# Patient Record
Sex: Male | Born: 1945 | Race: Black or African American | Hispanic: No | Marital: Single | Smoking: Former smoker
Health system: Southern US, Community
[De-identification: ages and names within clinical notes are randomized; demographics above are authoritative.]

## PROBLEM LIST (undated history)

## (undated) DIAGNOSIS — E119 Type 2 diabetes mellitus without complications: Secondary | ICD-10-CM

## (undated) DIAGNOSIS — E785 Hyperlipidemia, unspecified: Secondary | ICD-10-CM

## (undated) DIAGNOSIS — N189 Chronic kidney disease, unspecified: Secondary | ICD-10-CM

## (undated) DIAGNOSIS — G9340 Encephalopathy, unspecified: Secondary | ICD-10-CM

## (undated) DIAGNOSIS — F32A Depression, unspecified: Secondary | ICD-10-CM

## (undated) DIAGNOSIS — I10 Essential (primary) hypertension: Secondary | ICD-10-CM

## (undated) DIAGNOSIS — F329 Major depressive disorder, single episode, unspecified: Secondary | ICD-10-CM

---

## 2004-08-03 ENCOUNTER — Emergency Department (HOSPITAL_COMMUNITY): Admission: EM | Admit: 2004-08-03 | Discharge: 2004-08-03 | Payer: Self-pay | Admitting: Emergency Medicine

## 2013-04-09 ENCOUNTER — Emergency Department (INDEPENDENT_AMBULATORY_CARE_PROVIDER_SITE_OTHER): Payer: Medicare Other

## 2013-04-09 ENCOUNTER — Encounter (HOSPITAL_COMMUNITY): Payer: Self-pay | Admitting: Emergency Medicine

## 2013-04-09 ENCOUNTER — Emergency Department (HOSPITAL_COMMUNITY)
Admission: EM | Admit: 2013-04-09 | Discharge: 2013-04-09 | Disposition: A | Discharge status: Home / Self Care | Payer: Medicare Other | Source: Home / Self Care | Attending: Family Medicine | Admitting: Family Medicine

## 2013-04-09 DIAGNOSIS — S8000XA Contusion of unspecified knee, initial encounter: Secondary | ICD-10-CM

## 2013-04-09 DIAGNOSIS — S8002XA Contusion of left knee, initial encounter: Secondary | ICD-10-CM
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
## 2015-02-22 ENCOUNTER — Emergency Department (HOSPITAL_COMMUNITY): Payer: Medicare Other

## 2015-02-22 ENCOUNTER — Emergency Department (HOSPITAL_COMMUNITY)
Admission: EM | Admit: 2015-02-22 | Discharge: 2015-02-23 | Disposition: A | Discharge status: Home / Self Care | Payer: Medicare Other | Attending: Emergency Medicine | Admitting: Emergency Medicine

## 2015-02-22 ENCOUNTER — Encounter (HOSPITAL_COMMUNITY): Payer: Self-pay | Admitting: Emergency Medicine

## 2015-02-22 DIAGNOSIS — S2242XA Multiple fractures of ribs, left side, initial encounter for closed fracture: Secondary | ICD-10-CM | POA: Diagnosis not present

## 2015-02-22 DIAGNOSIS — Z791 Long term (current) use of non-steroidal anti-inflammatories (NSAID): Secondary | ICD-10-CM | POA: Diagnosis not present

## 2015-02-22 DIAGNOSIS — Y9389 Activity, other specified: Secondary | ICD-10-CM | POA: Insufficient documentation

## 2015-02-22 DIAGNOSIS — Y998 Other external cause status: Secondary | ICD-10-CM | POA: Diagnosis not present

## 2015-02-22 DIAGNOSIS — S29092A Other injury of muscle and tendon of back wall of thorax, initial encounter: Secondary | ICD-10-CM | POA: Diagnosis present

## 2015-02-22 DIAGNOSIS — Z79899 Other long term (current) drug therapy: Secondary | ICD-10-CM | POA: Diagnosis not present

## 2015-02-22 DIAGNOSIS — S2232XA Fracture of one rib, left side, initial encounter for closed fracture: Secondary | ICD-10-CM

## 2015-02-22 DIAGNOSIS — Y9289 Other specified places as the place of occurrence of the external cause: Secondary | ICD-10-CM | POA: Insufficient documentation

## 2015-02-22 LAB — CBC WITH DIFFERENTIAL/PLATELET
BASOS ABS: 0 10*3/uL (ref 0.0–0.1)
BASOS PCT: 0 % (ref 0–1)
Eosinophils Absolute: 0.1 10*3/uL (ref 0.0–0.7)
Eosinophils Relative: 1 % (ref 0–5)
HEMATOCRIT: 38.8 % — AB (ref 39.0–52.0)
HEMOGLOBIN: 12.5 g/dL — AB (ref 13.0–17.0)
LYMPHS PCT: 10 % — AB (ref 12–46)
Lymphs Abs: 1 10*3/uL (ref 0.7–4.0)
MCH: 29.5 pg (ref 26.0–34.0)
MCHC: 32.2 g/dL (ref 30.0–36.0)
MCV: 91.5 fL (ref 78.0–100.0)
MONO ABS: 0.7 10*3/uL (ref 0.1–1.0)
MONOS PCT: 7 % (ref 3–12)
NEUTROS ABS: 8.5 10*3/uL — AB (ref 1.7–7.7)
NEUTROS PCT: 82 % — AB (ref 43–77)
Platelets: 270 10*3/uL (ref 150–400)
RBC: 4.24 MIL/uL (ref 4.22–5.81)
RDW: 12.9 % (ref 11.5–15.5)
WBC: 10.3 10*3/uL (ref 4.0–10.5)

## 2015-02-22 MED ORDER — FENTANYL CITRATE (PF) 100 MCG/2ML IJ SOLN
50.0000 ug | Freq: Once | INTRAMUSCULAR | Status: AC
  Administered 2015-02-22: 50 ug via INTRAVENOUS
  Filled 2015-02-22: qty 2

## 2015-02-22 MED ORDER — ONDANSETRON HCL 4 MG/2ML IJ SOLN
4.0000 mg | Freq: Once | INTRAMUSCULAR | Status: AC
  Administered 2015-02-22: 4 mg via INTRAVENOUS
  Filled 2015-02-22: qty 2

## 2015-02-22 MED ORDER — IOHEXOL 300 MG/ML  SOLN: 50.0000 mL | Freq: Once | INTRAMUSCULAR | Status: DC | PRN

## 2015-02-22 MED ORDER — SODIUM CHLORIDE 0.9 % IV BOLUS (SEPSIS)
500.0000 mL | Freq: Once | INTRAVENOUS | Status: AC
  Administered 2015-02-22: 500 mL via INTRAVENOUS
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
## 2015-02-23 DIAGNOSIS — S2242XA Multiple fractures of ribs, left side, initial encounter for closed fracture: Secondary | ICD-10-CM | POA: Diagnosis not present

## 2015-02-23 LAB — URINALYSIS, ROUTINE W REFLEX MICROSCOPIC
BILIRUBIN URINE: NEGATIVE
Glucose, UA: 250 mg/dL — AB
HGB URINE DIPSTICK: NEGATIVE
KETONES UR: NEGATIVE mg/dL
Leukocytes, UA: NEGATIVE
Nitrite: NEGATIVE
PH: 5 (ref 5.0–8.0)
PROTEIN: NEGATIVE mg/dL
SPECIFIC GRAVITY, URINE: 1.028 (ref 1.005–1.030)
Urobilinogen, UA: 0.2 mg/dL (ref 0.0–1.0)

## 2015-02-23 LAB — COMPREHENSIVE METABOLIC PANEL
ALBUMIN: 4.3 g/dL (ref 3.5–5.0)
ALK PHOS: 37 U/L — AB (ref 38–126)
ALT: 12 U/L — ABNORMAL LOW (ref 17–63)
ANION GAP: 10 (ref 5–15)
AST: 24 U/L (ref 15–41)
BILIRUBIN TOTAL: 0.6 mg/dL (ref 0.3–1.2)
BUN: 14 mg/dL (ref 6–20)
CALCIUM: 9.3 mg/dL (ref 8.9–10.3)
CO2: 25 mmol/L (ref 22–32)
Chloride: 103 mmol/L (ref 101–111)
Creatinine, Ser: 1.23 mg/dL (ref 0.61–1.24)
GFR, EST NON AFRICAN AMERICAN: 59 mL/min — AB (ref >60)
GLUCOSE: 187 mg/dL — AB (ref 65–99)
Potassium: 4 mmol/L (ref 3.5–5.1)
Sodium: 138 mmol/L (ref 135–145)
Total Protein: 8.1 g/dL (ref 6.5–8.1)

## 2015-02-23 MED ORDER — IOHEXOL 300 MG/ML  SOLN
100.0000 mL | Freq: Once | INTRAMUSCULAR | Status: AC | PRN
  Administered 2015-02-23: 100 mL via INTRAVENOUS

## 2015-02-23 MED ORDER — HYDROCODONE-ACETAMINOPHEN 5-325 MG PO TABS
2.0000 | ORAL_TABLET | Freq: Once | ORAL | Status: AC
  Administered 2015-02-23: 2 via ORAL
  Filled 2015-02-23: qty 2

## 2015-02-23 MED ORDER — HYDROCODONE-ACETAMINOPHEN 5-325 MG PO TABS: 1.0000 | ORAL_TABLET | Freq: Four times a day (QID) | ORAL | Status: DC | PRN

## 2015-02-23 MED ORDER — FENTANYL CITRATE (PF) 100 MCG/2ML IJ SOLN
50.0000 ug | INTRAMUSCULAR | Status: DC | PRN
  Administered 2015-02-23: 50 ug via INTRAVENOUS
  Filled 2015-02-23: qty 2
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
## 2015-02-23 NOTE — Discharge Instructions (Signed)

## 2017-08-15 ENCOUNTER — Emergency Department (HOSPITAL_COMMUNITY)
Admission: EM | Admit: 2017-08-15 | Discharge: 2017-08-15 | Disposition: A | Discharge status: Home / Self Care | Payer: Medicare Other | Attending: Emergency Medicine | Admitting: Emergency Medicine

## 2017-08-15 ENCOUNTER — Emergency Department (HOSPITAL_COMMUNITY): Payer: Medicare Other

## 2017-08-15 DIAGNOSIS — Y999 Unspecified external cause status: Secondary | ICD-10-CM | POA: Insufficient documentation

## 2017-08-15 DIAGNOSIS — S8992XA Unspecified injury of left lower leg, initial encounter: Secondary | ICD-10-CM | POA: Diagnosis not present

## 2017-08-15 DIAGNOSIS — Y939 Activity, unspecified: Secondary | ICD-10-CM | POA: Diagnosis not present

## 2017-08-15 DIAGNOSIS — S8991XA Unspecified injury of right lower leg, initial encounter: Secondary | ICD-10-CM | POA: Diagnosis present

## 2017-08-15 DIAGNOSIS — S79912A Unspecified injury of left hip, initial encounter: Secondary | ICD-10-CM | POA: Diagnosis not present

## 2017-08-15 DIAGNOSIS — Y92410 Unspecified street and highway as the place of occurrence of the external cause: Secondary | ICD-10-CM | POA: Diagnosis not present

## 2017-08-15 DIAGNOSIS — M79606 Pain in leg, unspecified: Secondary | ICD-10-CM

## 2017-08-15 DIAGNOSIS — S79911A Unspecified injury of right hip, initial encounter: Secondary | ICD-10-CM | POA: Diagnosis not present

## 2017-08-15 DIAGNOSIS — W19XXXA Unspecified fall, initial encounter: Secondary | ICD-10-CM

## 2017-08-15 DIAGNOSIS — W010XXA Fall on same level from slipping, tripping and stumbling without subsequent striking against object, initial encounter: Secondary | ICD-10-CM | POA: Diagnosis not present

## 2017-08-15 LAB — CBG MONITORING, ED: GLUCOSE-CAPILLARY: 277 mg/dL — AB (ref 65–99)

## 2017-08-15 MED ORDER — ACETAMINOPHEN 500 MG PO TABS: 500.0000 mg | ORAL_TABLET | Freq: Four times a day (QID) | ORAL | 0 refills | Status: DC | PRN
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
## 2017-08-15 NOTE — ED Notes (Signed)
Patient transported to X-ray 
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
## 2017-08-15 NOTE — Discharge Instructions (Signed)
Your imaging was reassuring today and did not show any evidence of broken bone/fracture or dislocation.  We recommend icing to areas of pain to limit swelling.  Take Tylenol as needed for pain control.  You may follow-up with a primary care doctor to ensure resolution of symptoms.  Return to the emergency department, as needed, for new or concerning symptoms.

## 2017-09-02 ENCOUNTER — Encounter (HOSPITAL_COMMUNITY): Payer: Self-pay | Admitting: Emergency Medicine

## 2017-09-02 ENCOUNTER — Other Ambulatory Visit: Payer: Self-pay

## 2017-09-02 ENCOUNTER — Emergency Department (HOSPITAL_COMMUNITY)
Admission: EM | Admit: 2017-09-02 | Discharge: 2017-09-02 | Disposition: A | Discharge status: Home / Self Care | Payer: Medicare Other | Attending: Emergency Medicine | Admitting: Emergency Medicine

## 2017-09-02 DIAGNOSIS — S01511A Laceration without foreign body of lip, initial encounter: Secondary | ICD-10-CM | POA: Insufficient documentation

## 2017-09-02 DIAGNOSIS — Y939 Activity, unspecified: Secondary | ICD-10-CM | POA: Insufficient documentation

## 2017-09-02 DIAGNOSIS — Z7984 Long term (current) use of oral hypoglycemic drugs: Secondary | ICD-10-CM | POA: Insufficient documentation

## 2017-09-02 DIAGNOSIS — Y929 Unspecified place or not applicable: Secondary | ICD-10-CM | POA: Diagnosis not present

## 2017-09-02 DIAGNOSIS — W19XXXA Unspecified fall, initial encounter: Secondary | ICD-10-CM | POA: Diagnosis not present

## 2017-09-02 DIAGNOSIS — Z87891 Personal history of nicotine dependence: Secondary | ICD-10-CM | POA: Diagnosis not present

## 2017-09-02 DIAGNOSIS — Z79899 Other long term (current) drug therapy: Secondary | ICD-10-CM | POA: Insufficient documentation

## 2017-09-02 DIAGNOSIS — Y998 Other external cause status: Secondary | ICD-10-CM | POA: Diagnosis not present

## 2017-09-02 DIAGNOSIS — R739 Hyperglycemia, unspecified: Secondary | ICD-10-CM | POA: Diagnosis not present

## 2017-09-02 LAB — I-STAT CHEM 8, ED
BUN: 11 mg/dL (ref 6–20)
CREATININE: 1 mg/dL (ref 0.61–1.24)
Calcium, Ion: 1.11 mmol/L — ABNORMAL LOW (ref 1.15–1.40)
Chloride: 101 mmol/L (ref 101–111)
Glucose, Bld: 349 mg/dL — ABNORMAL HIGH (ref 65–99)
HEMATOCRIT: 40 % (ref 39.0–52.0)
Hemoglobin: 13.6 g/dL (ref 13.0–17.0)
Potassium: 3.7 mmol/L (ref 3.5–5.1)
SODIUM: 140 mmol/L (ref 135–145)
TCO2: 27 mmol/L (ref 22–32)

## 2017-09-02 LAB — CBG MONITORING, ED
Glucose-Capillary: 453 mg/dL — ABNORMAL HIGH (ref 65–99)
Glucose-Capillary: 332 mg/dL — ABNORMAL HIGH (ref 65–99)

## 2017-09-02 LAB — ETHANOL: Alcohol, Ethyl (B): <10 mg/dL (ref <10)

## 2017-09-02 MED ORDER — SODIUM CHLORIDE 0.9 % IV BOLUS (SEPSIS)
1000.0000 mL | Freq: Once | INTRAVENOUS | Status: AC
  Administered 2017-09-02: 1000 mL via INTRAVENOUS

## 2017-09-02 MED ORDER — ENALAPRILAT 1.25 MG/ML IV SOLN
1.2500 mg | Freq: Once | INTRAVENOUS | Status: AC
  Administered 2017-09-02: 1.25 mg via INTRAVENOUS
  Filled 2017-09-02: qty 1

## 2017-09-02 NOTE — Progress Notes (Signed)
CSW met with pt. Pt stated he has been staying at the [facility]. CSW called the [facility], they have no record of him staying there and have no available beds.   Wendelyn Breslow, [facility]  [phone]

## 2017-09-02 NOTE — ED Provider Notes (Signed)
[facility] EMERGENCY DEPARTMENT Provider Note   CSN: [id number] Arrival date & time: 09/02/17  4098     History   Chief Complaint Chief Complaint  Patient presents with  . Fall  . Lip Laceration  . Hyperglycemia    HPI Richard Powers is a 72 y.o. male.C hief complaint is fall, lip laceration.  HPI:  Pt fell outside of [facility] tonight. Recently evicted form apartment. Lip laceration. No additional injury. Pt likely not compliant with medications, as he is unable to outline dosages and meds for me.  History reviewed. No pertinent past medical history.  There are no active problems to display for this patient.   History reviewed. No pertinent surgical history.     Home Medications    Prior to Admission medications   Medication Sig Start Date End Date Taking? Authorizing Provider  acetaminophen (TYLENOL) 500 MG tablet Take 1 tablet (500 mg total) by mouth every 6 (six) hours as needed. 08/15/17  Yes Antony Madura, PA-C  hydrochlorothiazide (HYDRODIURIL) 25 MG tablet Take 25 mg by mouth daily.   Yes [provider]  ibuprofen (ADVIL,MOTRIN) 200 MG tablet Take 400 mg by mouth every 6 (six) hours as needed for headache, mild pain or moderate pain.   Yes [provider]  lisinopril (PRINIVIL,ZESTRIL) 40 MG tablet Take 40 mg by mouth daily.   Yes [provider]  metFORMIN (GLUCOPHAGE) 500 MG tablet Take 1,000 mg by mouth 2 (two) times daily with a meal.    Yes [provider]  nabumetone (RELAFEN) 750 MG tablet Take 750 mg by mouth 2 (two) times daily. 01/20/15  Yes [provider]  simvastatin (ZOCOR) 40 MG tablet Take 40 mg by mouth daily.   Yes [provider]  sitaGLIPtin (JANUVIA) 25 MG tablet Take 25 mg by mouth daily.   Yes [provider]    Family History History reviewed. No pertinent family history.  Social History Social History   Tobacco Use  . Smoking status: Former Games developer  .  Smokeless tobacco: Never Used  Substance Use Topics  . Alcohol use: Yes  . Drug use: No     Allergies   Patient has no known allergies.   Review of Systems Review of Systems  Constitutional: Negative for appetite change, chills, diaphoresis, fatigue and fever.  HENT: Negative for mouth sores, sore throat and trouble swallowing.   Eyes: Negative for visual disturbance.  Respiratory: Negative for cough, chest tightness, shortness of breath and wheezing.   Cardiovascular: Negative for chest pain.  Gastrointestinal: Negative for abdominal distention, abdominal pain, diarrhea, nausea and vomiting.  Endocrine: Negative for polydipsia, polyphagia and polyuria.  Genitourinary: Negative for dysuria, frequency and hematuria.  Musculoskeletal: Negative for gait problem.  Skin: Positive for wound. Negative for color change, pallor and rash.  Neurological: Negative for dizziness, syncope, light-headedness and headaches.  Hematological: Does not bruise/bleed easily.  Psychiatric/Behavioral: Negative for behavioral problems and confusion.     Physical Exam Updated Vital Signs BP (!) 194/88   Pulse (!) 53   Temp 99.4 F (37.4 C) (Oral)   Resp 18   Ht 5\' 7"  (1.702 m)   Wt 77.1 kg (170 lb)   SpO2 94%   BMI 26.63 kg/m   Physical Exam  Constitutional: He is oriented to person, place, and time. He appears well-developed and well-nourished. No distress.  HENT:  Head: Normocephalic.   Superficial upper and lower lip laceratios  Eyes: Conjunctivae are normal. Pupils are equal, round,  and reactive to light. No scleral icterus.  Neck: Normal range of motion. Neck supple. No thyromegaly present.  Cardiovascular: Normal rate and regular rhythm. Exam reveals no gallop and no friction rub.  No murmur heard. Pulmonary/Chest: Effort normal and breath sounds normal. No respiratory distress. He has no wheezes. He has no rales.  Abdominal: Soft. Bowel sounds are normal. He exhibits no distension.  There is no tenderness. There is no rebound.  Musculoskeletal: Normal range of motion.  Neurological: He is alert and oriented to person, place, and time.  Skin: Skin is warm and dry. No rash noted.  Psychiatric: He has a normal mood and affect. His behavior is normal.     ED Treatments / Results  Labs (all labs ordered are listed, but only abnormal results are displayed) Labs Reviewed  CBG MONITORING, ED - Abnormal; Notable for the following components:      Result Value   Glucose-Capillary 453 (*)    All other components within normal limits  I-STAT CHEM 8, ED - Abnormal; Notable for the following components:   Glucose, Bld 349 (*)    Calcium, Ion 1.11 (*)    All other components within normal limits  CBG MONITORING, ED - Abnormal; Notable for the following components:   Glucose-Capillary 332 (*)    All other components within normal limits  ETHANOL    EKG  EKG Interpretation None       Radiology No results found.  Procedures Procedures (including critical care time)  Medications Ordered in ED Medications  sodium chloride 0.9 % bolus 1,000 mL (0 mLs Intravenous Stopped 09/02/17 2100)  enalaprilat (VASOTEC) injection 1.25 mg (1.25 mg Intravenous Given 09/02/17 1948)     Initial Impression / Assessment and Plan / ED Course  I have reviewed the triage vital signs and the nursing notes.  Pertinent labs & imaging results that were available during my care of the patient were reviewed by me and considered in my medical decision making (see chart for details).    Hyperglycemic and hypertensive. Better after fluids and meds. Appropriate for Dc.  Final Clinical Impressions(s) / ED Diagnoses   Final diagnoses:  Lip laceration, initial encounter    ED Discharge Orders    None       Rolland PorterJames, Kiwanna Spraker, MD 09/02/17 2319

## 2017-09-02 NOTE — Progress Notes (Signed)
CSW met with pt. Pt called [facility]. [facility] accepted pt over the phone. CSW provided pt with a taxi voucher to return to [place].   Wendelyn Breslow, [facility]  [phone]

## 2017-09-02 NOTE — ED Notes (Signed)
Voicemail for emergency contact left for patient pick up in waiting room.

## 2017-09-02 NOTE — Discharge Instructions (Signed)
Continue your medications. Follow up with your physician
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
## 2017-09-03 ENCOUNTER — Emergency Department (HOSPITAL_COMMUNITY): Payer: Medicare Other

## 2017-09-03 ENCOUNTER — Encounter (HOSPITAL_COMMUNITY): Payer: Self-pay | Admitting: Emergency Medicine

## 2017-09-03 ENCOUNTER — Emergency Department (HOSPITAL_COMMUNITY)
Admission: EM | Admit: 2017-09-03 | Discharge: 2017-09-03 | Disposition: A | Discharge status: Home / Self Care | Payer: Medicare Other | Attending: Physician Assistant | Admitting: Physician Assistant

## 2017-09-03 DIAGNOSIS — Z79899 Other long term (current) drug therapy: Secondary | ICD-10-CM | POA: Diagnosis not present

## 2017-09-03 DIAGNOSIS — Y9389 Activity, other specified: Secondary | ICD-10-CM | POA: Insufficient documentation

## 2017-09-03 DIAGNOSIS — W19XXXA Unspecified fall, initial encounter: Secondary | ICD-10-CM

## 2017-09-03 DIAGNOSIS — R531 Weakness: Secondary | ICD-10-CM | POA: Insufficient documentation

## 2017-09-03 DIAGNOSIS — Y998 Other external cause status: Secondary | ICD-10-CM | POA: Insufficient documentation

## 2017-09-03 DIAGNOSIS — Z87891 Personal history of nicotine dependence: Secondary | ICD-10-CM | POA: Diagnosis not present

## 2017-09-03 DIAGNOSIS — W01198A Fall on same level from slipping, tripping and stumbling with subsequent striking against other object, initial encounter: Secondary | ICD-10-CM | POA: Diagnosis not present

## 2017-09-03 DIAGNOSIS — Y9289 Other specified places as the place of occurrence of the external cause: Secondary | ICD-10-CM | POA: Insufficient documentation

## 2017-09-03 LAB — CBC WITH DIFFERENTIAL/PLATELET
BASOS ABS: 0 10*3/uL (ref 0.0–0.1)
BASOS PCT: 0 %
Eosinophils Absolute: 0 10*3/uL (ref 0.0–0.7)
Eosinophils Relative: 0 %
HEMATOCRIT: 39.5 % (ref 39.0–52.0)
HEMOGLOBIN: 12.9 g/dL — AB (ref 13.0–17.0)
Lymphocytes Relative: 8 %
Lymphs Abs: 0.6 10*3/uL — ABNORMAL LOW (ref 0.7–4.0)
MCH: 28.9 pg (ref 26.0–34.0)
MCHC: 32.7 g/dL (ref 30.0–36.0)
MCV: 88.4 fL (ref 78.0–100.0)
Monocytes Absolute: 1 10*3/uL (ref 0.1–1.0)
Monocytes Relative: 13 %
NEUTROS ABS: 6.2 10*3/uL (ref 1.7–7.7)
NEUTROS PCT: 79 %
Platelets: 221 10*3/uL (ref 150–400)
RBC: 4.47 MIL/uL (ref 4.22–5.81)
RDW: 12.8 % (ref 11.5–15.5)
WBC: 7.9 10*3/uL (ref 4.0–10.5)

## 2017-09-03 LAB — COMPREHENSIVE METABOLIC PANEL
ALK PHOS: 50 U/L (ref 38–126)
ALT: 16 U/L — ABNORMAL LOW (ref 17–63)
ANION GAP: 8 (ref 5–15)
AST: 26 U/L (ref 15–41)
Albumin: 3.4 g/dL — ABNORMAL LOW (ref 3.5–5.0)
BILIRUBIN TOTAL: 0.9 mg/dL (ref 0.3–1.2)
BUN: 7 mg/dL (ref 6–20)
CALCIUM: 9.6 mg/dL (ref 8.9–10.3)
CO2: 25 mmol/L (ref 22–32)
Chloride: 104 mmol/L (ref 101–111)
Creatinine, Ser: 1.19 mg/dL (ref 0.61–1.24)
GFR, EST NON AFRICAN AMERICAN: 60 mL/min — AB (ref >60)
Glucose, Bld: 312 mg/dL — ABNORMAL HIGH (ref 65–99)
Potassium: 3.5 mmol/L (ref 3.5–5.1)
Sodium: 137 mmol/L (ref 135–145)
TOTAL PROTEIN: 6.6 g/dL (ref 6.5–8.1)

## 2017-09-03 LAB — CBG MONITORING, ED: Glucose-Capillary: 308 mg/dL — ABNORMAL HIGH (ref 65–99)

## 2017-09-03 NOTE — Discharge Instructions (Signed)
Please be careful walking around.  We are glad that your labs and CT are normal.
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
## 2017-09-03 NOTE — ED Triage Notes (Signed)
[facility] after being d/c from this facility by cab. Per EMS bystanders state pt fell twice attempting to walk from cab to doors and is unable to stand without falling back. Pt is A& O to norm.  EMS unable to est IV

## 2017-09-03 NOTE — ED Notes (Signed)
Pt ambulated with a steady gait and did not show any signs of unsteady gait. Ambulated without assistance to the bathroom without difficulty. Given a bus pass.

## 2017-09-05 ENCOUNTER — Encounter: Payer: Self-pay | Admitting: Pediatric Intensive Care

## 2017-09-09 ENCOUNTER — Encounter: Payer: Self-pay | Admitting: Pediatric Intensive Care

## 2017-09-09 ENCOUNTER — Emergency Department (HOSPITAL_COMMUNITY): Payer: Medicare Other

## 2017-09-09 ENCOUNTER — Encounter (HOSPITAL_COMMUNITY): Payer: Self-pay

## 2017-09-09 ENCOUNTER — Inpatient Hospital Stay (HOSPITAL_COMMUNITY): Payer: Medicare Other

## 2017-09-09 ENCOUNTER — Inpatient Hospital Stay (HOSPITAL_COMMUNITY)
Admission: EM | Admit: 2017-09-09 | Discharge: 2017-09-13 | DRG: 872 | Disposition: A | Discharge status: Home / Self Care | Payer: Medicare Other | Attending: Internal Medicine | Admitting: Internal Medicine

## 2017-09-09 DIAGNOSIS — Z7984 Long term (current) use of oral hypoglycemic drugs: Secondary | ICD-10-CM

## 2017-09-09 DIAGNOSIS — Z791 Long term (current) use of non-steroidal anti-inflammatories (NSAID): Secondary | ICD-10-CM | POA: Diagnosis not present

## 2017-09-09 DIAGNOSIS — E876 Hypokalemia: Secondary | ICD-10-CM | POA: Diagnosis not present

## 2017-09-09 DIAGNOSIS — M1990 Unspecified osteoarthritis, unspecified site: Secondary | ICD-10-CM | POA: Diagnosis not present

## 2017-09-09 DIAGNOSIS — E872 Acidosis: Secondary | ICD-10-CM | POA: Diagnosis present

## 2017-09-09 DIAGNOSIS — Z87891 Personal history of nicotine dependence: Secondary | ICD-10-CM | POA: Diagnosis not present

## 2017-09-09 DIAGNOSIS — Z590 Homelessness: Secondary | ICD-10-CM

## 2017-09-09 DIAGNOSIS — Z79899 Other long term (current) drug therapy: Secondary | ICD-10-CM

## 2017-09-09 DIAGNOSIS — A4189 Other specified sepsis: Principal | ICD-10-CM | POA: Diagnosis present

## 2017-09-09 DIAGNOSIS — E785 Hyperlipidemia, unspecified: Secondary | ICD-10-CM | POA: Diagnosis present

## 2017-09-09 DIAGNOSIS — E869 Volume depletion, unspecified: Secondary | ICD-10-CM | POA: Diagnosis present

## 2017-09-09 DIAGNOSIS — J101 Influenza due to other identified influenza virus with other respiratory manifestations: Secondary | ICD-10-CM | POA: Diagnosis not present

## 2017-09-09 DIAGNOSIS — E119 Type 2 diabetes mellitus without complications: Secondary | ICD-10-CM | POA: Diagnosis not present

## 2017-09-09 DIAGNOSIS — R6510 Systemic inflammatory response syndrome (SIRS) of non-infectious origin without acute organ dysfunction: Secondary | ICD-10-CM

## 2017-09-09 DIAGNOSIS — N179 Acute kidney failure, unspecified: Secondary | ICD-10-CM | POA: Diagnosis not present

## 2017-09-09 DIAGNOSIS — J208 Acute bronchitis due to other specified organisms: Secondary | ICD-10-CM | POA: Diagnosis present

## 2017-09-09 DIAGNOSIS — A419 Sepsis, unspecified organism: Secondary | ICD-10-CM | POA: Diagnosis present

## 2017-09-09 DIAGNOSIS — R69 Illness, unspecified: Secondary | ICD-10-CM

## 2017-09-09 DIAGNOSIS — I10 Essential (primary) hypertension: Secondary | ICD-10-CM

## 2017-09-09 DIAGNOSIS — E118 Type 2 diabetes mellitus with unspecified complications: Secondary | ICD-10-CM

## 2017-09-09 DIAGNOSIS — J111 Influenza due to unidentified influenza virus with other respiratory manifestations: Secondary | ICD-10-CM

## 2017-09-09 DIAGNOSIS — E1129 Type 2 diabetes mellitus with other diabetic kidney complication: Secondary | ICD-10-CM

## 2017-09-09 HISTORY — DX: Essential (primary) hypertension: I10

## 2017-09-09 HISTORY — DX: Type 2 diabetes mellitus without complications: E11.9

## 2017-09-09 LAB — I-STAT CG4 LACTIC ACID, ED
Lactic Acid, Venous: 3.09 mmol/L (ref 0.5–1.9)
Lactic Acid, Venous: 1.97 mmol/L — ABNORMAL HIGH (ref 0.5–1.9)

## 2017-09-09 LAB — URINALYSIS, ROUTINE W REFLEX MICROSCOPIC
Bilirubin Urine: NEGATIVE
KETONES UR: 5 mg/dL — AB
Leukocytes, UA: NEGATIVE
Nitrite: NEGATIVE
PROTEIN: 30 mg/dL — AB
Specific Gravity, Urine: 1.029 (ref 1.005–1.030)
pH: 5 (ref 5.0–8.0)

## 2017-09-09 LAB — CBC WITH DIFFERENTIAL/PLATELET
Basophils Absolute: 0 10*3/uL (ref 0.0–0.1)
Basophils Relative: 0 %
Eosinophils Absolute: 0 10*3/uL (ref 0.0–0.7)
Eosinophils Relative: 0 %
HCT: 37.5 % — ABNORMAL LOW (ref 39.0–52.0)
Hemoglobin: 12.3 g/dL — ABNORMAL LOW (ref 13.0–17.0)
LYMPHS ABS: 0.5 10*3/uL — AB (ref 0.7–4.0)
Lymphocytes Relative: 6 %
MCH: 29.1 pg (ref 26.0–34.0)
MCHC: 32.8 g/dL (ref 30.0–36.0)
MCV: 88.9 fL (ref 78.0–100.0)
MONO ABS: 0.5 10*3/uL (ref 0.1–1.0)
Monocytes Relative: 6 %
NEUTROS ABS: 7.7 10*3/uL (ref 1.7–7.7)
Neutrophils Relative %: 88 %
PLATELETS: ADEQUATE 10*3/uL (ref 150–400)
RBC: 4.22 MIL/uL (ref 4.22–5.81)
RDW: 12.6 % (ref 11.5–15.5)
WBC: 8.7 10*3/uL (ref 4.0–10.5)

## 2017-09-09 LAB — COMPREHENSIVE METABOLIC PANEL
ALK PHOS: 50 U/L (ref 38–126)
ALT: 15 U/L — ABNORMAL LOW (ref 17–63)
ANION GAP: 11 (ref 5–15)
AST: 26 U/L (ref 15–41)
Albumin: 3.1 g/dL — ABNORMAL LOW (ref 3.5–5.0)
BILIRUBIN TOTAL: 0.9 mg/dL (ref 0.3–1.2)
BUN: 14 mg/dL (ref 6–20)
CALCIUM: 8.7 mg/dL — AB (ref 8.9–10.3)
CO2: 24 mmol/L (ref 22–32)
CREATININE: 1.33 mg/dL — AB (ref 0.61–1.24)
Chloride: 100 mmol/L — ABNORMAL LOW (ref 101–111)
GFR calc non Af Amer: 52 mL/min — ABNORMAL LOW (ref >60)
Glucose, Bld: 357 mg/dL — ABNORMAL HIGH (ref 65–99)
Potassium: 3.9 mmol/L (ref 3.5–5.1)
Sodium: 135 mmol/L (ref 135–145)
TOTAL PROTEIN: 6.8 g/dL (ref 6.5–8.1)

## 2017-09-09 LAB — PROTIME-INR
INR: 1.33
Prothrombin Time: 16.3 seconds — ABNORMAL HIGH (ref 11.4–15.2)

## 2017-09-09 LAB — CBG MONITORING, ED: Glucose-Capillary: 364 mg/dL — ABNORMAL HIGH (ref 65–99)

## 2017-09-09 LAB — GLUCOSE, POCT (MANUAL RESULT ENTRY): POC GLUCOSE: 308 mg/dL — AB (ref 70–99)

## 2017-09-09 MED ORDER — ONDANSETRON HCL 4 MG PO TABS: 4.0000 mg | ORAL_TABLET | Freq: Four times a day (QID) | ORAL | Status: DC | PRN

## 2017-09-09 MED ORDER — SODIUM CHLORIDE 0.9 % IV BOLUS (SEPSIS)
1000.0000 mL | Freq: Once | INTRAVENOUS | Status: AC
  Administered 2017-09-09: 1000 mL via INTRAVENOUS

## 2017-09-09 MED ORDER — SODIUM CHLORIDE 0.9% FLUSH
3.0000 mL | Freq: Two times a day (BID) | INTRAVENOUS | Status: DC
  Administered 2017-09-10 – 2017-09-13 (×5): 3 mL via INTRAVENOUS

## 2017-09-09 MED ORDER — DEXTROSE 5 % IV SOLN
1.0000 g | INTRAVENOUS | Status: DC
  Administered 2017-09-10 – 2017-09-12 (×3): 1 g via INTRAVENOUS
  Filled 2017-09-09 (×4): qty 10

## 2017-09-09 MED ORDER — GUAIFENESIN ER 600 MG PO TB12
600.0000 mg | ORAL_TABLET | Freq: Two times a day (BID) | ORAL | Status: DC
  Administered 2017-09-09 – 2017-09-13 (×8): 600 mg via ORAL
  Filled 2017-09-09 (×8): qty 1

## 2017-09-09 MED ORDER — SIMVASTATIN 40 MG PO TABS
40.0000 mg | ORAL_TABLET | Freq: Every day | ORAL | Status: DC
  Administered 2017-09-10 (×2): 40 mg via ORAL
  Filled 2017-09-09 (×2): qty 1

## 2017-09-09 MED ORDER — DEXTROSE 5 % IV SOLN
500.0000 mg | INTRAVENOUS | Status: DC
  Administered 2017-09-09: 500 mg via INTRAVENOUS
  Filled 2017-09-09: qty 500

## 2017-09-09 MED ORDER — OSELTAMIVIR PHOSPHATE 75 MG PO CAPS
75.0000 mg | ORAL_CAPSULE | Freq: Once | ORAL | Status: AC
  Administered 2017-09-09: 75 mg via ORAL
  Filled 2017-09-09: qty 1

## 2017-09-09 MED ORDER — INSULIN ASPART 100 UNIT/ML IV SOLN: 10.0000 [IU] | Freq: Once | INTRAVENOUS | Status: DC

## 2017-09-09 MED ORDER — HYDROCHLOROTHIAZIDE 25 MG PO TABS
25.0000 mg | ORAL_TABLET | Freq: Every day | ORAL | Status: DC
  Administered 2017-09-10 – 2017-09-12 (×3): 25 mg via ORAL
  Filled 2017-09-09 (×3): qty 1

## 2017-09-09 MED ORDER — DEXTROSE 5 % IV SOLN: 500.0000 mg | Freq: Once | INTRAVENOUS | Status: DC

## 2017-09-09 MED ORDER — DEXTROSE 5 % IV SOLN: 1.0000 g | Freq: Once | INTRAVENOUS | Status: DC

## 2017-09-09 MED ORDER — SODIUM CHLORIDE 0.9 % IV SOLN
1000.0000 mL | INTRAVENOUS | Status: DC
  Administered 2017-09-09 – 2017-09-11 (×6): 1000 mL via INTRAVENOUS

## 2017-09-09 MED ORDER — DEXTROSE 5 % IV SOLN
1.0000 g | INTRAVENOUS | Status: DC
  Administered 2017-09-09: 1 g via INTRAVENOUS
  Filled 2017-09-09: qty 10

## 2017-09-09 MED ORDER — HYDRALAZINE HCL 20 MG/ML IJ SOLN
10.0000 mg | Freq: Three times a day (TID) | INTRAMUSCULAR | Status: DC | PRN
  Administered 2017-09-10: 10 mg via INTRAVENOUS

## 2017-09-09 MED ORDER — ACETAMINOPHEN 325 MG PO TABS
650.0000 mg | ORAL_TABLET | Freq: Once | ORAL | Status: AC
  Administered 2017-09-09: 650 mg via ORAL
  Filled 2017-09-09: qty 2

## 2017-09-09 MED ORDER — ONDANSETRON HCL 4 MG/2ML IJ SOLN: 4.0000 mg | Freq: Four times a day (QID) | INTRAMUSCULAR | Status: DC | PRN

## 2017-09-09 MED ORDER — AZITHROMYCIN 500 MG PO TABS
500.0000 mg | ORAL_TABLET | ORAL | Status: DC
  Administered 2017-09-10 – 2017-09-12 (×3): 500 mg via ORAL
  Filled 2017-09-09 (×3): qty 1

## 2017-09-09 MED ORDER — ALBUTEROL SULFATE (2.5 MG/3ML) 0.083% IN NEBU: 2.5000 mg | INHALATION_SOLUTION | RESPIRATORY_TRACT | Status: DC | PRN

## 2017-09-09 MED ORDER — DEXTROSE 5 % IV SOLN: 1.0000 g | INTRAVENOUS | Status: DC

## 2017-09-09 MED ORDER — INSULIN ASPART 100 UNIT/ML ~~LOC~~ SOLN
0.0000 [IU] | Freq: Three times a day (TID) | SUBCUTANEOUS | Status: DC
Start: 2017-09-10 — End: 2017-09-13
  Administered 2017-09-10 (×2): 3 [IU] via SUBCUTANEOUS
  Administered 2017-09-11: 2 [IU] via SUBCUTANEOUS
  Administered 2017-09-11: 3 [IU] via SUBCUTANEOUS
  Administered 2017-09-11: 5 [IU] via SUBCUTANEOUS
  Administered 2017-09-12: 8 [IU] via SUBCUTANEOUS
  Administered 2017-09-12 – 2017-09-13 (×2): 3 [IU] via SUBCUTANEOUS
  Administered 2017-09-13: 5 [IU] via SUBCUTANEOUS

## 2017-09-09 MED ORDER — ENOXAPARIN SODIUM 40 MG/0.4ML ~~LOC~~ SOLN
40.0000 mg | Freq: Every day | SUBCUTANEOUS | Status: DC
  Administered 2017-09-10 – 2017-09-13 (×4): 40 mg via SUBCUTANEOUS
  Filled 2017-09-09 (×4): qty 0.4

## 2017-09-09 MED ORDER — IPRATROPIUM-ALBUTEROL 0.5-2.5 (3) MG/3ML IN SOLN
3.0000 mL | Freq: Two times a day (BID) | RESPIRATORY_TRACT | Status: DC
  Administered 2017-09-09: 3 mL via RESPIRATORY_TRACT
  Filled 2017-09-09: qty 3

## 2017-09-09 MED ORDER — LISINOPRIL 40 MG PO TABS
40.0000 mg | ORAL_TABLET | Freq: Every day | ORAL | Status: DC
  Administered 2017-09-10 – 2017-09-13 (×4): 40 mg via ORAL
  Filled 2017-09-09 (×4): qty 1

## 2017-09-09 MED ORDER — ACETAMINOPHEN 650 MG RE SUPP: 650.0000 mg | Freq: Four times a day (QID) | RECTAL | Status: DC | PRN

## 2017-09-09 MED ORDER — ACETAMINOPHEN 325 MG PO TABS: 650.0000 mg | ORAL_TABLET | Freq: Four times a day (QID) | ORAL | Status: DC | PRN

## 2017-09-09 NOTE — ED Notes (Signed)
Delay in lab draw,  Pt not in room at this time. 

## 2017-09-09 NOTE — ED Triage Notes (Signed)
[facility]. Unaware of LNW. Pt was found by bystanders attempting to clean himself in the bathroom. Pt was  unable to stand up on his own.

## 2017-09-09 NOTE — H&P (Signed)
[facility] History and Physical  Gwendlyn DeutscherWilliam Yale ZOX:[id number] DOB: May 26, 1946 DOA: 09/09/2017  Referring physician:  PCP: System, Pcp Not In   Chief Complaint: "I just want to get well."    HPI: Gwendlyn DeutscherWilliam Manas is a 72 y.o. male with past medical history significant for hypertension diabetes presents to emergency room with chief complaint of feeling ill.  Patient was brought in by EMS after bystanders reported patient having upper behavior.  Very weak. Unable to stand.  ED Course: Sepsis protocol. Start on abx. Cultures drawn. Hospitalist consulted for admission.   Review of Systems:  As per HPI otherwise 10 point review of systems negative.    Past Medical History:  Diagnosis Date  . Diabetes mellitus without complication (HCC)   . Hypertension    History reviewed. No pertinent surgical history. Social History:  reports that he has quit smoking. he has never used smokeless tobacco. He reports that he drinks alcohol. He reports that he does not use drugs.  No Known Allergies  No family history on file.   Prior to Admission medications   Medication Sig Start Date End Date Taking? Authorizing Provider  acetaminophen (TYLENOL) 500 MG tablet Take 1 tablet (500 mg total) by mouth every 6 (six) hours as needed. 08/15/17   Antony MaduraHumes, Kelly, PA-C  hydrochlorothiazide (HYDRODIURIL) 25 MG tablet Take 25 mg by mouth daily.    [provider]  ibuprofen (ADVIL,MOTRIN) 200 MG tablet Take 400 mg by mouth every 6 (six) hours as needed for headache, mild pain or moderate pain.    [provider]  lisinopril (PRINIVIL,ZESTRIL) 40 MG tablet Take 40 mg by mouth daily.    [provider]  metFORMIN (GLUCOPHAGE) 500 MG tablet Take 1,000 mg by mouth 2 (two) times daily with a meal.     [provider]  nabumetone (RELAFEN) 750 MG tablet Take 750 mg by mouth 2 (two) times daily. 01/20/15   [provider]  simvastatin (ZOCOR) 40 MG tablet Take 40 mg by  mouth daily.    [provider]  sitaGLIPtin (JANUVIA) 25 MG tablet Take 25 mg by mouth daily.    [provider]   Physical Exam: Vitals:   09/09/17 2100 09/09/17 2115 09/09/17 2130 09/09/17 2132  BP:    (!) 152/114  Pulse: 75 (!) 124 79 (!) 127  Resp: 17 (!) 8 (!) 23 (!) 31  Temp:      TempSrc:      SpO2: 94% 94% 94% 95%  Weight:      Height:        Wt Readings from Last 3 Encounters:  09/09/17 79.4 kg (175 lb)  09/02/17 77.1 kg (170 lb)    General:  Appears calm and comfortable; A&Ox2 Eyes:  PERRL, EOMI, normal lids, iris ENT:  grossly normal hearing, lips & tongue Neck:  no LAD, masses or thyromegaly Cardiovascular:  RRR, no m/r/g. No LE edema.  Respiratory:  Crackles, sparse and diffuse, nl wob Abdomen:  soft, ntnd Skin:  no rash or induration seen on limited exam Musculoskeletal:  grossly normal tone BUE/BLE Psychiatric:  grossly normal mood and affect, speech fluent and appropriate Neurologic:  CN 2-12 grossly intact, moves all extremities in coordinated fashion.          Labs on Admission:  Basic Metabolic Panel: Recent Labs  Lab 09/03/17 0456 09/09/17 2000  NA 137 135  K 3.5 3.9  CL 104 100*  CO2 25 24  GLUCOSE 312* 357*  BUN 7  14  CREATININE 1.19 1.33*  CALCIUM 9.6 8.7*   Liver Function Tests: Recent Labs  Lab 09/03/17 0456 09/09/17 2000  AST 26 26  ALT 16* 15*  ALKPHOS 50 50  BILITOT 0.9 0.9  PROT 6.6 6.8  ALBUMIN 3.4* 3.1*   No results for input(s): LIPASE, AMYLASE in the last 168 hours. No results for input(s): AMMONIA in the last 168 hours. CBC: Recent Labs  Lab 09/03/17 0456 09/09/17 2000  WBC 7.9 8.7  NEUTROABS 6.2 7.7  HGB 12.9* 12.3*  HCT 39.5 37.5*  MCV 88.4 88.9  PLT 221 PLATELET CLUMPS NOTED ON SMEAR, COUNT APPEARS ADEQUATE   Cardiac Enzymes: No results for input(s): CKTOTAL, CKMB, CKMBINDEX, TROPONINI in the last 168 hours.  BNP (last 3 results) No results for input(s): BNP in the last 8760  hours.  ProBNP (last 3 results) No results for input(s): PROBNP in the last 8760 hours.   Serum creatinine: 1.33 mg/dL (H) 29/52/84 1324 Estimated creatinine clearance: 51.4 mL/min (A)  CBG: Recent Labs  Lab 09/03/17 0115  GLUCAP 308*    Radiological Exams on Admission: Dg Chest 2 View  Result Date: 09/09/2017 CLINICAL DATA:  Altered mental status and hypertension EXAM: CHEST  2 VIEW COMPARISON:  Rib x-ray February 22, 2015 FINDINGS: The aorta is tortuous and probably dilated. The heart size is mildly enlarged. Both lungs are clear. Old posttraumatic changes of multiple left ribs are identified. IMPRESSION: No active cardiopulmonary disease. Electronically Signed   By: Sherian Rein M.D.   On: 09/09/2017 20:38    EKG: Independently reviewed. Tachycardia. No STEMI.  Assessment/Plan Principal Problem:   Sepsis (HCC) Active Problems:   Essential hypertension   DM II (diabetes mellitus, type II), controlled (HCC)   Sepsis 2/2 unk resp Patient hemodynamically stable Given azithro emergency room, will continue Given rocephin in the emergency room, will continue Urine culture pending Blood cultures 2 pending Patient given 1000 mL of fluid in the emergency room Lactic acid up Flu pending  Hypertension When necessary hydralazine 10 mg IV as needed for severe blood pressure Cont HCTZ, lisinopril  DM SSI AC Hold metformin & januvia  OA Hold Nambumetone  Hyperlipidemia Continue statin   Code Status: FC  DVT Prophylaxis: lovenox Family Communication: none available Disposition Plan: Pending Improvement  Status: tele inpt  Haydee Salter, MD Family Medicine [facility] [email or web address] Password [email or web address]

## 2017-09-09 NOTE — ED Notes (Signed)
No IV access at this time.

## 2017-09-09 NOTE — ED Notes (Signed)
Two IV attempts made by this nurse

## 2017-09-09 NOTE — Progress Notes (Signed)
Pharmacy Antibiotic Note  Richard DeutscherWilliam Powers is a 72 y.o. male admitted on 09/09/2017 with pneumonia.  Pharmacy has been consulted for ceftriaxone/azithromycin dosing. Tmax  103.4.  Plan: Ceftriaxone 1g IV q24h Azithromycin 500mg  IV q24h F/u clinical progress, LOT Not renally adjusted - Rx will sign off consults     Temp (24hrs), Avg:103.4 F (39.7 C), Min:103.4 F (39.7 C), Max:103.4 F (39.7 C)  Recent Labs  Lab 09/02/17 2108 09/03/17 0456 09/09/17 2016  WBC  --  7.9  --   CREATININE 1.00 1.19  --   LATICACIDVEN  --   --  3.09*    Estimated Creatinine Clearance: 53.2 mL/min (by C-G formula based on SCr of 1.19 mg/dL).    No Known Allergies   Richard Powers, PharmD, BCPS Clinical Pharmacist 09/09/2017 8:26 PM

## 2017-09-09 NOTE — ED Notes (Signed)
Code Sepsis activated 2028  (RN aware)

## 2017-09-09 NOTE — ED Notes (Signed)
Patient transported to X-ray 

## 2017-09-09 NOTE — ED Notes (Signed)
Spoke to MD about pt CBG

## 2017-09-09 NOTE — ED Provider Notes (Signed)
Medical screening examination/treatment/procedure(s) were conducted as a shared visit with non-physician practitioner(s) and myself.  I personally evaluated the patient during the encounter.   EKG Interpretation  Date/Time:  Friday September 09 2017 19:51:30 EST Ventricular Rate:  130 PR Interval:    QRS Duration: 94 QT Interval:  327 QTC Calculation: 437 R Axis:   91 Text Interpretation:  Sinus tachycardia Ventricular bigeminy Right axis deviation Low voltage, precordial leads Borderline T abnormalities, inferior leads no previous Confirmed by Vanetta MuldersZackowski, Oseias Horsey [phone] on 09/09/2017 8:56:23 PM       Patient seen by me along with physician assistant.  [facility] in from homeless [facility].  For altered mental status.  Patient was found by bystanders attempting to clean himself in the bathroom.  Was able to stand on his own.  Patient febrile and tachycardic.  Patient was seen January fourth.  And also seen December 17.  Patient's vital signs consistent with concerns for sepsis.  Sepsis protocol initiated.  Patient tachycardic febrile to temp to 103.  Patient is confused but is alert.  Chest x-ray negative for pneumonia however clinically the concern is that may be the area of infection.  Patient started on sepsis protocol also got fluids and protocol antibiotics for pneumonia.  Patient's blood sugar was elevated but he will be getting IV fluids for the sepsis.  We will hold off on insulin at this time.   CRITICAL CARE Performed by: Vanetta MuldersZACKOWSKI,Joyice Magda Total critical care time: 30 minutes Critical care time was exclusive of separately billable procedures and treating other patients. Critical care was necessary to treat or prevent imminent or life-threatening deterioration. Critical care was time spent personally by me on the following activities: development of treatment plan with patient and/or surrogate as well as nursing, discussions with consultants, evaluation of patient's response to  treatment, examination of patient, obtaining history from patient or surrogate, ordering and performing treatments and interventions, ordering and review of laboratory studies, ordering and review of radiographic studies, pulse oximetry and re-evaluation of patient's condition.    Vanetta MuldersZackowski, Zamyiah Tino, MD 09/09/17 2108

## 2017-09-09 NOTE — ED Notes (Signed)
Two attempts made by this nurse

## 2017-09-09 NOTE — ED Notes (Signed)
Unsuccessful IV start x2.  

## 2017-09-09 NOTE — ED Provider Notes (Signed)
[facility]   CSN: [id number] Arrival date & time: 09/09/17  1931     History   Chief Complaint Chief Complaint  Patient presents with  . Altered Mental Status    HPI Richard Powers is a 72 y.o. male.  Level V caveat: AMS   Richard Powers is a 72 y.o. Male who presents to the ED via EMS with altered mental status.  Patient was found by bystanders in a bathroom where he was trying to clean himself.  He was unable to stand on his own.  Patient history.  He does tell me he has had a cough.  He tells me his pain where the nurse is currently putting in an IV.  He denies other complaints.  He denies any chest pain or abdominal pain.  He denies any vomiting or diarrhea.  He is unsure if he has had fevers.   The history is provided by the patient, the EMS personnel and medical records. No language interpreter was used.  Altered Mental Status      Past Medical History:  Diagnosis Date  . Diabetes mellitus without complication (HCC)   . Hypertension     Patient Active Problem List   Diagnosis Date Noted  . Sepsis (HCC) 09/09/2017  . Essential hypertension 09/09/2017  . DM II (diabetes mellitus, type II), controlled (HCC) 09/09/2017    History reviewed. No pertinent surgical history.     Home Medications    Prior to Admission medications   Medication Sig Start Date End Date Taking? Authorizing Provider  acetaminophen (TYLENOL) 500 MG tablet Take 1 tablet (500 mg total) by mouth every 6 (six) hours as needed. 08/15/17  Yes Antony MaduraHumes, Kelly, PA-C  hydrochlorothiazide (HYDRODIURIL) 25 MG tablet Take 25 mg by mouth daily.   Yes [provider]  ibuprofen (ADVIL,MOTRIN) 200 MG tablet Take 400 mg by mouth every 6 (six) hours as needed for headache, mild pain or moderate pain.   Yes [provider]  lisinopril (PRINIVIL,ZESTRIL) 40 MG tablet Take 40 mg by mouth daily.   Yes [provider]  metFORMIN  (GLUCOPHAGE) 500 MG tablet Take 1,000 mg by mouth 2 (two) times daily with a meal.    Yes [provider]  nabumetone (RELAFEN) 750 MG tablet Take 750 mg by mouth 2 (two) times daily. 01/20/15  Yes [provider]  simvastatin (ZOCOR) 40 MG tablet Take 40 mg by mouth daily.   Yes [provider]  sitaGLIPtin (JANUVIA) 25 MG tablet Take 25 mg by mouth daily.   Yes [provider]    Family History No family history on file.  Social History Social History   Tobacco Use  . Smoking status: Former Games developermoker  . Smokeless tobacco: Never Used  Substance Use Topics  . Alcohol use: Yes  . Drug use: No     Allergies   Patient has no known allergies.   Review of Systems Review of Systems  Unable to perform ROS: Mental status change     Physical Exam Updated Vital Signs BP (!) 148/88   Pulse (!) 55   Temp 99.1 F (37.3 C) (Oral)   Resp (!) 22   Ht 5\' 7"  (1.702 m)   Wt 79.4 kg (175 lb)   SpO2 94%   BMI 27.41 kg/m   Physical Exam  Constitutional: He appears well-developed and well-nourished. No distress.  HENT:  Head: Normocephalic and atraumatic.  Mouth/Throat: Oropharynx is clear and moist.  Eyes: Conjunctivae are normal. Pupils are equal, round, and reactive to light. Right eye exhibits no discharge. Left eye exhibits no discharge.  Neck: Neck supple.  Cardiovascular: Regular rhythm, normal heart sounds and intact distal pulses. Exam reveals no gallop and no friction rub.  No murmur heard. HR 124 on exam.   Pulmonary/Chest: Effort normal. No respiratory distress. He has no wheezes.  Crackles and diminished to bilateral bases. Coughing during exam. No increased work of breathing.   Abdominal: Soft. He exhibits no mass. There is no tenderness. There is no guarding.  Abdomen is soft and non-tender to palpation.   Musculoskeletal: He exhibits no edema.  No LE edema or TTP.   Lymphadenopathy:    He has no cervical adenopathy.  Neurological:  He is alert. Coordination normal.  Patient alert and oriented to person. His speech is clear and coherent. He is able to tell me the correct president, but keeps repeating his date of birth when I ask him the month and year. He is unsure where he is. Patient is spontaneously moving all extremities in a coordinated fashion exhibiting good strength.   Skin: Skin is warm and dry. Capillary refill takes less than 2 seconds. No rash noted. He is not diaphoretic. No erythema. No pallor.  Psychiatric: He has a normal mood and affect. His behavior is normal.  Nursing note and vitals reviewed.    ED Treatments / Results  Labs (all labs ordered are listed, but only abnormal results are displayed) Labs Reviewed  COMPREHENSIVE METABOLIC PANEL - Abnormal; Notable for the following components:      Result Value   Chloride 100 (*)    Glucose, Bld 357 (*)    Creatinine, Ser 1.33 (*)    Calcium 8.7 (*)    Albumin 3.1 (*)    ALT 15 (*)    GFR calc non Af Amer 52 (*)    All other components within normal limits  CBC WITH DIFFERENTIAL/PLATELET - Abnormal; Notable for the following components:   Hemoglobin 12.3 (*)    HCT 37.5 (*)    Lymphs Abs 0.5 (*)    All other components within normal limits  PROTIME-INR - Abnormal; Notable for the following components:   Prothrombin Time 16.3 (*)    All other components within normal limits  URINALYSIS, ROUTINE W REFLEX MICROSCOPIC - Abnormal; Notable for the following components:   Glucose, UA >=500 (*)    Hgb urine dipstick MODERATE (*)    Ketones, ur 5 (*)    Protein, ur 30 (*)    Bacteria, UA RARE (*)    Squamous Epithelial / LPF 0-5 (*)    All other components within normal limits  I-STAT CG4 LACTIC ACID, ED - Abnormal; Notable for the following components:   Lactic Acid, Venous 3.09 (*)    All other components within normal limits  I-STAT CG4 LACTIC ACID, ED - Abnormal; Notable for the following components:   Lactic Acid, Venous 1.97 (*)    All  other components within normal limits  CULTURE, BLOOD (ROUTINE X 2)  CULTURE, BLOOD (ROUTINE X 2)  INFLUENZA PANEL BY PCR (TYPE A & B)  LACTIC ACID, PLASMA  LACTIC ACID, PLASMA    EKG  EKG Interpretation  Date/Time:  Friday September 09 2017 19:51:30 EST Ventricular Rate:  130 PR Interval:    QRS Duration: 94 QT Interval:  327 QTC Calculation: 437 R Axis:   91 Text Interpretation:  Sinus tachycardia Ventricular bigeminy Right axis deviation Low  voltage, precordial leads Borderline T abnormalities, inferior leads no previous Confirmed by Vanetta Mulders [phone]) on 09/09/2017 8:56:23 PM       Radiology Dg Chest 2 View  Result Date: 09/09/2017 CLINICAL DATA:  Altered mental status and hypertension EXAM: CHEST  2 VIEW COMPARISON:  Rib x-ray February 22, 2015 FINDINGS: The aorta is tortuous and probably dilated. The heart size is mildly enlarged. Both lungs are clear. Old posttraumatic changes of multiple left ribs are identified. IMPRESSION: No active cardiopulmonary disease. Electronically Signed   By: Sherian Rein M.D.   On: 09/09/2017 20:38    Procedures Procedures (including critical care time)  CRITICAL CARE Performed by: Lawana Chambers   Total critical care time: 35  minutes  Critical care time was exclusive of separately billable procedures and treating other patients.  Critical care was necessary to treat or prevent imminent or life-threatening deterioration.  Critical care was time spent personally by me on the following activities: development of treatment plan with patient and/or surrogate as well as nursing, discussions with consultants, evaluation of patient's response to treatment, examination of patient, obtaining history from patient or surrogate, ordering and performing treatments and interventions, ordering and review of laboratory studies, ordering and review of radiographic studies, pulse oximetry and re-evaluation of patient's condition.   Medications  Ordered in ED Medications  0.9 %  sodium chloride infusion (1,000 mLs Intravenous New Bag/Given 09/09/17 2137)  azithromycin (ZITHROMAX) 500 mg in dextrose 5 % 250 mL IVPB (500 mg Intravenous New Bag/Given 09/09/17 2127)  cefTRIAXone (ROCEPHIN) 1 g in dextrose 5 % 50 mL IVPB (0 g Intravenous Stopped 09/09/17 2230)  oseltamivir (TAMIFLU) capsule 75 mg (not administered)  ipratropium-albuterol (DUONEB) 0.5-2.5 (3) MG/3ML nebulizer solution 3 mL (not administered)  albuterol (PROVENTIL) (2.5 MG/3ML) 0.083% nebulizer solution 2.5 mg (not administered)  guaiFENesin (MUCINEX) 12 hr tablet 600 mg (not administered)  hydrALAZINE (APRESOLINE) injection 10 mg (not administered)  insulin aspart (novoLOG) injection 0-15 Units (not administered)  sodium chloride 0.9 % bolus 1,000 mL (0 mLs Intravenous Stopped 09/09/17 2230)  acetaminophen (TYLENOL) tablet 650 mg (650 mg Oral Given 09/09/17 2044)     Initial Impression / Assessment and Plan / ED Course  I have reviewed the triage vital signs and the nursing notes.  Pertinent labs & imaging results that were available during my care of the patient were reviewed by me and considered in my medical decision making (see chart for details).    This is a 72 y.o. Male who presents to the ED via EMS with altered mental status.  Patient was found by bystanders in a bathroom where he was trying to clean himself.  He was unable to stand on his own.  Patient history.  He does tell me he has had a cough.  He tells me his pain where the nurse is currently putting in an IV.  He denies other complaints.  He denies any chest pain or abdominal pain.  He denies any vomiting or diarrhea.  He is unsure if he has had fevers.  On exam the patient is nontoxic-appearing.  Temperature is 103.  He is tachycardic with a heart rate of 124 on my exam.  Diminished lung sounds bilaterally with crackles bilaterally.  No increased work of breathing.  No hypoxia.  His abdomen is soft and  nontender.  His speech is clear and coherent.  He is confused about the date and events leading up to his visit today. Code sepsis activated on my initial evaluation.  Lactic acid is 3.09 initially. Suspect pneumonia with his coughing versus influenza illness.  Flu by PCR ordered.  Will initiate community-acquired pneumonia treatment with Rocephin and azithromycin at this time.  Awaiting additional blood work and chest x-ray. CMP is remarkable for hyperglycemia with a glucose of 357.  Normal anion gap.  Creatinine of 1.33. Urinalysis is nitrite and leukocyte negative.  Low suspicion for UTI. CBC with a white count of 8700.  Chest x-ray is unremarkable.  Question influenza.  Will start Tamiflu.  Plan for admission. At reevaluation patient's heart rate is improving its down to 118 on reevaluation.  I explained to patient that he would be coming into the hospital. Will consult for admission  I consulted with Dr. Melynda Ripple from [facility] who accepted the patient for admission.   This patient was discussed with and evaluated by Dr. Deretha Emory who agrees with assessment and plan.   Final Clinical Impressions(s) / ED Diagnoses   Final diagnoses:  Sepsis, due to unspecified organism [facility])  Influenza-like illness    ED Discharge Orders    None       Amiel, Mccaffrey, PA-C 09/09/17 2236    Vanetta Mulders, MD 09/09/17 2350

## 2017-09-10 DIAGNOSIS — E869 Volume depletion, unspecified: Secondary | ICD-10-CM | POA: Diagnosis not present

## 2017-09-10 DIAGNOSIS — R6510 Systemic inflammatory response syndrome (SIRS) of non-infectious origin without acute organ dysfunction: Secondary | ICD-10-CM | POA: Diagnosis not present

## 2017-09-10 DIAGNOSIS — A4189 Other specified sepsis: Secondary | ICD-10-CM | POA: Diagnosis not present

## 2017-09-10 DIAGNOSIS — N179 Acute kidney failure, unspecified: Secondary | ICD-10-CM | POA: Diagnosis not present

## 2017-09-10 DIAGNOSIS — A419 Sepsis, unspecified organism: Secondary | ICD-10-CM | POA: Diagnosis not present

## 2017-09-10 DIAGNOSIS — R69 Illness, unspecified: Secondary | ICD-10-CM | POA: Diagnosis not present

## 2017-09-10 LAB — CBC
HCT: 36.9 % — ABNORMAL LOW (ref 39.0–52.0)
Hemoglobin: 12.3 g/dL — ABNORMAL LOW (ref 13.0–17.0)
MCH: 29.8 pg (ref 26.0–34.0)
MCHC: 33.3 g/dL (ref 30.0–36.0)
MCV: 89.3 fL (ref 78.0–100.0)
PLATELETS: 226 10*3/uL (ref 150–400)
RBC: 4.13 MIL/uL — ABNORMAL LOW (ref 4.22–5.81)
RDW: 12.5 % (ref 11.5–15.5)
WBC: 9.1 10*3/uL (ref 4.0–10.5)

## 2017-09-10 LAB — BASIC METABOLIC PANEL
Anion gap: 10 (ref 5–15)
BUN: 11 mg/dL (ref 6–20)
CO2: 25 mmol/L (ref 22–32)
CREATININE: 1.18 mg/dL (ref 0.61–1.24)
Calcium: 8.1 mg/dL — ABNORMAL LOW (ref 8.9–10.3)
Chloride: 102 mmol/L (ref 101–111)
GLUCOSE: 302 mg/dL — AB (ref 65–99)
Potassium: 3.4 mmol/L — ABNORMAL LOW (ref 3.5–5.1)
Sodium: 137 mmol/L (ref 135–145)

## 2017-09-10 LAB — INFLUENZA PANEL BY PCR (TYPE A & B)
INFLAPCR: POSITIVE — AB
INFLBPCR: NEGATIVE

## 2017-09-10 LAB — GLUCOSE, CAPILLARY
GLUCOSE-CAPILLARY: 154 mg/dL — AB (ref 65–99)
GLUCOSE-CAPILLARY: 385 mg/dL — AB (ref 65–99)
Glucose-Capillary: 116 mg/dL — ABNORMAL HIGH (ref 65–99)
Glucose-Capillary: 151 mg/dL — ABNORMAL HIGH (ref 65–99)

## 2017-09-10 LAB — STREP PNEUMONIAE URINARY ANTIGEN: Strep Pneumo Urinary Antigen: NEGATIVE

## 2017-09-10 LAB — PROCALCITONIN: Procalcitonin: 8.74 ng/mL

## 2017-09-10 LAB — LACTIC ACID, PLASMA
LACTIC ACID, VENOUS: 2.6 mmol/L — AB (ref 0.5–1.9)
Lactic Acid, Venous: 2.1 mmol/L (ref 0.5–1.9)

## 2017-09-10 MED ORDER — OSELTAMIVIR PHOSPHATE 75 MG PO CAPS
75.0000 mg | ORAL_CAPSULE | Freq: Two times a day (BID) | ORAL | Status: DC
  Administered 2017-09-10 – 2017-09-13 (×7): 75 mg via ORAL
  Filled 2017-09-10 (×4): qty 1
  Filled 2017-09-10: qty 2
  Filled 2017-09-10 (×2): qty 1

## 2017-09-10 MED ORDER — SODIUM CHLORIDE 0.9 % IV BOLUS (SEPSIS)
500.0000 mL | Freq: Once | INTRAVENOUS | Status: AC
  Administered 2017-09-10: 500 mL via INTRAVENOUS

## 2017-09-10 MED ORDER — IPRATROPIUM-ALBUTEROL 0.5-2.5 (3) MG/3ML IN SOLN
3.0000 mL | Freq: Two times a day (BID) | RESPIRATORY_TRACT | Status: DC
  Administered 2017-09-10 – 2017-09-11 (×3): 3 mL via RESPIRATORY_TRACT
  Filled 2017-09-10 (×3): qty 3

## 2017-09-10 MED ORDER — POTASSIUM CHLORIDE CRYS ER 20 MEQ PO TBCR
40.0000 meq | EXTENDED_RELEASE_TABLET | Freq: Once | ORAL | Status: AC
  Administered 2017-09-10: 40 meq via ORAL
  Filled 2017-09-10: qty 2

## 2017-09-10 MED ORDER — INSULIN ASPART 100 UNIT/ML ~~LOC~~ SOLN
10.0000 [IU] | Freq: Once | SUBCUTANEOUS | Status: AC
  Administered 2017-09-10: 10 [IU] via SUBCUTANEOUS

## 2017-09-10 MED ORDER — OSELTAMIVIR PHOSPHATE 30 MG PO CAPS
30.0000 mg | ORAL_CAPSULE | Freq: Two times a day (BID) | ORAL | Status: DC
  Filled 2017-09-10: qty 1

## 2017-09-10 NOTE — Progress Notes (Signed)
CRITICAL VALUE ALERT  Critical Value:  Lactic Acid 2.1   Date & Time Notied:  09/10/17   Provider Notified: Bodenhiemer  Orders Received/Actions taken: 500 ml bolus

## 2017-09-10 NOTE — Progress Notes (Signed)
PROGRESS NOTE    Richard Powers  YNW:[id number] DOB: 1946-08-19 DOA: 09/09/2017 PCP: System, Pcp Not In  Outpatient Specialists:    Brief Narrative: Patient is a 72 y.o. male with past medical history significant for hypertension and diabetes mellitus. Patient is a very poor historian. Patient was admitted with SIRS/Sepsis (as per documentation), and influenza is noted to be positive. Patient was volume deplete on admission, with associated AKI, likely pre renal that has resolved with hydration. Patient has been started on Tamiflu.   Assessment & Plan:   Principal Problem:   Sepsis/SIRS   Influenza A Active Problems:   Volume depletion   AKI, likely pre renal   Essential hypertension   DM II (diabetes mellitus, type II), controlled (HCC)   Hypokalemia   Poor historian   Tamiflu Complete work up Continue antibiotics IV Hydration Monitor electrolyte and renal function Replete potassium   DVT prophylaxis: [place] Lovenox Code Status: Full Family Communication:  Disposition Plan: Home eventually  Consultants:   None  Procedures:   None  Antimicrobials:   Tamiflu  Rocephin  Azithromycin    Subjective: Poor historian  Objective: Vitals:   09/10/17 0946 09/10/17 1605 09/10/17 1816 09/10/17 2026  BP:  (!) 161/77 (!) 182/91   Pulse:  70    Resp:  18    Temp:  98.9 F (37.2 C)    TempSrc:  Oral    SpO2: 94% 100%  99%  Weight:      Height:        Intake/Output Summary (Last 24 hours) at 09/10/2017 2117 Last data filed at 09/10/2017 2053 Gross per 24 hour  Intake 2726.25 ml  Output 1800 ml  Net 926.25 ml   Filed Weights   09/09/17 2039 09/10/17 0603  Weight: 79.4 kg (175 lb) 73.4 kg (161 lb 13.1 oz)    Examination:  General exam: Appears calm and comfortable  Respiratory system: Clear to auscultation.   Cardiovascular system: S1 & S2. Gastrointestinal system: Abdomen is nondistended, soft and nontender. No organomegaly or masses felt. Normal bowel  sounds heard. Central nervous system: Alert and oriented. No focal neurological deficits. Extremities: Symmetric 5 x 5 power.  Data Reviewed: I have personally reviewed following labs and imaging studies  CBC: Recent Labs  Lab 09/09/17 2000 09/10/17 0216  WBC 8.7 9.1  NEUTROABS 7.7  --   HGB 12.3* 12.3*  HCT 37.5* 36.9*  MCV 88.9 89.3  PLT PLATELET CLUMPS NOTED ON SMEAR, COUNT APPEARS ADEQUATE 226   Basic Metabolic Panel: Recent Labs  Lab 09/09/17 2000 09/10/17 0216  NA 135 137  K 3.9 3.4*  CL 100* 102  CO2 24 25  GLUCOSE 357* 302*  BUN 14 11  CREATININE 1.33* 1.18  CALCIUM 8.7* 8.1*   GFR: Estimated Creatinine Clearance: 53.7 mL/min (by C-G formula based on SCr of 1.18 mg/dL). Liver Function Tests: Recent Labs  Lab 09/09/17 2000  AST 26  ALT 15*  ALKPHOS 50  BILITOT 0.9  PROT 6.8  ALBUMIN 3.1*   No results for input(s): LIPASE, AMYLASE in the last 168 hours. No results for input(s): AMMONIA in the last 168 hours. Coagulation Profile: Recent Labs  Lab 09/09/17 2000  INR 1.33   Cardiac Enzymes: No results for input(s): CKTOTAL, CKMB, CKMBINDEX, TROPONINI in the last 168 hours. BNP (last 3 results) No results for input(s): PROBNP in the last 8760 hours. HbA1C: No results for input(s): HGBA1C in the last 72 hours. CBG: Recent Labs  Lab 09/09/17 2233 09/10/17  0041 09/10/17 0742 09/10/17 1226 09/10/17 1639  GLUCAP 364* 385* 116* 154* 151*   Lipid Profile: No results for input(s): CHOL, HDL, LDLCALC, TRIG, CHOLHDL, LDLDIRECT in the last 72 hours. Thyroid Function Tests: No results for input(s): TSH, T4TOTAL, FREET4, T3FREE, THYROIDAB in the last 72 hours. Anemia Panel: No results for input(s): VITAMINB12, FOLATE, FERRITIN, TIBC, IRON, RETICCTPCT in the last 72 hours. Urine analysis:    Component Value Date/Time   COLORURINE YELLOW 09/09/2017 2008   APPEARANCEUR CLEAR 09/09/2017 2008   LABSPEC 1.029 09/09/2017 2008   PHURINE 5.0 09/09/2017  2008   GLUCOSEU >=500 (A) 09/09/2017 2008   HGBUR MODERATE (A) 09/09/2017 2008   BILIRUBINUR NEGATIVE 09/09/2017 2008   KETONESUR 5 (A) 09/09/2017 2008   PROTEINUR 30 (A) 09/09/2017 2008   UROBILINOGEN 0.2 02/22/2015 2331   NITRITE NEGATIVE 09/09/2017 2008   LEUKOCYTESUR NEGATIVE 09/09/2017 2008   Sepsis Labs: @LABRCNTIP (procalcitonin:4,lacticidven:4)  ) Recent Results (from the past 240 hour(s))  Culture, blood (Routine x 2)     Status: None (Preliminary result)   Collection Time: 09/09/17  8:21 PM  Result Value Ref Range Status   Specimen Description BLOOD BLOOD RIGHT HAND  Final   Special Requests AEROBIC BOTTLE ONLY Blood Culture adequate volume  Final   Culture NO GROWTH < 24 HOURS  Final   Report Status PENDING  Incomplete  Culture, blood (Routine x 2)     Status: None (Preliminary result)   Collection Time: 09/09/17  9:15 PM  Result Value Ref Range Status   Specimen Description BLOOD BLOOD RIGHT HAND  Final   Special Requests AEROBIC BOTTLE ONLY Blood Culture adequate volume  Final   Culture NO GROWTH < 24 HOURS  Final   Report Status PENDING  Incomplete         Radiology Studies: Dg Chest 2 View  Result Date: 09/09/2017 CLINICAL DATA:  Altered mental status and hypertension EXAM: CHEST  2 VIEW COMPARISON:  Rib x-ray February 22, 2015 FINDINGS: The aorta is tortuous and probably dilated. The heart size is mildly enlarged. Both lungs are clear. Old posttraumatic changes of multiple left ribs are identified. IMPRESSION: No active cardiopulmonary disease. Electronically Signed   By: Sherian Rein M.D.   On: 09/09/2017 20:38   Ct Head Wo Contrast  Result Date: 09/10/2017 CLINICAL DATA:  Acute confusion EXAM: CT HEAD WITHOUT CONTRAST TECHNIQUE: Contiguous axial images were obtained from the base of the skull through the vertex without intravenous contrast. COMPARISON:  09/03/2017 FINDINGS: Brain: No acute territorial infarction, hemorrhage or intracranial mass is visualized.  Mild to moderate atrophy. Fairly extensive small vessel ischemic changes of the white matter. 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 Vascular: No hyperdense vessels.  Carotid artery calcification Skull: Normal. Negative for fracture or focal lesion. Sinuses/Orbits: Mucosal thickening in the ethmoid and maxillary sinuses. No acute orbital abnormality. Other: None IMPRESSION: No CT evidence for acute intracranial abnormality. Atrophy and small vessel ischemic changes of the white matter Electronically Signed   By: Jasmine Pang M.D.   On: 09/10/2017 00:06        Scheduled Meds: . azithromycin  500 mg Oral Q24H  . enoxaparin (LOVENOX) injection  40 mg Subcutaneous Daily  . guaiFENesin  600 mg Oral BID  . hydrochlorothiazide  25 mg Oral Daily  . insulin aspart  0-15 Units Subcutaneous TID WC  . ipratropium-albuterol  3 mL Nebulization BID  . lisinopril  40 mg Oral Daily  . oseltamivir  75 mg Oral BID  .  simvastatin  40 mg Oral QHS  . sodium chloride flush  3 mL Intravenous Q12H   Continuous Infusions: . sodium chloride 1,000 mL (09/10/17 1835)  . cefTRIAXone (ROCEPHIN)  IV 1 g (09/10/17 2055)     LOS: 1 day    Time spent: 1840 Minutes    Berton MountSylvester Daleysa Kristiansen, MD  [facility] Pager #: [phone] 7PM-7AM contact night coverage as above

## 2017-09-10 NOTE — Progress Notes (Addendum)
Met with patient to discuss if patient would be interested in participating in a clinical research study. Patient eligibilty for study currently under review. Discussed study with patient and consent left at bedside.   Rock, Wells Guiles A  Title: A Randomized, Double-Blind, Placebo-Controlled Dose Ranging Study Evaluating the Safety Pharmacokinetics and Clinical Benefit of FLU-IGIV in Hospitalized Patients with Serious Influenza A infection. IA-001 (ClinicalTrials.gov Identifier: [id number], Protocol No: IA-001, [facility] Protocol #[id number])  RESEARCH SUBJECT. This research study is sponsored by [employer]   Protocol: amendment 4 -> 20 Jan 2017  The investigational product is called NP-025 aka FLU-IGIV or anti-influenza immune globulin intravenous. It is produced from source plasma collected from [place] Transport planner (FDA) licensed plasma collection establishments from healthy donors who have recovered from influenza (convalescent) and/or were vaccinated against seasonal influenza strains. The plasma contains a relatively high concentration of polyclonal antibodies directed against seasonal influenza strains, specifically influenza A strains H1N1 ([place]) and H3N2 ([place]). It is a glycoprotein of 150-160 kilodaltons against Hemagluttinin (HA) and Neuraminidase (NA) surface proteins.    ................ PI OVERSIGHT ATTESTATION  I the Principal Investigator (PI) for the above mentioned study attest that I reviewed the above mentioned clinical research nurse) notes on research subject  Jacaden Forbush  born [birth year] . I  agree with the findings mentioned above: In addition, I d/w [facility] Dr Duanne Limerick: the PCT is high suggestive of assosciated bacterial infeciton though with clear cxr and normal urine strep test and wbc odds of bacterial pna is low. Still due to suspicion of bacterial infection and with pna being most common in flu patients and  patient being homeless thus a barrier for outpatient followup: will not consider him for study  Recent Labs  Lab 09/10/17 1121  PROCALCITON 8.74      Dr. Brand Males, M.D., F.C.C.P., ACRP-CPI Pulmonary and Critical Care Medicine Principal Investigator & Staff Physician [facility] Pager: [phone], If no answer or between  15:00h - 7:00h: call [phone]  09/11/2017 9:48 AM

## 2017-09-11 DIAGNOSIS — A419 Sepsis, unspecified organism: Secondary | ICD-10-CM | POA: Diagnosis not present

## 2017-09-11 DIAGNOSIS — A4189 Other specified sepsis: Secondary | ICD-10-CM | POA: Diagnosis not present

## 2017-09-11 DIAGNOSIS — R6510 Systemic inflammatory response syndrome (SIRS) of non-infectious origin without acute organ dysfunction: Secondary | ICD-10-CM | POA: Diagnosis not present

## 2017-09-11 DIAGNOSIS — R69 Illness, unspecified: Secondary | ICD-10-CM | POA: Diagnosis not present

## 2017-09-11 LAB — RENAL FUNCTION PANEL
Albumin: 2.4 g/dL — ABNORMAL LOW (ref 3.5–5.0)
Anion gap: 9 (ref 5–15)
BUN: 7 mg/dL (ref 6–20)
CO2: 23 mmol/L (ref 22–32)
Calcium: 8.3 mg/dL — ABNORMAL LOW (ref 8.9–10.3)
Chloride: 106 mmol/L (ref 101–111)
Creatinine, Ser: 0.99 mg/dL (ref 0.61–1.24)
GFR calc Af Amer: >60 mL/min (ref >60)
GFR calc non Af Amer: >60 mL/min (ref >60)
Glucose, Bld: 223 mg/dL — ABNORMAL HIGH (ref 65–99)
Phosphorus: 2.5 mg/dL (ref 2.5–4.6)
Potassium: 3.5 mmol/L (ref 3.5–5.1)
Sodium: 138 mmol/L (ref 135–145)

## 2017-09-11 LAB — GLUCOSE, CAPILLARY
GLUCOSE-CAPILLARY: 213 mg/dL — AB (ref 65–99)
Glucose-Capillary: 168 mg/dL — ABNORMAL HIGH (ref 65–99)
Glucose-Capillary: 145 mg/dL — ABNORMAL HIGH (ref 65–99)
Glucose-Capillary: 213 mg/dL — ABNORMAL HIGH (ref 65–99)

## 2017-09-11 MED ORDER — POTASSIUM CHLORIDE CRYS ER 20 MEQ PO TBCR
40.0000 meq | EXTENDED_RELEASE_TABLET | Freq: Once | ORAL | Status: AC
  Administered 2017-09-11: 40 meq via ORAL
  Filled 2017-09-11: qty 2

## 2017-09-11 MED ORDER — AMLODIPINE BESYLATE 10 MG PO TABS
10.0000 mg | ORAL_TABLET | Freq: Every day | ORAL | Status: DC
  Administered 2017-09-11 – 2017-09-13 (×3): 10 mg via ORAL
  Filled 2017-09-11 (×3): qty 1

## 2017-09-11 MED ORDER — IPRATROPIUM-ALBUTEROL 0.5-2.5 (3) MG/3ML IN SOLN: 3.0000 mL | Freq: Four times a day (QID) | RESPIRATORY_TRACT | Status: DC | PRN

## 2017-09-11 MED ORDER — ATORVASTATIN CALCIUM 20 MG PO TABS
20.0000 mg | ORAL_TABLET | Freq: Every day | ORAL | Status: DC
  Administered 2017-09-11 – 2017-09-12 (×2): 20 mg via ORAL
  Filled 2017-09-11 (×2): qty 1

## 2017-09-11 NOTE — Progress Notes (Signed)
PROGRESS NOTE    Richard Powers  ZOX:[id number] DOB: Aug 12, 1946 DOA: 09/09/2017 PCP: System, Pcp Not In  Outpatient Specialists:    Brief Narrative: Patient is a 72 y.o. male with past medical history significant for hypertension and diabetes mellitus. Patient is a very poor historian. Patient is homeless, and lives in a shelter. Patient was admitted with SIRS/Sepsis (as per documentation), and influenza A is noted to be positive. Patient was volume deplete on admission, with associated AKI, likely pre renal that has resolved with hydration. Patient has been started on Tamiflu. No other clear source of infection. Pro calcitonin was elevated. Will repeat Procalcitonin and CXR (as patient is now hydrated) in am. Will consult case management to assist with discharge plans and needs in am. Assessment & Plan:   Principal Problem:   Sepsis/SIRS   Influenza A Active Problems:   Volume depletion   AKI, likely pre renal   Essential hypertension   DM II (diabetes mellitus, type II), controlled (HCC)   Hypokalemia   Poor historian   Tamiflu Complete work up (Repeat CXR in am. Repeat Procalcitonin in am) Continue antibiotics IV Hydration Monitor electrolyte and renal function Replete potassium Consult Case Management to assist with discharge needs (Patient is homeless)  DVT prophylaxis: [place] Lovenox Code Status: Full Family Communication:  Disposition Plan: Home eventually  Consultants:   None  Procedures:   None  Antimicrobials:   Tamiflu  Rocephin  Azithromycin    Subjective: Poor historian  Objective: Vitals:   09/10/17 2309 09/11/17 0500 09/11/17 0557 09/11/17 0732  BP: (!) 161/75  (!) 172/78   Pulse: 70  77   Resp: 18  20   Temp: 98.6 F (37 C)  98.6 F (37 C)   TempSrc: Oral  Oral   SpO2: 100%  95% 97%  Weight:  74 kg (163 lb 2.3 oz)    Height:        Intake/Output Summary (Last 24 hours) at 09/11/2017 0933 Last data filed at 09/11/2017 0823 Gross per 24  hour  Intake 1238.75 ml  Output 1650 ml  Net -411.25 ml   Filed Weights   09/09/17 2039 09/10/17 0603 09/11/17 0500  Weight: 79.4 kg (175 lb) 73.4 kg (161 lb 13.1 oz) 74 kg (163 lb 2.3 oz)    Examination:  General exam: Appears calm and comfortable  Respiratory system: Clear to auscultation.   Cardiovascular system: S1 & S2. Gastrointestinal system: Abdomen is nondistended, soft and nontender. No organomegaly or masses felt. Normal bowel sounds heard. Central nervous system: Alert and oriented. No focal neurological deficits. Extremities: Symmetric 5 x 5 power.  Data Reviewed: I have personally reviewed following labs and imaging studies  CBC: Recent Labs  Lab 09/09/17 2000 09/10/17 0216  WBC 8.7 9.1  NEUTROABS 7.7  --   HGB 12.3* 12.3*  HCT 37.5* 36.9*  MCV 88.9 89.3  PLT PLATELET CLUMPS NOTED ON SMEAR, COUNT APPEARS ADEQUATE 226   Basic Metabolic Panel: Recent Labs  Lab 09/09/17 2000 09/10/17 0216 09/11/17 0630  NA 135 137 138  K 3.9 3.4* 3.5  CL 100* 102 106  CO2 24 25 23   GLUCOSE 357* 302* 223*  BUN 14 11 7   CREATININE 1.33* 1.18 0.99  CALCIUM 8.7* 8.1* 8.3*  PHOS  --   --  2.5   GFR: Estimated Creatinine Clearance: 64 mL/min (by C-G formula based on SCr of 0.99 mg/dL). Liver Function Tests: Recent Labs  Lab 09/09/17 2000 09/11/17 0630  AST 26  --  ALT 15*  --   ALKPHOS 50  --   BILITOT 0.9  --   PROT 6.8  --   ALBUMIN 3.1* 2.4*   No results for input(s): LIPASE, AMYLASE in the last 168 hours. No results for input(s): AMMONIA in the last 168 hours. Coagulation Profile: Recent Labs  Lab 09/09/17 2000  INR 1.33   Cardiac Enzymes: No results for input(s): CKTOTAL, CKMB, CKMBINDEX, TROPONINI in the last 168 hours. BNP (last 3 results) No results for input(s): PROBNP in the last 8760 hours. HbA1C: No results for input(s): HGBA1C in the last 72 hours. CBG: Recent Labs  Lab 09/10/17 0041 09/10/17 0742 09/10/17 1226 09/10/17 1639  09/11/17 0749  GLUCAP 385* 116* 154* 151* 213*   Lipid Profile: No results for input(s): CHOL, HDL, LDLCALC, TRIG, CHOLHDL, LDLDIRECT in the last 72 hours. Thyroid Function Tests: No results for input(s): TSH, T4TOTAL, FREET4, T3FREE, THYROIDAB in the last 72 hours. Anemia Panel: No results for input(s): VITAMINB12, FOLATE, FERRITIN, TIBC, IRON, RETICCTPCT in the last 72 hours. Urine analysis:    Component Value Date/Time   COLORURINE YELLOW 09/09/2017 2008   APPEARANCEUR CLEAR 09/09/2017 2008   LABSPEC 1.029 09/09/2017 2008   PHURINE 5.0 09/09/2017 2008   GLUCOSEU >=500 (A) 09/09/2017 2008   HGBUR MODERATE (A) 09/09/2017 2008   BILIRUBINUR NEGATIVE 09/09/2017 2008   KETONESUR 5 (A) 09/09/2017 2008   PROTEINUR 30 (A) 09/09/2017 2008   UROBILINOGEN 0.2 02/22/2015 2331   NITRITE NEGATIVE 09/09/2017 2008   LEUKOCYTESUR NEGATIVE 09/09/2017 2008   Sepsis Labs: @LABRCNTIP (procalcitonin:4,lacticidven:4)  ) Recent Results (from the past 240 hour(s))  Culture, blood (Routine x 2)     Status: None (Preliminary result)   Collection Time: 09/09/17  8:21 PM  Result Value Ref Range Status   Specimen Description BLOOD BLOOD RIGHT HAND  Final   Special Requests AEROBIC BOTTLE ONLY Blood Culture adequate volume  Final   Culture NO GROWTH < 24 HOURS  Final   Report Status PENDING  Incomplete  Culture, blood (Routine x 2)     Status: None (Preliminary result)   Collection Time: 09/09/17  9:15 PM  Result Value Ref Range Status   Specimen Description BLOOD BLOOD RIGHT HAND  Final   Special Requests AEROBIC BOTTLE ONLY Blood Culture adequate volume  Final   Culture NO GROWTH < 24 HOURS  Final   Report Status PENDING  Incomplete         Radiology Studies: Dg Chest 2 View  Result Date: 09/09/2017 CLINICAL DATA:  Altered mental status and hypertension EXAM: CHEST  2 VIEW COMPARISON:  Rib x-ray February 22, 2015 FINDINGS: The aorta is tortuous and probably dilated. The heart size is mildly  enlarged. Both lungs are clear. Old posttraumatic changes of multiple left ribs are identified. IMPRESSION: No active cardiopulmonary disease. Electronically Signed   By: Sherian ReinWei-Chen  Lin M.D.   On: 09/09/2017 20:38   Ct Head Wo Contrast  Result Date: 09/10/2017 CLINICAL DATA:  Acute confusion EXAM: CT HEAD WITHOUT CONTRAST TECHNIQUE: Contiguous axial images were obtained from the base of the skull through the vertex without intravenous contrast. COMPARISON:  09/03/2017 FINDINGS: Brain: No acute territorial infarction, hemorrhage or intracranial mass is visualized. Mild to moderate atrophy. Fairly extensive small vessel ischemic changes of the white matter. 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 Vascular: No hyperdense vessels.  Carotid artery calcification Skull: Normal. Negative for fracture or focal lesion. Sinuses/Orbits: Mucosal thickening in the ethmoid and maxillary sinuses. No acute orbital abnormality.  Other: None IMPRESSION: No CT evidence for acute intracranial abnormality. Atrophy and small vessel ischemic changes of the white matter Electronically Signed   By: Jasmine Pang M.D.   On: 09/10/2017 00:06        Scheduled Meds: . amLODipine  10 mg Oral Daily  . azithromycin  500 mg Oral Q24H  . enoxaparin (LOVENOX) injection  40 mg Subcutaneous Daily  . guaiFENesin  600 mg Oral BID  . hydrochlorothiazide  25 mg Oral Daily  . insulin aspart  0-15 Units Subcutaneous TID WC  . ipratropium-albuterol  3 mL Nebulization BID  . lisinopril  40 mg Oral Daily  . oseltamivir  75 mg Oral BID  . potassium chloride  40 mEq Oral Once  . simvastatin  40 mg Oral QHS  . sodium chloride flush  3 mL Intravenous Q12H   Continuous Infusions: . sodium chloride 1,000 mL (09/11/17 0335)  . cefTRIAXone (ROCEPHIN)  IV Stopped (09/10/17 2130)     LOS: 2 days    Time spent: 23 Minutes    Berton Mount, MD  [facility] Pager #: [phone] 7PM-7AM contact night coverage as  above

## 2017-09-12 ENCOUNTER — Inpatient Hospital Stay (HOSPITAL_COMMUNITY): Payer: Medicare Other

## 2017-09-12 DIAGNOSIS — R69 Illness, unspecified: Secondary | ICD-10-CM

## 2017-09-12 DIAGNOSIS — E119 Type 2 diabetes mellitus without complications: Secondary | ICD-10-CM | POA: Diagnosis not present

## 2017-09-12 DIAGNOSIS — A419 Sepsis, unspecified organism: Secondary | ICD-10-CM | POA: Diagnosis not present

## 2017-09-12 DIAGNOSIS — R6510 Systemic inflammatory response syndrome (SIRS) of non-infectious origin without acute organ dysfunction: Secondary | ICD-10-CM | POA: Diagnosis not present

## 2017-09-12 DIAGNOSIS — A4189 Other specified sepsis: Secondary | ICD-10-CM | POA: Diagnosis not present

## 2017-09-12 LAB — LEGIONELLA PNEUMOPHILA SEROGP 1 UR AG: L. pneumophila Serogp 1 Ur Ag: NEGATIVE

## 2017-09-12 LAB — GLUCOSE, CAPILLARY
GLUCOSE-CAPILLARY: 164 mg/dL — AB (ref 65–99)
GLUCOSE-CAPILLARY: 280 mg/dL — AB (ref 65–99)
Glucose-Capillary: 104 mg/dL — ABNORMAL HIGH (ref 65–99)
Glucose-Capillary: 319 mg/dL — ABNORMAL HIGH (ref 65–99)

## 2017-09-12 LAB — PROCALCITONIN: Procalcitonin: 4.21 ng/mL

## 2017-09-12 MED ORDER — SODIUM CHLORIDE 0.9 % IV SOLN
1000.0000 mL | INTRAVENOUS | Status: AC
  Administered 2017-09-12: 1000 mL via INTRAVENOUS

## 2017-09-12 NOTE — Progress Notes (Signed)
[facility] PROGRESS NOTE  Richard Powers ZOX:[id number] DOB: May 24, 1946 DOA: 09/09/2017 PCP: System, Pcp Not In  Brief summary  71 y.o.malewith past medical history significant for hypertension and diabetes mellitus. Patient is a very poor historian. Patient is homeless, and lives in a shelter. Patient was admitted with SIRS/Sepsis (as per documentation), and influenza A is noted to be positive. Patient was volume deplete on admission, with associated AKI, likely pre renal that has resolved with hydration. Patient has been started on Tamiflu. No other clear source of infection. Pro calcitonin was elevated.   Assessment/Plan:  Influenza A. Cont tamiflu. Improving   Suspected bacterial bronchitis. improving with antibiotics, bronchodilators   Possible mild sepsis/sirs on admission. Resolved. Blood cultures: NGTD.Marland Kitchen As above   Homelessness. Consulted SW   Code Status: full Family Communication: d/w patient, RN (indicate person spoken with, relationship, and if by phone, the number) Disposition Plan: homelessness. Consulted SW   Consultants:  none  Procedures:  none  Antibiotics: Anti-infectives (From admission, onward)   Start     Dose/Rate Route Frequency Ordered Stop   09/10/17 2000  azithromycin (ZITHROMAX) tablet 500 mg     500 mg Oral Every 24 hours 09/09/17 2314 09/16/17 1959   09/10/17 2000  cefTRIAXone (ROCEPHIN) 1 g in dextrose 5 % 50 mL IVPB  Status:  Discontinued     1 g 100 mL/hr over 30 Minutes Intravenous Every 24 hours 09/09/17 2318 09/09/17 2321   09/10/17 2000  cefTRIAXone (ROCEPHIN) 1 g in dextrose 5 % 50 mL IVPB     1 g 100 mL/hr over 30 Minutes Intravenous Every 24 hours 09/09/17 2321 09/16/17 1959   09/10/17 1330  oseltamivir (TAMIFLU) capsule 75 mg     75 mg Oral 2 times daily 09/10/17 1314 09/14/17 2159   09/10/17 1200  oseltamivir (TAMIFLU) capsule 30 mg  Status:  Discontinued     30 mg Oral 2 times daily 09/10/17 1106 09/10/17 1314   09/09/17 2100  oseltamivir (TAMIFLU) capsule 75 mg     75 mg Oral  Once 09/09/17 2055 09/09/17 2259   09/09/17 2030  cefTRIAXone (ROCEPHIN) 1 g in dextrose 5 % 50 mL IVPB  Status:  Discontinued     1 g 100 mL/hr over 30 Minutes Intravenous  Once 09/09/17 2021 09/09/17 2026   09/09/17 2030  azithromycin (ZITHROMAX) 500 mg in dextrose 5 % 250 mL IVPB  Status:  Discontinued     500 mg 250 mL/hr over 60 Minutes Intravenous  Once 09/09/17 2021 09/09/17 2026   09/09/17 2030  azithromycin (ZITHROMAX) 500 mg in dextrose 5 % 250 mL IVPB  Status:  Discontinued     500 mg 250 mL/hr over 60 Minutes Intravenous Every 24 hours 09/09/17 2026 09/09/17 2341   09/09/17 2030  cefTRIAXone (ROCEPHIN) 1 g in dextrose 5 % 50 mL IVPB  Status:  Discontinued     1 g 100 mL/hr over 30 Minutes Intravenous Every 24 hours 09/09/17 2026 09/09/17 2321        (indicate start date, and stop date if known)  HPI/Subjective: Alert. Reports feeling better   Objective: Vitals:   09/11/17 2239 09/12/17 0644  BP: (!) 157/88 (!) 155/78  Pulse: 86 74  Resp: 18 18  Temp: 99.3 F (37.4 C) 97.7 F (36.5 C)  SpO2: 99% 93%    Intake/Output Summary (Last 24 hours) at 09/12/2017 0938 Last data filed at 09/12/2017 0455 Gross per 24 hour  Intake -  Output 3150 ml  Net -  3150 ml   Filed Weights   09/09/17 2039 09/10/17 0603 09/11/17 0500  Weight: 79.4 kg (175 lb) 73.4 kg (161 lb 13.1 oz) 74 kg (163 lb 2.3 oz)    Exam:   General:  No distress   Cardiovascular: s1,s2 rrr  Respiratory: few wheezing   Abdomen: soft, nt, nd   Musculoskeletal: no leg edema    Data Reviewed: Basic Metabolic Panel: Recent Labs  Lab 09/09/17 2000 09/10/17 0216 09/11/17 0630  NA 135 137 138  K 3.9 3.4* 3.5  CL 100* 102 106  CO2 24 25 23   GLUCOSE 357* 302* 223*  BUN 14 11 7   CREATININE 1.33* 1.18 0.99  CALCIUM 8.7* 8.1* 8.3*  PHOS  --   --  2.5   Liver Function Tests: Recent Labs  Lab 09/09/17 2000 09/11/17 0630  AST  26  --   ALT 15*  --   ALKPHOS 50  --   BILITOT 0.9  --   PROT 6.8  --   ALBUMIN 3.1* 2.4*   No results for input(s): LIPASE, AMYLASE in the last 168 hours. No results for input(s): AMMONIA in the last 168 hours. CBC: Recent Labs  Lab 09/09/17 2000 09/10/17 0216  WBC 8.7 9.1  NEUTROABS 7.7  --   HGB 12.3* 12.3*  HCT 37.5* 36.9*  MCV 88.9 89.3  PLT PLATELET CLUMPS NOTED ON SMEAR, COUNT APPEARS ADEQUATE 226   Cardiac Enzymes: No results for input(s): CKTOTAL, CKMB, CKMBINDEX, TROPONINI in the last 168 hours. BNP (last 3 results) No results for input(s): BNP in the last 8760 hours.  ProBNP (last 3 results) No results for input(s): PROBNP in the last 8760 hours.  CBG: Recent Labs  Lab 09/11/17 0749 09/11/17 1210 09/11/17 1732 09/11/17 2237 09/12/17 0745  GLUCAP 213* 168* 145* 213* 164*    Recent Results (from the past 240 hour(s))  Culture, blood (Routine x 2)     Status: None (Preliminary result)   Collection Time: 09/09/17  8:21 PM  Result Value Ref Range Status   Specimen Description BLOOD BLOOD RIGHT HAND  Final   Special Requests AEROBIC BOTTLE ONLY Blood Culture adequate volume  Final   Culture NO GROWTH 2 DAYS  Final   Report Status PENDING  Incomplete  Culture, blood (Routine x 2)     Status: None (Preliminary result)   Collection Time: 09/09/17  9:15 PM  Result Value Ref Range Status   Specimen Description BLOOD BLOOD RIGHT HAND  Final   Special Requests AEROBIC BOTTLE ONLY Blood Culture adequate volume  Final   Culture NO GROWTH 2 DAYS  Final   Report Status PENDING  Incomplete     Studies: Dg Chest 2 View  Result Date: 09/12/2017 CLINICAL DATA:  Cough and weakness. Systemic inflammatory response syndrome EXAM: CHEST  2 VIEW COMPARISON:  September 09, 2017 FINDINGS: There is no edema or consolidation. The heart size and pulmonary vascularity are normal. No adenopathy. There old healed rib fractures on the left. There is aortic atherosclerosis.  IMPRESSION: Aortic atherosclerosis.  No edema or consolidation. Aortic Atherosclerosis (ICD10-I70.0). Electronically Signed   By: Bretta BangWilliam  Woodruff III M.D.   On: 09/12/2017 08:37    Scheduled Meds: . amLODipine  10 mg Oral Daily  . atorvastatin  20 mg Oral QHS  . azithromycin  500 mg Oral Q24H  . enoxaparin (LOVENOX) injection  40 mg Subcutaneous Daily  . guaiFENesin  600 mg Oral BID  . hydrochlorothiazide  25 mg Oral Daily  .  insulin aspart  0-15 Units Subcutaneous TID WC  . lisinopril  40 mg Oral Daily  . oseltamivir  75 mg Oral BID  . sodium chloride flush  3 mL Intravenous Q12H   Continuous Infusions: . sodium chloride 1,000 mL (09/11/17 2109)  . cefTRIAXone (ROCEPHIN)  IV Stopped (09/11/17 2201)    Principal Problem:   Sepsis (HCC) Active Problems:   Essential hypertension   DM II (diabetes mellitus, type II), controlled (HCC)    Time spent: >35 minutes     Esperanza Sheets  [facility] Pager [phone]. If 7PM-7AM, please contact night-coverage at [email or web address], password [facility] 09/12/2017, 9:38 AM  LOS: 3 days

## 2017-09-12 NOTE — Progress Notes (Signed)
Pt is a poor historian- unable to finish admission history or ask questions about pt care.

## 2017-09-13 DIAGNOSIS — A419 Sepsis, unspecified organism: Secondary | ICD-10-CM | POA: Diagnosis not present

## 2017-09-13 DIAGNOSIS — A4189 Other specified sepsis: Secondary | ICD-10-CM | POA: Diagnosis not present

## 2017-09-13 DIAGNOSIS — E119 Type 2 diabetes mellitus without complications: Secondary | ICD-10-CM

## 2017-09-13 DIAGNOSIS — R6510 Systemic inflammatory response syndrome (SIRS) of non-infectious origin without acute organ dysfunction: Secondary | ICD-10-CM | POA: Diagnosis not present

## 2017-09-13 LAB — GLUCOSE, CAPILLARY
GLUCOSE-CAPILLARY: 194 mg/dL — AB (ref 65–99)
Glucose-Capillary: 214 mg/dL — ABNORMAL HIGH (ref 65–99)

## 2017-09-13 MED ORDER — SAXAGLIPTIN HCL 2.5 MG PO TABS: 2.5000 mg | ORAL_TABLET | Freq: Every day | ORAL | 0 refills | Status: DC

## 2017-09-13 MED ORDER — OSELTAMIVIR PHOSPHATE 75 MG PO CAPS: 75.0000 mg | ORAL_CAPSULE | Freq: Two times a day (BID) | ORAL | 0 refills | Status: DC

## 2017-09-13 MED ORDER — DOXYCYCLINE HYCLATE 100 MG PO TABS: 100.0000 mg | ORAL_TABLET | Freq: Two times a day (BID) | ORAL | 0 refills | Status: DC

## 2017-09-13 MED ORDER — DOXYCYCLINE HYCLATE 100 MG PO TABS
100.0000 mg | ORAL_TABLET | Freq: Two times a day (BID) | ORAL | Status: DC
  Filled 2017-09-13 (×2): qty 2

## 2017-09-13 MED ORDER — ACETAMINOPHEN 500 MG PO TABS: 500.0000 mg | ORAL_TABLET | Freq: Four times a day (QID) | ORAL | 0 refills | Status: DC | PRN

## 2017-09-13 MED ORDER — HYDROCHLOROTHIAZIDE 25 MG PO TABS: 25.0000 mg | ORAL_TABLET | Freq: Every day | ORAL | 0 refills | Status: DC

## 2017-09-13 MED ORDER — SIMVASTATIN 40 MG PO TABS: 40.0000 mg | ORAL_TABLET | Freq: Every day | ORAL | 0 refills | Status: DC

## 2017-09-13 MED ORDER — LISINOPRIL 40 MG PO TABS: 40.0000 mg | ORAL_TABLET | Freq: Every day | ORAL | 0 refills | Status: DC

## 2017-09-13 MED ORDER — SITAGLIPTIN PHOSPHATE 25 MG PO TABS: 25.0000 mg | ORAL_TABLET | Freq: Every day | ORAL | 0 refills | Status: DC

## 2017-09-13 NOTE — Progress Notes (Signed)
Per RN, patient's sister has not arrived yet and patient unable to get her on the phone. [facility].  Richard Powers LCSWA [phone]

## 2017-09-13 NOTE — Progress Notes (Signed)
Discharge teaching complete. Meds, diet, follow up appointments and activity reviewed and all questions answered. Copy of instructions and all prescriptions given to patient. Tamiful and Doxycycline doses given to patient to take tonight and tomorrow morning.

## 2017-09-13 NOTE — Progress Notes (Addendum)
11:27am-CSW updated by Shann MedalVictoria Hussey, [facility] RN at [facility]. She reported that [facility]. She will see patient tomorrow morning around 10am and assist him with filling his prescriptions as long as there are paper scripts. MD aware.   10:15am-CSW spoke with patient regarding homelessness. He reported that he has been staying at the shelter [address] and will return there. There is a nurse that has been helping him. His car is also there. He called his sister to ask if she can pick him up. She reported that she can't come now but could come later. CSW provided patient with emergency shelter resources and directed RN to call for a taxi voucher if patient's sister does not arrive in a timely fashion.  CSW signing off.  Osborne Cascoadia Kailey Esquilin LCSWA [phone]

## 2017-09-13 NOTE — Progress Notes (Signed)
Patient discharged via wheelchair with NT in cab to homeless shelter.

## 2017-09-13 NOTE — Progress Notes (Signed)
Cab voucher received from Child psychotherapistsocial worker. Offered to patient but he now says his sister is coming to pick him up. I informed patient that if she is not here by 5pm then we will have to send him to the shelter in a cab.

## 2017-09-13 NOTE — Discharge Summary (Addendum)
Physician Discharge Summary  Richard Powers ZOX:[id number] DOB: [birth year] DOA: 09/09/2017  PCP: System, Pcp Not In  Admit date: 09/09/2017 Discharge date: 09/13/2017  Time spent: >35 minutes  Recommendations for Outpatient Follow-up:  F/u with PCP in 3-7 days. Repeat renal function    Discharge Diagnoses:  Principal Problem:   Sepsis (HCC) Active Problems:   Essential hypertension   DM II (diabetes mellitus, type II), controlled [facility])   Discharge Condition: stable   Diet recommendation: low sodium, carb modified   Filed Weights   09/10/17 0603 09/11/17 0500 09/13/17 0500  Weight: 73.4 kg (161 lb 13.1 oz) 74 kg (163 lb 2.3 oz) 72.2 kg (159 lb 2.8 oz)    History of present illness:   72 y.o.malewith past medical history significant for hypertension and diabetes mellitus. Patient is a very poor historian.Patient is homeless, and lives in a shelter.Patient was admitted with SIRS/Sepsis (as per documentation), and influenzaAis noted to be positive. Patient was volume deplete on admission, with associated AKI, likely pre renal that has resolved with hydration. Patient has been started on Tamiflu. No other clear source of infection. Pro calcitonin was elevated.    Hospital Course:   Influenza A. Cont tamiflu. Improved. To complete the treatment course with tamilu    Suspected bacterial bronchitis. improved with antibiotics, bronchodilators. No wheezing   Possible mild sepsis/sirs on admission. Resolved. Blood cultures: NGTD.Marland Kitchen As above   Mild AKI, prerenal due to sirs. Resolved with IVF. Recommend to f/u with repeat labs in 3-7 days while on lisinopril. D/c ibuprofen   DM. Cont sitagliptin. Hold metformin due to mild lactic acidosis on admission with AKI. Recommended to cont outpatient follow up at discharge to repeat labs in 3-7 days, possible resume if renal labs remains stable   Homelessness. Consulted SW    Procedures:  none (i.e. Studies not automatically  included, echos, thoracentesis, etc; not x-rays)  Consultations:  none  Discharge Exam: Vitals:   09/12/17 2130 09/13/17 0515  BP: (!) 156/91 (!) 162/88  Pulse: (!) 54 79  Resp: 17 17  Temp: 99.8 F (37.7 C) 99.9 F (37.7 C)  SpO2: 95% 95%    General: alert. No distress  Cardiovascular: s1,s2 rrr Respiratory: CTA BL  Discharge Instructions  Discharge Instructions    Diet - low sodium heart healthy   Complete by:  As directed    Increase activity slowly   Complete by:  As directed      Allergies as of 09/13/2017   No Known Allergies     Medication List    STOP taking these medications   ibuprofen 200 MG tablet Commonly known as:  ADVIL,MOTRIN   metFORMIN 500 MG tablet Commonly known as:  GLUCOPHAGE   nabumetone 750 MG tablet Commonly known as:  RELAFEN     TAKE these medications   acetaminophen 500 MG tablet Commonly known as:  TYLENOL Take 1 tablet (500 mg total) by mouth every 6 (six) hours as needed.   doxycycline 100 MG tablet Commonly known as:  VIBRA-TABS Take 1 tablet (100 mg total) by mouth 2 (two) times daily.   hydrochlorothiazide 25 MG tablet Commonly known as:  HYDRODIURIL Take 1 tablet (25 mg total) by mouth daily.   lisinopril 40 MG tablet Commonly known as:  PRINIVIL,ZESTRIL Take 1 tablet (40 mg total) by mouth daily.   oseltamivir 75 MG capsule Commonly known as:  TAMIFLU Take 1 capsule (75 mg total) by mouth 2 (two) times daily.   simvastatin 40 MG tablet  Commonly known as:  ZOCOR Take 1 tablet (40 mg total) by mouth daily.   sitaGLIPtin 25 MG tablet Commonly known as:  JANUVIA Take 1 tablet (25 mg total) by mouth daily.      No Known Allergies    The results of significant diagnostics from this hospitalization (including imaging, microbiology, ancillary and laboratory) are listed below for reference.    Significant Diagnostic Studies: Dg Chest 2 View  Result Date: 09/12/2017 CLINICAL DATA:  Cough and weakness.  Systemic inflammatory response syndrome EXAM: CHEST  2 VIEW COMPARISON:  September 09, 2017 FINDINGS: There is no edema or consolidation. The heart size and pulmonary vascularity are normal. No adenopathy. There old healed rib fractures on the left. There is aortic atherosclerosis. IMPRESSION: Aortic atherosclerosis.  No edema or consolidation. Aortic Atherosclerosis (ICD10-I70.0). Electronically Signed   By: Bretta Bang III M.D.   On: 09/12/2017 08:37   Dg Chest 2 View  Result Date: 09/09/2017 CLINICAL DATA:  Altered mental status and hypertension EXAM: CHEST  2 VIEW COMPARISON:  Rib x-ray February 22, 2015 FINDINGS: The aorta is tortuous and probably dilated. The heart size is mildly enlarged. Both lungs are clear. Old posttraumatic changes of multiple left ribs are identified. IMPRESSION: No active cardiopulmonary disease. Electronically Signed   By: Sherian Rein M.D.   On: 09/09/2017 20:38   Dg Pelvis 1-2 Views  Result Date: 08/15/2017 CLINICAL DATA:  Fall this morning with bilateral hip pain and right knee pain. EXAM: PELVIS - 1-2 VIEW COMPARISON:  None. FINDINGS: The cortical margins of the bony pelvis are intact. No fracture. Pubic symphysis and sacroiliac joints are congruent. Both femoral heads are well-seated in the respective acetabula. Mild degenerative change of both hips with acetabular osteophytes. IMPRESSION: No pelvic fracture. Electronically Signed   By: Rubye Oaks M.D.   On: 08/15/2017 05:52   Ct Head Wo Contrast  Result Date: 09/10/2017 CLINICAL DATA:  Acute confusion EXAM: CT HEAD WITHOUT CONTRAST TECHNIQUE: Contiguous axial images were obtained from the base of the skull through the vertex without intravenous contrast. COMPARISON:  09/03/2017 FINDINGS: Brain: No acute territorial infarction, hemorrhage or intracranial mass is visualized. Mild to moderate atrophy. Fairly extensive small vessel ischemic changes of the white matter. 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  Vascular: No hyperdense vessels.  Carotid artery calcification Skull: Normal. Negative for fracture or focal lesion. Sinuses/Orbits: Mucosal thickening in the ethmoid and maxillary sinuses. No acute orbital abnormality. Other: None IMPRESSION: No CT evidence for acute intracranial abnormality. Atrophy and small vessel ischemic changes of the white matter Electronically Signed   By: Jasmine Pang M.D.   On: 09/10/2017 00:06   Ct Head Wo Contrast  Result Date: 09/03/2017 CLINICAL DATA:  Head trauma, ataxia. Review of electronic records states: [facility] being d/c from this facility by cab. Per EMS bystanders state patient fell twice attempting to walk from cab to doors and is unable to stand without falling back." EXAM: CT HEAD WITHOUT CONTRAST TECHNIQUE: Contiguous axial images were obtained from the base of the skull through the vertex without intravenous contrast. COMPARISON:  None. FINDINGS: Brain: Moderate atrophy. Moderate to advanced chronic small vessel ischemia. No intracranial hemorrhage, mass effect, or midline shift. Ventricular prominence likely secondary to atrophy. Cavum septum pellucidum, normal variant. The basilar cisterns are patent. No evidence of territorial infarct or acute ischemia. No extra-axial or intracranial fluid collection. Vascular: Atherosclerosis of skullbase vasculature without hyperdense vessel or abnormal calcification. Skull: No  fracture or focal lesion. Sinuses/Orbits: No acute finding. Other: None. IMPRESSION: 1.  No acute intracranial abnormality.  No skull fracture. 2. Atrophy and moderate to advanced chronic small vessel ischemia. Electronically Signed   By: Rubye OaksMelanie  Ehinger M.D.   On: 09/03/2017 05:49   Dg Knee Complete 4 Views Right  Result Date: 08/15/2017 CLINICAL DATA:  Fall this morning with right knee pain. EXAM: RIGHT KNEE - COMPLETE 4+ VIEW COMPARISON:  None. FINDINGS: No evidence of fracture, dislocation, or joint  effusion. Mild osteoarthritis with tricompartmental peripheral spurring, most prominent in the patellofemoral compartment. Enthesophytes of the quadriceps and patellar tendons. Soft tissues are unremarkable. IMPRESSION: 1. No fracture or subluxation of the right knee. 2. Mild tricompartmental osteoarthritis. Electronically Signed   By: Rubye OaksMelanie  Ehinger M.D.   On: 08/15/2017 05:53    Microbiology: Recent Results (from the past 240 hour(s))  Culture, blood (Routine x 2)     Status: None (Preliminary result)   Collection Time: 09/09/17  8:21 PM  Result Value Ref Range Status   Specimen Description BLOOD BLOOD RIGHT HAND  Final   Special Requests AEROBIC BOTTLE ONLY Blood Culture adequate volume  Final   Culture NO GROWTH 3 DAYS  Final   Report Status PENDING  Incomplete  Culture, blood (Routine x 2)     Status: None (Preliminary result)   Collection Time: 09/09/17  9:15 PM  Result Value Ref Range Status   Specimen Description BLOOD BLOOD RIGHT HAND  Final   Special Requests AEROBIC BOTTLE ONLY Blood Culture adequate volume  Final   Culture NO GROWTH 3 DAYS  Final   Report Status PENDING  Incomplete     Labs: Basic Metabolic Panel: Recent Labs  Lab 09/09/17 2000 09/10/17 0216 09/11/17 0630  NA 135 137 138  K 3.9 3.4* 3.5  CL 100* 102 106  CO2 24 25 23   GLUCOSE 357* 302* 223*  BUN 14 11 7   CREATININE 1.33* 1.18 0.99  CALCIUM 8.7* 8.1* 8.3*  PHOS  --   --  2.5   Liver Function Tests: Recent Labs  Lab 09/09/17 2000 09/11/17 0630  AST 26  --   ALT 15*  --   ALKPHOS 50  --   BILITOT 0.9  --   PROT 6.8  --   ALBUMIN 3.1* 2.4*   No results for input(s): LIPASE, AMYLASE in the last 168 hours. No results for input(s): AMMONIA in the last 168 hours. CBC: Recent Labs  Lab 09/09/17 2000 09/10/17 0216  WBC 8.7 9.1  NEUTROABS 7.7  --   HGB 12.3* 12.3*  HCT 37.5* 36.9*  MCV 88.9 89.3  PLT PLATELET CLUMPS NOTED ON SMEAR, COUNT APPEARS ADEQUATE 226   Cardiac Enzymes: No  results for input(s): CKTOTAL, CKMB, CKMBINDEX, TROPONINI in the last 168 hours. BNP: BNP (last 3 results) No results for input(s): BNP in the last 8760 hours.  ProBNP (last 3 results) No results for input(s): PROBNP in the last 8760 hours.  CBG: Recent Labs  Lab 09/12/17 0745 09/12/17 1157 09/12/17 1623 09/12/17 2129 09/13/17 0756  GLUCAP 164* 280* 104* 319* 194*       Signed:  Kailynn Satterly N  [facility] 09/13/2017, 10:01 AM

## 2017-09-13 NOTE — Care Management Note (Signed)
Case Management Note  Patient Details  Name: Richard Powers MRN: [id number] Date of Birth: Jan 03, 1946  Subjective/Objective:    Admitted with Sepsis. Homeless.             Doreene NestSusan Hampton (Sister)     [phone]       Action/Plan: [facility], [phone]. Per [facility]ist ,[phone] pt should received medication on tomorrow. Medication will [place] shipped to  [address], [place] [phone].  Expected Discharge Date:  09/13/17               Expected Discharge Plan:  Home/Self Care  In-House Referral:  Clinical Social Work  Discharge planning Services  CM Consult  Post Acute Care Choice:    Choice offered to:     DME Arranged:    DME Agency:     HH Arranged:    HH Agency:     Status of Service:  Completed, signed off  If discussed at [employer] Meetings, dates discussed:    Additional Comments:  Epifanio LeschesCole, Fabrizzio Marcella Hudson, RN 09/13/2017, 12:20 PM

## 2017-09-14 ENCOUNTER — Telehealth: Payer: Self-pay

## 2017-09-14 ENCOUNTER — Encounter: Payer: Self-pay | Admitting: Pediatric Intensive Care

## 2017-09-14 LAB — CULTURE, BLOOD (ROUTINE X 2)
CULTURE: NO GROWTH
CULTURE: NO GROWTH
SPECIAL REQUESTS: ADEQUATE
Special Requests: ADEQUATE

## 2017-09-14 NOTE — Telephone Encounter (Signed)
Request received from [name], [place] [facility] for a hospital follow up appointment for the patient. She was informed that an appointment has been scheduled for tomorrow  - 09/15/17 @ 0945.

## 2017-09-14 NOTE — Congregational Nurse Program (Signed)
Congregational Nurse Program Note  Date of Encounter: 09/14/2017  Past Medical History: Past Medical History:  Diagnosis Date  . Diabetes mellitus without complication (HCC)   . Hypertension     Encounter Details: CNP Questionnaire - 09/14/17 1215      Questionnaire   Patient Status  Not Applicable    Race  Black or African American    Location Patient Served At  [facility]    Insurance  Medicare    Uninsured  Not Applicable    Food  Yes, have food insecurities    Housing/Utilities  No permanent housing    Transportation  Yes, need transportation assistance    Interpersonal Safety  Yes, feel physically and emotionally safe where you currently live    Medication  Yes, have medication insecurities    Medical Provider  Yes    Referrals  Primary Care Provider/Clinic    ED Visit Averted  Not Applicable    Life-Saving Intervention Made  Not Applicable      Client follow up from hospital discharge yesterday. Client states he has not eaten today. Client has been sleeping in the dorm. Client was not aware that he was given medication to take upon discharge. Client did have bag from hospital that had clothing. [facility] staff assisted client with changing into pants (client was in paper scrub pants) and CN assisted client with his dose of tamiflu and doxycycline. Client realizes that he was in the hospital and very sick but is confused about specific details.Repeated asked for assistance calling his sister. BBS- CTA with good air movement. Pulse remains irregular. Client to follow [facility] tomorrow. CN explained process and that client would have arranged transportation. Client agrees to follow up. Luther ParodySidney Alben Spittle([facility] social work Tax inspectorintern) will assist client with calling his sister. [facility] desk staff informed that [facility]. [facility] staff will inform client when medication arrives and assist client with taking hospital prescribed medications tonight.

## 2017-09-15 ENCOUNTER — Encounter: Payer: Self-pay | Admitting: Family Medicine

## 2017-09-15 ENCOUNTER — Ambulatory Visit: Payer: Medicare Other | Attending: Family Medicine | Admitting: Family Medicine

## 2017-09-15 VITALS — BP 157/69 | HR 78 | Temp 97.2°F | Wt 152.6 lb

## 2017-09-15 DIAGNOSIS — I10 Essential (primary) hypertension: Secondary | ICD-10-CM | POA: Diagnosis not present

## 2017-09-15 DIAGNOSIS — Z79899 Other long term (current) drug therapy: Secondary | ICD-10-CM | POA: Insufficient documentation

## 2017-09-15 DIAGNOSIS — A419 Sepsis, unspecified organism: Secondary | ICD-10-CM

## 2017-09-15 DIAGNOSIS — E119 Type 2 diabetes mellitus without complications: Secondary | ICD-10-CM

## 2017-09-15 DIAGNOSIS — Z590 Homelessness: Secondary | ICD-10-CM | POA: Diagnosis not present

## 2017-09-15 DIAGNOSIS — F0390 Unspecified dementia without behavioral disturbance: Secondary | ICD-10-CM | POA: Diagnosis not present

## 2017-09-15 DIAGNOSIS — J101 Influenza due to other identified influenza virus with other respiratory manifestations: Secondary | ICD-10-CM

## 2017-09-15 DIAGNOSIS — Z7984 Long term (current) use of oral hypoglycemic drugs: Secondary | ICD-10-CM | POA: Insufficient documentation

## 2017-09-15 DIAGNOSIS — E1169 Type 2 diabetes mellitus with other specified complication: Secondary | ICD-10-CM | POA: Diagnosis not present

## 2017-09-15 DIAGNOSIS — Z9119 Patient's noncompliance with other medical treatment and regimen: Secondary | ICD-10-CM | POA: Diagnosis not present

## 2017-09-15 LAB — POCT GLYCOSYLATED HEMOGLOBIN (HGB A1C): Hemoglobin A1C: 10.9

## 2017-09-15 LAB — GLUCOSE, POCT (MANUAL RESULT ENTRY): POC Glucose: 274 mg/dl — AB (ref 70–99)

## 2017-09-15 MED ORDER — DONEPEZIL HCL 10 MG PO TABS: 10.0000 mg | ORAL_TABLET | Freq: Every day | ORAL | 3 refills | Status: DC

## 2017-09-15 NOTE — Telephone Encounter (Addendum)
Call received from [facility] requesting that the patient's medications be reviewed. She is also questioning his cognitive function. He has been staying at the [facility] since the end of December. He has been getting his medications from the [place].   Met with the patient when he was in the clinic today. He was very confused how he will be getting back to [facility].  [place] arranged for cab transport back.  Call placed to Lisette Abu, RN after meeting with the patient and speaking with Dr Jarold Song.  Dr Jarold Song reviewed all medications and updated the medication list. He was confused about his medications. [place] stated that she will try to arrange for another congregational nurse to check on the patient tomorrow as there are concerns about medication compliance and understanding his medication instructions. He had a current AVS when he left the clinic. Dr Jarold Song was sending a prescription for aricpet to the [facility], [place] confirmed with the [facility] that the medication will be delivered to the patient at [place], [place]. [place] noted that the director of [employer] may be able to assist with facility placement for the patient. [facility] can complete an FL2 if needed but the patient does not have medicaid. [place] to check with the patient's PCP at the [facility] to inquire if there is any assistance available for facility placement.

## 2017-09-15 NOTE — Progress Notes (Signed)
Subjective:  Patient ID: Richard Powers, male    DOB: February 17, 1946  Age: 72 y.o. MRN: [id number]  CC: Diabetes and Hospitalization Follow-up   HPI Richard Powers is a 72 year old male with a history of type 2 diabetes mellitus (A1c 10.9), hypertension, homelessness who presents today for follow-up from hospitalization [facility] from 09/[facility]/17 through 09/13/17 where he was managed for sepsis and influenza.  On presentation he had complained of feeling ill and was noted to have acute kidney injury with lactic acidosis which was treated with IV fluids and metformin was held. Chest x-ray revealed no active cardiopulmonary disease. He received IV Rocephin in the ED and was placed on azithromycin which was subsequently changed to doxycycline. He tested positive for influenza A and was commenced on Tamiflu. Blood cultures, urine cultures were negative. His condition improved with trending of creatinine down to baseline and he was subsequently discharged with plans for repeat blood work on resuming metformin once his lactic acidosis resolved.  Today he informs me he is feeling fine [facility]; states his [facility]. He has all his medications with him but is yet to take any this morning which explains his elevated blood pressure. He informs me he was discharged 3 weeks ago despite my explaining to him over and over again he was just discharged 2 days ago. During this encounter he has asked repeatedly for my name despite my repeating it; also found myself having to make several repetitions with regards to the discussion we have had this morning.  Past Medical History:  Diagnosis Date  . Diabetes mellitus without complication (HCC)   . Hypertension     History reviewed. No pertinent surgical history.  No Known Allergies   Outpatient Medications Prior to Visit  Medication Sig Dispense Refill  . acetaminophen (TYLENOL) 500 MG tablet Take 1 tablet  (500 mg total) by mouth every 6 (six) hours as needed. 30 tablet 0  . doxycycline (VIBRA-TABS) 100 MG tablet Take 1 tablet (100 mg total) by mouth 2 (two) times daily. 2 tablet 0  . hydrochlorothiazide (HYDRODIURIL) 25 MG tablet Take 1 tablet (25 mg total) by mouth daily. 30 tablet 0  . lisinopril (PRINIVIL,ZESTRIL) 40 MG tablet Take 1 tablet (40 mg total) by mouth daily. 30 tablet 0  . oseltamivir (TAMIFLU) 75 MG capsule Take 1 capsule (75 mg total) by mouth 2 (two) times daily. 2 capsule 0  . saxagliptin HCl (ONGLYZA) 2.5 MG TABS tablet Take 1 tablet (2.5 mg total) by mouth daily. 30 tablet 0  . simvastatin (ZOCOR) 40 MG tablet Take 1 tablet (40 mg total) by mouth daily. 30 tablet 0   No facility-administered medications prior to visit.     ROS Review of Systems  Constitutional: Negative for activity change and appetite change.  HENT: Negative for sinus pressure and sore throat.   Eyes: Negative for visual disturbance.  Respiratory: Negative for cough, chest tightness and shortness of breath.   Cardiovascular: Negative for chest pain and leg swelling.  Gastrointestinal: Negative for abdominal distention, abdominal pain, constipation and diarrhea.  Endocrine: Negative.   Genitourinary: Negative for dysuria.  Musculoskeletal: Negative for joint swelling and myalgias.  Skin: Negative for rash.  Allergic/Immunologic: Negative.   Neurological: Negative for weakness, light-headedness and numbness.  Psychiatric/Behavioral: Negative for dysphoric mood and suicidal ideas.    Objective:  BP (!) 157/69   Pulse 78   Temp (!) 97.2 F (36.2 C) (Oral)  Wt 152 lb 9.6 oz (69.2 kg)   SpO2 98%   BMI 23.90 kg/m   BP/Weight 09/15/2017 09/14/2017 09/13/2017  Systolic BP 157 118 162  Diastolic BP 69 68 88  Wt. (Lbs) 152.6 - 159.17  BMI 23.9 - 24.93      Physical Exam  Constitutional: He is oriented to person, place, and time. He appears well-developed and well-nourished.  Cardiovascular:  Normal rate, normal heart sounds and intact distal pulses.  No murmur heard. Pulmonary/Chest: Effort normal and breath sounds normal. He has no wheezes. He has no rales. He exhibits no tenderness.  Abdominal: Soft. Bowel sounds are normal. He exhibits no distension and no mass. There is no tenderness.  Musculoskeletal: Normal range of motion.  Neurological: He is alert and oriented to person, place, and time.  Significant memory impairment  Psychiatric: He has a normal mood and affect.     Lab Results  Component Value Date   HGBA1C 10.9 09/15/2017    Assessment & Plan:   1. Type 2 diabetes mellitus with other specified complication, without long-term current use of insulin (HCC) Uncontrolled with A1c of 10.9 Currently on Onglyza Metformin was discontinued during hospitalization due to lactic acidosis Attempted obtaining repeat lactate but patient declined after failed single attempt Holding off on sulfonylureas due to hypoglycemic effects which the patient is at high risk for due to his noncompliance and memory problems. - POCT glucose (manual entry) - POCT glycosylated hemoglobin (Hb A1C)  2. Essential hypertension Uncontrolled Yet to take dose of antihypertensives and I have emphasized compliance We will be working with the [employer] and case manager to ensure he has a pillbox filled for help with compliance Counseled on blood pressure goal of less than 130/80, low-sodium, DASH diet, medication compliance, 150 minutes of moderate intensity exercise per week. Discussed medication compliance, adverse effects.   3. Influenza A Improved Currently on Tamiflu  4. Sepsis, due to unspecified organism (HCC) Improved - Lactic Acid, Plasma  5. Dementia without behavioral disturbance, unspecified dementia type He does have severe memory impairments and this will be a major barrier with regards to medication compliance, self care. He will benefit from placement in a monitored  environment where location administration can be supervised. I am commencing Aricept - donepezil (ARICEPT) 10 MG tablet; Take 1 tablet (10 mg total) by mouth at bedtime.  Dispense: 30 tablet; Refill: 3   Meds ordered this encounter  Medications  . donepezil (ARICEPT) 10 MG tablet    Sig: Take 1 tablet (10 mg total) by mouth at bedtime.    Dispense:  30 tablet    Refill:  3    Follow-up: Return in about 1 month (around 10/16/2017) for Follow-up of diabetes mellitus and hypertension.   50 minutes of total face to face time spent and greater than 50% of time spent on counseling, coordination of care, with regards to explaining his medical diagnoses, need for compliance with medications, indication of medications, personally reconciling his medication list, ensuring his prescriptions were faxed over received at the [facility] and coordinating with the case manager and [facility] with regards to his care.  Jaclyn Shaggy MD

## 2017-09-15 NOTE — Patient Instructions (Signed)
Diabetes Mellitus and Nutrition When you have diabetes (diabetes mellitus), it is very important to have healthy eating habits because your blood sugar (glucose) levels are greatly affected by what you eat and drink. Eating healthy foods in the appropriate amounts, at about the same times every day, can help you:  Control your blood glucose.  Lower your risk of heart disease.  Improve your blood pressure.  Reach or maintain a healthy weight.  Every person with diabetes is different, and each person has different needs for a meal plan. Your health care provider may recommend that you work with a diet and nutrition specialist (dietitian) to make a meal plan that is best for you. Your meal plan may vary depending on factors such as:  The calories you need.  The medicines you take.  Your weight.  Your blood glucose, blood pressure, and cholesterol levels.  Your activity level.  Other health conditions you have, such as heart or kidney disease.  How do carbohydrates affect me? Carbohydrates affect your blood glucose level more than any other type of food. Eating carbohydrates naturally increases the amount of glucose in your blood. Carbohydrate counting is a method for keeping track of how many carbohydrates you eat. Counting carbohydrates is important to keep your blood glucose at a healthy level, especially if you use insulin or take certain oral diabetes medicines. It is important to know how many carbohydrates you can safely have in each meal. This is different for every person. Your dietitian can help you calculate how many carbohydrates you should have at each meal and for snack. Foods that contain carbohydrates include:  Bread, cereal, rice, pasta, and crackers.  Potatoes and corn.  Peas, beans, and lentils.  Milk and yogurt.  Fruit and juice.  Desserts, such as cakes, cookies, ice cream, and candy.  How does alcohol affect me? Alcohol can cause a sudden decrease in blood  glucose (hypoglycemia), especially if you use insulin or take certain oral diabetes medicines. Hypoglycemia can be a life-threatening condition. Symptoms of hypoglycemia (sleepiness, dizziness, and confusion) are similar to symptoms of having too much alcohol. If your health care provider says that alcohol is safe for you, follow these guidelines:  Limit alcohol intake to no more than 1 drink per day for nonpregnant women and 2 drinks per day for men. One drink equals 12 oz of beer, 5 oz of wine, or 1 oz of hard liquor.  Do not drink on an empty stomach.  Keep yourself hydrated with water, diet soda, or unsweetened iced tea.  Keep in mind that regular soda, juice, and other mixers may contain a lot of sugar and must be counted as carbohydrates.  What are tips for following this plan? Reading food labels  Start by checking the serving size on the label. The amount of calories, carbohydrates, fats, and other nutrients listed on the label are based on one serving of the food. Many foods contain more than one serving per package.  Check the total grams (g) of carbohydrates in one serving. You can calculate the number of servings of carbohydrates in one serving by dividing the total carbohydrates by 15. For example, if a food has 30 g of total carbohydrates, it would be equal to 2 servings of carbohydrates.  Check the number of grams (g) of saturated and trans fats in one serving. Choose foods that have low or no amount of these fats.  Check the number of milligrams (mg) of sodium in one serving. Most people   should limit total sodium intake to less than 2,300 mg per day.  Always check the nutrition information of foods labeled as "low-fat" or "nonfat". These foods may be higher in added sugar or refined carbohydrates and should be avoided.  Talk to your dietitian to identify your daily goals for nutrients listed on the label. Shopping  Avoid buying canned, premade, or processed foods. These  foods tend to be high in fat, sodium, and added sugar.  Shop around the outside edge of the grocery store. This includes fresh fruits and vegetables, bulk grains, fresh meats, and fresh dairy. Cooking  Use low-heat cooking methods, such as baking, instead of high-heat cooking methods like deep frying.  Cook using healthy oils, such as olive, canola, or sunflower oil.  Avoid cooking with butter, cream, or high-fat meats. Meal planning  Eat meals and snacks regularly, preferably at the same times every day. Avoid going long periods of time without eating.  Eat foods high in fiber, such as fresh fruits, vegetables, beans, and whole grains. Talk to your dietitian about how many servings of carbohydrates you can eat at each meal.  Eat 4-6 ounces of lean protein each day, such as lean meat, chicken, fish, eggs, or tofu. 1 ounce is equal to 1 ounce of meat, chicken, or fish, 1 egg, or 1/4 cup of tofu.  Eat some foods each day that contain healthy fats, such as avocado, nuts, seeds, and fish. Lifestyle   Check your blood glucose regularly.  Exercise at least 30 minutes 5 or more days each week, or as told by your health care provider.  Take medicines as told by your health care provider.  Do not use any products that contain nicotine or tobacco, such as cigarettes and e-cigarettes. If you need help quitting, ask your health care provider.  Work with a counselor or diabetes educator to identify strategies to manage stress and any emotional and social challenges. What are some questions to ask my health care provider?  Do I need to meet with a diabetes educator?  Do I need to meet with a dietitian?  What number can I call if I have questions?  When are the best times to check my blood glucose? Where to find more information:  American Diabetes Association: diabetes.org/food-and-fitness/food  Academy of Nutrition and Dietetics:  www.eatright.org/resources/health/diseases-and-conditions/diabetes  National Institute of Diabetes and Digestive and Kidney Diseases (NIH): www.niddk.nih.gov/health-information/diabetes/overview/diet-eating-physical-activity Summary  A healthy meal plan will help you control your blood glucose and maintain a healthy lifestyle.  Working with a diet and nutrition specialist (dietitian) can help you make a meal plan that is best for you.  Keep in mind that carbohydrates and alcohol have immediate effects on your blood glucose levels. It is important to count carbohydrates and to use alcohol carefully. This information is not intended to replace advice given to you by your health care provider. Make sure you discuss any questions you have with your health care provider. Document Released: 05/13/2005 Document Revised: 09/20/2016 Document Reviewed: 09/20/2016 Elsevier Interactive Patient Education  2018 Elsevier Inc.  

## 2017-09-19 ENCOUNTER — Encounter: Payer: Self-pay | Admitting: Pediatric Intensive Care

## 2017-09-19 NOTE — Telephone Encounter (Signed)
[facility] from [facility] [address] [place]. She stated that she needs to check to confirm that the aricept has arrived. She explained that Richard Cavaharlotte Cook, RN saw the patient on 09/16/17 to check on his medications. Benetta SparVictoria is establishing a plan to assist the patient with managing his medications. She has laid out his medications for him and  plans to designate [facility] to remind him to take his medications the days that she is not there to remind him.   She is still waiting for a call back from [facility] Jacqualine [facility]Ghandi[facility] discuss the patient's need for facility placement.  He does not have medicaid, only [employer] medicare.  Will hold off completing an FL2 at this time until plan for placement is determined.

## 2017-09-22 ENCOUNTER — Telehealth: Payer: Self-pay | Admitting: Family Medicine

## 2017-09-22 NOTE — Congregational Nurse Program (Signed)
Congregational Nurse Program Note  Date of Encounter: 09/05/2017  Past Medical History: Past Medical History:  Diagnosis Date  . Diabetes mellitus without complication (HCC)   . Hypertension     Encounter Details: CNP Questionnaire - 09/14/17 1215      Questionnaire   Patient Status  Not Applicable    Race  Black or African American    Location [facility] Served At  [facility]    Insurance  Medicare    Uninsured  Not Applicable    Food  Yes, have food insecurities    Housing/Utilities  No permanent housing    Transportation  Yes, need transportation assistance    Interpersonal Safety  Yes, feel physically and emotionally safe where you currently live    Medication  Yes, have medication insecurities    Medical Provider  Yes    Referrals  Primary Care Provider/Clinic    ED Visit Averted  Not Applicable    Life-Saving Intervention Made  Not Applicable      Clinical Intake - 09/15/17 1011      Pre-visit preparation   Pre-visit preparation completed  Yes      Pain   Pain   No/denies pain      Nutrition Screen   Diabetes  Yes    CBG done?  Yes    CBG resulted in Enter/ Edit results?  Yes    Did pt. bring in CBG monitor from home?  No      Functional Status   Activities of Daily Living  Independent    Ambulation  Independent    Medication Administration  Independent    Home Management  Independent      Abuse/Neglect   Do you feel unsafe in your current relationship?  No    Do you feel physically threatened by others?  No    Anyone hurting you at home, work, or school?  No    Unable to ask?  No      Web designerLanguage Assistant   Interpreter Needed?  No     New client- he is service connected and has a PCP ([facility] Marny LowensteinGhandi[facility]. He has his medication with him- CN reviewed and client takes metformin 1000mg  bid, lisinopril 20mg  qday, HCTZ 25 mg qday. Client seems confused at times and asks CN if she knows where his car is parked. CN will contact [facility] to determine  when client has follow up.

## 2017-09-22 NOTE — Telephone Encounter (Signed)
Spoke with TurkeyVictoria H (congregational nurse) regarding patient. TurkeyVictoria mentioned that at the moment she is waiting on a call back from the [place] to better assist [place]. She also mentioned that at the moment she hasn't been able to have a conversation with patient regarding his sister and his past without patient getting agitated. TurkeyVictoria expressed that patient's condition is a risk to him and shelter. Benetta SparVictoria was not in the office today but will be there tomorrow. If she receives any updates, she will let us know.

## 2017-09-23 ENCOUNTER — Encounter: Payer: Self-pay | Admitting: Pediatric Intensive Care

## 2017-09-23 DIAGNOSIS — E118 Type 2 diabetes mellitus with unspecified complications: Secondary | ICD-10-CM

## 2017-09-23 LAB — GLUCOSE, POCT (MANUAL RESULT ENTRY): POC Glucose: 288 mg/dl — AB (ref 70–99)

## 2017-09-27 ENCOUNTER — Encounter: Payer: Self-pay | Admitting: Pediatric Intensive Care

## 2017-09-27 DIAGNOSIS — E118 Type 2 diabetes mellitus with unspecified complications: Secondary | ICD-10-CM

## 2017-09-27 LAB — GLUCOSE, POCT (MANUAL RESULT ENTRY): POC Glucose: 464 mg/dl — AB (ref 70–99)

## 2017-10-03 ENCOUNTER — Encounter: Payer: Self-pay | Admitting: Pediatric Intensive Care

## 2017-10-03 DIAGNOSIS — E118 Type 2 diabetes mellitus with unspecified complications: Secondary | ICD-10-CM

## 2017-10-03 LAB — GLUCOSE, POCT (MANUAL RESULT ENTRY): POC Glucose: 322 mg/dl — AB (ref 70–99)

## 2017-10-05 ENCOUNTER — Telehealth: Payer: Self-pay

## 2017-10-05 NOTE — Telephone Encounter (Signed)
[facility] to see if patient medication has been filled. I spoke with VA representative and he informed me that medication will be mailed out today or tomorrow.

## 2017-10-05 NOTE — Telephone Encounter (Addendum)
Message received from [facility], RN/[facility] noting that the patient has not received his aricept.  Call [facility] to [facility] [place] # [phone] to inquire about the status of the aricept. Spoke to Kaneasha who stated that they never received the prescription. She provided her fax # to resend -  # [phone].  [facility] [address] [address] is listed as the pharmacy as the patient's pharmacy for aricept. Call placed to [facility] # [phone]. Spoke to [place] who stated that they do not have aricept on the patient's profile.    Harle StanfordAlycia Farrington, CMA refaxed the [facility] #.   Update provided to [name], RN/[facility] [address] [place].

## 2017-10-09 NOTE — Congregational Nurse Program (Signed)
Congregational Nurse Program Note  Date of Encounter: 09/09/2017  Past Medical History: Past Medical History:  Diagnosis Date  . Diabetes mellitus without complication (HCC)   . Hypertension     Encounter Details: CNP Questionnaire - 09/14/17 1215      Questionnaire   Patient Status  Not Applicable    Race  Black or African American    Location [facility] Served At  [facility]    Insurance  Medicare    Uninsured  Not Applicable    Food  Yes, have food insecurities    Housing/Utilities  No permanent housing    Transportation  Yes, need transportation assistance    Interpersonal Safety  Yes, feel physically and emotionally safe where you currently live    Medication  Yes, have medication insecurities    Medical Provider  Yes    Referrals  Primary Care Provider/Clinic    ED Visit Averted  Not Applicable    Life-Saving Intervention Made  Not Applicable      Clinical Intake - 09/15/17 1011      Pre-visit preparation   Pre-visit preparation completed  Yes      Pain   Pain   No/denies pain      Nutrition Screen   Diabetes  Yes    CBG done?  Yes    CBG resulted in Enter/ Edit results?  Yes    Did pt. bring in CBG monitor from home?  No      Functional Status   Activities of Daily Living  Independent    Ambulation  Independent    Medication Administration  Independent    Home Management  Independent      Abuse/Neglect   Do you feel unsafe in your current relationship?  No    Do you feel physically threatened by others?  No    Anyone hurting you at home, work, or school?  No    Unable to ask?  No      Web designerLanguage Assistant   Interpreter Needed?  No     BP/BG check. Client continues agitated and does not know what medication he's supposed to be taking. He knows that he is supposed to be taking eye dropps but doesn't know the names. He becomes agitated with basic health questions. CN will contact [facility] pharmacy to determine what medications client is supposed to be taking.

## 2017-10-10 ENCOUNTER — Telehealth: Payer: Self-pay

## 2017-10-10 NOTE — Telephone Encounter (Signed)
Call received from [facility], RN/[facility] [address] [place]. She reported that the aricept still has not arrived and she has not heard back from the [place] social worker to discuss options for supportive housing for the patient. He needs assistance with medication management as he is not able to manage his medications independently. He did not take his medications this weekend and there was no [facility] at [facility] to assist him with this task.   Call placed to [facility] [place] # [phone] and spoke to [place] who stated that the aricept was mailed on 10/06/17 to the address - [address], [place] [facility]  Call [facility] the [facility] [place] # [phone]. HIPAA compliant voicemail message left for Delila PereyraBertina Duncan, SW requesting a call back to # [phone] to discuss need for assistance with placing the patient in a supportive living environment as he is currently staying in a shelter.   Update provided to V. Deneen HartsHussey, RN/[facility].

## 2017-10-11 ENCOUNTER — Telehealth: Payer: Self-pay | Admitting: Pediatric Intensive Care

## 2017-10-11 ENCOUNTER — Encounter: Payer: Self-pay | Admitting: Pediatric Intensive Care

## 2017-10-11 DIAGNOSIS — E118 Type 2 diabetes mellitus with unspecified complications: Secondary | ICD-10-CM

## 2017-10-11 LAB — GLUCOSE, POCT (MANUAL RESULT ENTRY): POC GLUCOSE: 320 mg/dL — AB (ref 70–99)

## 2017-10-11 NOTE — Telephone Encounter (Signed)
CN received call from Delila PereyraBertina Duncan CSW at [facility]. CN explained complexity and safety issues associated with client's continued residence at shelter. Ms Para MarchDuncan recommends APS contact as client needs to have expedited Medicaid for LTC placement. CN will contact APS to make report.

## 2017-10-11 NOTE — Telephone Encounter (Signed)
[facility] message [facility].

## 2017-10-13 ENCOUNTER — Encounter (HOSPITAL_COMMUNITY): Payer: Self-pay | Admitting: Emergency Medicine

## 2017-10-13 ENCOUNTER — Emergency Department (HOSPITAL_COMMUNITY)
Admission: EM | Admit: 2017-10-13 | Discharge: 2017-10-13 | Disposition: A | Discharge status: Home / Self Care | Payer: Medicare Other | Attending: Emergency Medicine | Admitting: Emergency Medicine

## 2017-10-13 ENCOUNTER — Telehealth: Payer: Self-pay | Admitting: Family Medicine

## 2017-10-13 DIAGNOSIS — Z87891 Personal history of nicotine dependence: Secondary | ICD-10-CM | POA: Diagnosis not present

## 2017-10-13 DIAGNOSIS — L853 Xerosis cutis: Secondary | ICD-10-CM | POA: Diagnosis present

## 2017-10-13 DIAGNOSIS — Z7984 Long term (current) use of oral hypoglycemic drugs: Secondary | ICD-10-CM | POA: Insufficient documentation

## 2017-10-13 DIAGNOSIS — Z79899 Other long term (current) drug therapy: Secondary | ICD-10-CM | POA: Diagnosis not present

## 2017-10-13 DIAGNOSIS — E119 Type 2 diabetes mellitus without complications: Secondary | ICD-10-CM | POA: Insufficient documentation

## 2017-10-13 DIAGNOSIS — I10 Essential (primary) hypertension: Secondary | ICD-10-CM | POA: Insufficient documentation

## 2017-10-13 NOTE — Clinical Social Work Note (Signed)
Clinical Social Work Assessment  Patient Details  Name: Richard Powers MRN: [id number] Date of Birth: 05-23-1946  Date of referral:  10/13/17               Reason for consult:  Facility Placement                Permission sought to share information with:  Case Manager Permission granted to share information::  Yes, Verbal Permission Granted  Name::        Agency::     Relationship::     Contact Information:     Housing/Transportation Living arrangements for the past 2 months:  Homeless Shelter([facility] since end of Dec) Source of Information:  Patient, Other (Comment Required)(Nurse from [employer]) Patient Interpreter Needed:  None Criminal Activity/Legal Involvement Pertinent to Current Situation/Hospitalization:  No - Comment as needed Significant Relationships:  Siblings, Other(Comment)(Nurse from [employer]) Lives with:  Other (Comment)(Homeless Shelter) Do you feel safe going back to the place where you live?  Yes Need for family participation in patient care:  Yes (Comment)  Care giving concerns:  Pt from [facility], homeless shelter. Concerns about care for pt there.    Social Worker assessment / plan:  CSW spoke with pt's nurse at [employer]. CSW met with pt via bedside. Pt had some concerns about going back to the shelter. CSW reiterated that shelter is expecting him and that he will be sent back via taxi.   Employment status:  Retired Nurse, adult PT Recommendations:  Not assessed at this time [place] / Referral to community resources:     Patient/Family's Response to care:  Pt's nurse at [employer] is concerned about pt's level of care at the shelter. Pt's nurse is working on obtaining Medicaid for pt so pt can look for LTC.   Patient/Family's Understanding of and Emotional Response to Diagnosis, Current Treatment, and Prognosis:  Pt understanding of diagnosis, current treatment, and prognosis.   Emotional  Assessment Appearance:  Appears stated age Attitude/Demeanor/Rapport:    Affect (typically observed):  Anxious, Appropriate, Frustrated Orientation:  Oriented to Self, Oriented to Place Alcohol / Substance use:    Psych involvement (Current and /or in the community):  No (Comment)  Discharge Needs  Concerns to be addressed:  Care Coordination, Homelessness Readmission within the last 30 days:    Current discharge risk:  Homeless(Staying at [facility]) Barriers to Discharge:  No Barriers Identified   Wendelyn Breslow, LCSW 10/13/2017, 9:26 PM

## 2017-10-13 NOTE — Discharge Instructions (Signed)
Use a moisturizing cream on your dry skin 2 or 3 times a day.  Call your doctor to find out what medications you need to be on, and take them when they prescribed him.

## 2017-10-13 NOTE — Care Management (Addendum)
ED CM spoke with Shann MedalVictoria Hussey RN at [facility] concerning [facility]. Patient has been recently declining unable to remember to take meds  Patient was evicted from his apartment in December and has been staying at the homeless shelter since then. .[facility] at [facility] have been working with him to assist with placement due to complex medical issues, coupled now with homelessness. Patient is also a diabetic and  has had difficulty managing his medications . ED CM and ED CSW  will continue to follow up with patient

## 2017-10-13 NOTE — Telephone Encounter (Signed)
[facility] [place] #[phone], and [facility]'s [facility]. Charity also confirmed that in order for patient to receive [facility], it will need to come from Dr. Marny LowensteinGhandi. I explained to [facility] that patient got his medication following his discharge and [facility] stated that they (hospital) must have contacted Dr. Marny LowensteinGhandi to get medication filled. The reason for this is because both the [place] and provider needs to keep track of medication patient is taking.   Charity stated that the best way to have medication expedited is for provider to write on the rx that "patient needs medication as soon as possible" or "overnight". Another option is for patient to take prescription to the window.  Charity is not sure if it would be best to fax medication prescription to [facility] or [place] [facility].

## 2017-10-13 NOTE — Progress Notes (Signed)
CSW and CM spoke with Shann MedalVictoria Hussey with Congregational nurse at [facility]. CSW received phone call from Erskine SquibbJane, case Production designer, theatre/television/filmmanager at [facility] to call TurkeyVictoria for an update about this pt.   Nurse from [facility] informed CSW and CM that pt's memory has been declining. Pt has been staying at the [facility] since the end of December when he was evicted. [facility]. Benetta SparVictoria has been working with the pt in seeking LTC. There are concerns that pt continuing to stay at the shelter will not be good for his health. Benetta SparVictoria is working with DSS and the pt to apply for Medicaid and get LTC. Pt needs consistent help with medication management, administrating medication, and struggles to remember things, shelter just does not have the resources for this. Benetta SparVictoria is understanding that pt cannot be placed from the ED into a LTC or SNF.   CSW and CM discussed [facility] with TurkeyVictoria and [facility]. CSW will make APS report for PT.    Plan: If pt is discharged, pt will return to [facility]. Please update TurkeyVictoria at [phone] when pt is discharged.   CSW will continue to follow.   Montine CircleKelsy Jalexa Pifer, Silverio LayLCSWA [facility]  [phone]

## 2017-10-13 NOTE — Progress Notes (Signed)
CSW explained to pt that [facility] and that they are aware that he is coming back tonight.   Provided taxi voucher for pt.   Montine CircleKelsy Merna Baldi, Silverio LayLCSWA [facility]  [phone]

## 2017-10-13 NOTE — Congregational Nurse Program (Signed)
Congregational Nurse Program Note  Date of Encounter: 09/19/2017  Past Medical History: Past Medical History:  Diagnosis Date  . Diabetes mellitus without complication (HCC)   . Hypertension     Encounter Details: CNP Questionnaire - 09/19/17 0930      Questionnaire   Patient Status  Not Applicable    Race  Black or African American    Location [facility] Served At  [facility]    Insurance  Medicare    Uninsured  Not Applicable    Food  Yes, have food insecurities    Housing/Utilities  No permanent housing    Transportation  Yes, need transportation assistance    Interpersonal Safety  Yes, feel physically and emotionally safe where you currently live    Medication  Yes, have medication insecurities    Medical Provider  Yes    Referrals  Primary Care Provider/Clinic    ED Visit Averted  Not Applicable    Life-Saving Intervention Made  Not Applicable      Clinical Intake - 09/15/17 1011      Pre-visit preparation   Pre-visit preparation completed  Yes      Pain   Pain   No/denies pain      Nutrition Screen   Diabetes  Yes    CBG done?  Yes    CBG resulted in Enter/ Edit results?  Yes    Did pt. bring in CBG monitor from home?  No      Functional Status   Activities of Daily Living  Independent    Ambulation  Independent    Medication Administration  Independent    Home Management  Independent      Abuse/Neglect   Do you feel unsafe in your current relationship?  No    Do you feel physically threatened by others?  No    Anyone hurting you at home, work, or school?  No    Unable to ask?  No      Web designerLanguage Assistant   Interpreter Needed?  No     Client states that he is feeling better. States that he doesn't remember if he took his medication this morning. CN will check with front desk staff to check client's pill organizer.

## 2017-10-13 NOTE — Care Management (Signed)
ED evaluation no significant finding, patient is being discharged. ED CM notified Shann MedalVictoria Hussey RN from [facility] of patient's discharge tonight She will continue to follow patient in the community. ED CSW will assist with transport to [facility].

## 2017-10-13 NOTE — ED Triage Notes (Signed)
Pt presents to ED for assessment of cold symptoms x 2 weeks (congestion, dry cough) and began having chills today.  Patient lives at [facility].  States the nurse there has been giving him "two new pills a day", but unsure of what they are or what they are for.  Hx of DM as well.

## 2017-10-13 NOTE — Telephone Encounter (Signed)
Drucie IpVictoria H, congregational nurse, Control and instrumentation engineeremailed Jane (case Production designer, theatre/television/filmmanager) and myself to inform us that she refilled patient's pill keeper with his medication until Wednesday 2/20 with the exception of Aracept. TurkeyVictoria requested that [facility] and be expedited. I will inform provider.  TurkeyVictoria also informed us that she spoke with DSS placement yesterday. DSS would like an FL2 and would like to go ahead and file for Medicaid. TurkeyVictoria can start the Medicaid process with him next week. The case manager said that if he doesnt consent to going to LTC he will need guardianship. Benetta SparVictoria will clear it with Fransisco Beauebbie Garwood, but she is willing to go to the magistrate as she has had the most contact with patient.

## 2017-10-13 NOTE — Telephone Encounter (Signed)
[facility] [place] # [phone] regarding medication refill. Spoke with Cordelia PenSherry and she informed that  in order for patient to use VA pharmacy receiving rx from the  provider that has been assigned to him (Dr. Marny LowensteinGhandi). Informed Cordelia PenSherry that patient is being [facility] by [facility] provider in [place]. Cordelia PenSherry stated that we would have to contact Dr.Ghandi's office and have them refill patient's medication altough he sees [facility] and have them [facility] in [place] to receive it quicker.

## 2017-10-13 NOTE — ED Provider Notes (Signed)
[facility] EMERGENCY DEPARTMENT Provider Note   CSN: [id number] Arrival date & time: 10/13/17  1506     History   Chief Complaint Chief Complaint  Patient presents with  . Chills  . Fatigue    HPI Richard Powers is a 72 y.o. male.  Patient states he is here because his skin is dry on his arms and legs.  He also states that he is having trouble getting his "eye medicines," from the pharmacy.  He also states at the shelter where he lives, the food is not nutritionist and he feels like he is losing weight.  He does not specify any other problems.  He did not complain of chest pain, headache, fever or back pain.  He was hospitalized about 1 month ago with influenza observed and discharged.  He is apparently been living at the shelter since then.  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  There are no other known modifying factors.    HPI  Past Medical History:  Diagnosis Date  . Diabetes mellitus without complication (HCC)   . Hypertension     Patient Active Problem List   Diagnosis Date Noted  . Dementia 09/15/2017  . Sepsis (HCC) 09/09/2017  . Essential hypertension 09/09/2017  . DM II (diabetes mellitus, type II), controlled (HCC) 09/09/2017    History reviewed. No pertinent surgical history.     Home Medications    Prior to Admission medications   Medication Sig Start Date End Date Taking? Authorizing Provider  acetaminophen (TYLENOL) 500 MG tablet Take 1 tablet (500 mg total) by mouth every 6 (six) hours as needed. 09/13/17   Esperanza Sheets, MD  donepezil (ARICEPT) 10 MG tablet Take 1 tablet (10 mg total) by mouth at bedtime. 09/15/17   [name], MD  doxycycline (VIBRA-TABS) 100 MG tablet Take 1 tablet (100 mg total) by mouth 2 (two) times daily. 09/13/17   Esperanza Sheets, MD  hydrochlorothiazide (HYDRODIURIL) 25 MG tablet Take 1 tablet (25 mg total) by mouth daily. 09/13/17    Esperanza Sheets, MD  lisinopril (PRINIVIL,ZESTRIL) 40 MG tablet Take 1 tablet (40 mg total) by mouth daily. 09/13/17   Esperanza Sheets, MD  oseltamivir (TAMIFLU) 75 MG capsule Take 1 capsule (75 mg total) by mouth 2 (two) times daily. 09/13/17   Esperanza Sheets, MD  saxagliptin HCl (ONGLYZA) 2.5 MG TABS tablet Take 1 tablet (2.5 mg total) by mouth daily. 09/13/17   Esperanza Sheets, MD  simvastatin (ZOCOR) 40 MG tablet Take 1 tablet (40 mg total) by mouth daily. 09/13/17   Esperanza Sheets, MD    Family History History reviewed. No pertinent family history.  Social History Social History   Tobacco Use  . Smoking status: Former Games developer  . Smokeless tobacco: Never Used  Substance Use Topics  . Alcohol use: Yes  . Drug use: No     Allergies   Patient has no known allergies.   Review of Systems Review of Systems  All other systems reviewed and are negative.    Physical Exam Updated Vital Signs BP (!) 146/93   Pulse 74   Temp 98.2 F (36.8 C) (Oral)   Resp 16   SpO2 96%   Physical Exam  Constitutional: He is oriented to person, place, and time. He appears well-developed.  He appears somewhat under nourished  HENT:  Head: Normocephalic and atraumatic.  Right Ear: External ear  normal.  Left Ear: External ear normal.  Eyes: Conjunctivae and EOM are normal. Pupils are equal, round, and reactive to light.  Neck: Normal range of motion and phonation normal. Neck supple.  Cardiovascular: Normal rate, regular rhythm and normal heart sounds.  Pulmonary/Chest: Effort normal and breath sounds normal. He exhibits no bony tenderness.  Abdominal: Soft. There is no tenderness.  Musculoskeletal: Normal range of motion.  Neurological: He is alert and oriented to person, place, and time. No cranial nerve deficit or sensory deficit. He exhibits normal muscle tone. Coordination normal.  Skin: Skin is warm, dry and intact. No rash noted.  Skin arms, legs, and feet are somewhat dry.   No areas of redness, drainage, petechiae or vesicles.  Psychiatric: He has a normal mood and affect. His behavior is normal. Judgment and thought content normal.  Nursing note and vitals reviewed.    ED Treatments / Results  Labs (all labs ordered are listed, but only abnormal results are displayed) Labs Reviewed - No data to display  EKG  EKG Interpretation None       Radiology No results found.  Procedures Procedures (including critical care time)  Medications Ordered in ED Medications - No data to display   Initial Impression / Assessment and Plan / ED Course  I have reviewed the triage vital signs and the nursing notes.  Pertinent labs & imaging results that were available during my care of the patient were reviewed by me and considered in my medical decision making (see chart for details).  Clinical Course as of Oct 13 2033  Thu Oct 13, 2017  2034 Case manager and social worker here in the ED.  They were informed of the findings of the case and that the patient does not require admission at this time.  [EW]    Clinical Course User Index [EW] Mancel BaleWentz, Zykee Avakian, MD     Patient Vitals for the past 24 hrs:  BP Temp Temp src Pulse Resp SpO2  10/13/17 1930 (!) 146/93 - - 74 16 96 %  10/13/17 1900 (!) 142/77 - - 70 - 100 %  10/13/17 1857 - - - 77 - 98 %  10/13/17 1735 (!) 143/95 - - 81 14 100 %  10/13/17 1509 (!) 141/71 98.2 F (36.8 C) Oral 74 18 100 %    8:35 PM Reevaluation with update and discussion. After initial assessment and treatment, an updated evaluation reveals no change in clinical status. Mancel BaleElliott Khalen Styer     Final Clinical Impressions(s) / ED Diagnoses   Final diagnoses:  Dry skin    Nonspecific complaints, reassuring evaluation.  Doubt serious bacterial infection or metabolic instability.  Nursing Notes Reviewed/ Care Coordinated Applicable Imaging Reviewed Interpretation of Laboratory Data incorporated into ED treatment  The patient appears  reasonably screened and/or stabilized for discharge and I doubt any other medical condition or other [facility] requiring further screening, evaluation, or treatment in the ED at this time prior to discharge.  Plan: Home Medications- continue usual; Home Treatments- rest, fluids; return here if the recommended treatment, does not improve the symptoms; Recommended follow up- PCP prn    ED Discharge Orders    None       Mancel BaleWentz, Inita Uram, MD 10/13/17 2037

## 2017-10-13 NOTE — ED Notes (Signed)
PT states understanding of care given, follow up care. PT ambulated from ED to car with a steady gait.  

## 2017-10-15 NOTE — Congregational Nurse Program (Signed)
Congregational Nurse Program Note  Date of Encounter: 09/27/2017  Past Medical History: Past Medical History:  Diagnosis Date  . Diabetes mellitus without complication (HCC)   . Hypertension     Encounter Details: CNP Questionnaire - 09/27/17 0830      Questionnaire   Patient Status  Not Applicable    Race  Black or African American    Location Patient Served At  [facility]    Insurance  Medicare    Uninsured  Not Applicable    Food  Yes, have food insecurities    Housing/Utilities  No permanent housing    Transportation  Yes, need transportation assistance    Interpersonal Safety  Yes, feel physically and emotionally safe where you currently live    Medication  Yes, have medication insecurities    Medical Provider  Yes    Referrals  Other    ED Visit Averted  Not Applicable    Life-Saving Intervention Made  Not Applicable      BP/BG check. Cleint took medication while in CN clinic.

## 2017-10-15 NOTE — Congregational Nurse Program (Signed)
Congregational Nurse Program Note  Date of Encounter: 09/23/2017  Past Medical History: Past Medical History:  Diagnosis Date  . Diabetes mellitus without complication (HCC)   . Hypertension     Encounter Details: CNP Questionnaire - 09/23/17 1045      Questionnaire   Patient Status  Not Applicable    Race  Black or African American    Location [facility] Served At  [facility]    Insurance  Medicare    Uninsured  Not Applicable    Food  Yes, have food insecurities    Housing/Utilities  No permanent housing    Transportation  Yes, need transportation assistance    Interpersonal Safety  Yes, feel physically and emotionally safe where you currently live    Medication  Yes, have medication insecurities    Medical Provider  Yes    Referrals  Primary Care Provider/Clinic    ED Visit Averted  Not Applicable    Life-Saving Intervention Made  Not Applicable     BP/BG check. CN assisted loading pill box however medications were not in correct day slot. Client states he did not load or change pill box. Client states he doesn't remember taking his medication this morning (pills for "Friday" are gone) and staff did not assist client. CN will leave pill box behind staff counter as client is not able to remember taking his medication.

## 2017-10-17 ENCOUNTER — Other Ambulatory Visit: Payer: Self-pay

## 2017-10-17 ENCOUNTER — Encounter: Payer: Self-pay | Admitting: Family Medicine

## 2017-10-17 ENCOUNTER — Ambulatory Visit: Payer: Medicare Other | Attending: Family Medicine | Admitting: Family Medicine

## 2017-10-17 ENCOUNTER — Telehealth: Payer: Self-pay

## 2017-10-17 ENCOUNTER — Encounter: Payer: Self-pay | Admitting: Pediatric Intensive Care

## 2017-10-17 VITALS — BP 127/78 | HR 79 | Temp 97.4°F | Wt 152.6 lb

## 2017-10-17 DIAGNOSIS — L603 Nail dystrophy: Secondary | ICD-10-CM | POA: Diagnosis not present

## 2017-10-17 DIAGNOSIS — I10 Essential (primary) hypertension: Secondary | ICD-10-CM | POA: Diagnosis not present

## 2017-10-17 DIAGNOSIS — F0390 Unspecified dementia without behavioral disturbance: Secondary | ICD-10-CM | POA: Insufficient documentation

## 2017-10-17 DIAGNOSIS — Z7984 Long term (current) use of oral hypoglycemic drugs: Secondary | ICD-10-CM | POA: Diagnosis not present

## 2017-10-17 DIAGNOSIS — Z79899 Other long term (current) drug therapy: Secondary | ICD-10-CM | POA: Diagnosis not present

## 2017-10-17 DIAGNOSIS — Z590 Homelessness: Secondary | ICD-10-CM | POA: Insufficient documentation

## 2017-10-17 DIAGNOSIS — E118 Type 2 diabetes mellitus with unspecified complications: Secondary | ICD-10-CM | POA: Insufficient documentation

## 2017-10-17 DIAGNOSIS — H409 Unspecified glaucoma: Secondary | ICD-10-CM | POA: Insufficient documentation

## 2017-10-17 DIAGNOSIS — E119 Type 2 diabetes mellitus without complications: Secondary | ICD-10-CM | POA: Diagnosis present

## 2017-10-17 LAB — GLUCOSE, POCT (MANUAL RESULT ENTRY)
POC Glucose: 278 mg/dl — AB (ref 70–99)
POC Glucose: 264 mg/dl — AB (ref 70–99)

## 2017-10-17 NOTE — Congregational Nurse Program (Signed)
Congregational Nurse Program Note  Date of Encounter: 10/17/2017  Past Medical History: Past Medical History:  Diagnosis Date  . Diabetes mellitus without complication (HCC)   . Hypertension     Encounter Details: CNP Questionnaire - 10/17/17 1000      Questionnaire   Patient Status  Not Applicable    Race  Black or African American    Location Patient Served At  [facility]    Insurance  Medicare    Uninsured  Not Applicable    Food  Yes, have food insecurities    Housing/Utilities  No permanent housing    Transportation  Provided transportation assistance (bus pass, taxi voucher, etc.);Yes, need transportation assistance    Interpersonal Safety  Yes, feel physically and emotionally safe where you currently live    Medication  Yes, have medication insecurities    Medical Provider  Yes    Referrals  Primary Care Provider/Clinic    ED Visit Averted  Not Applicable    Life-Saving Intervention Made  Not Applicable      BP/BG check. Reminded client of medical appointment this morning. Client was not aware of appointment. CN reviewed medication organizer. Client has not had any medication from organizer since Wednesday of last week. CN assisted clientt with taking medication for this morning. CN will assist client with taxi for appointment.

## 2017-10-17 NOTE — Progress Notes (Signed)
Subjective:  Patient ID: Richard BiblesWilliam Thomas Schoenfelder Powers, male    DOB: [birth year]  Age: 72 y.o. MRN: [id number]  CC: Diabetes and Hypertension   HPI Richard GuessWilliam Thomas Edelson Powers is a 72 year old male with a history of type 2 diabetes mellitus (A1c 10.9), hypertension, homelessness who presents today for follow-up visit. He was hospitalized at [facility] from 09/09/17 through 09/13/17 where he was managed for sepsis and influenza and completed a course of antibiotics and Tamiflu.  At his last visit he was commenced on Aricept for management of dementia as he does have trouble remembering to take his medications; he has a pillbox which is usually filled by the staff at the [facility]. States [facility]  He complains of dry skin in his feet and long toenails but denies itching of his feet. With regards to his diabetes mellitus he denies numbness in extremities and is compliant with his medications.  He does have glaucoma and is followed closely by ophthalmology. His blood pressure is elevated at his last office visit but is controlled today.  Past Medical History:  Diagnosis Date  . Diabetes mellitus without complication (HCC)   . Hypertension     No past surgical history on file.  No Known Allergies   Outpatient Medications Prior to Visit  Medication Sig Dispense Refill  . acetaminophen (TYLENOL) 500 MG tablet Take 1 tablet (500 mg total) by mouth every 6 (six) hours as needed. 30 tablet 0  . Brinzolamide-Brimonidine (SIMBRINZA) 1-0.2 % SUSP Apply 1 drop to eye 3 (three) times daily.    . Carboxymethylcellulose Sodium 0.25 % SOLN Apply 1 drop to eye 3 (three) times daily.    Marland Kitchen. donepezil (ARICEPT) 10 MG tablet Take 1 tablet (10 mg total) by mouth at bedtime. 30 tablet 3  . hydrochlorothiazide (HYDRODIURIL) 25 MG tablet Take 1 tablet (25 mg total) by mouth daily. 30 tablet 0  . ketotifen (ZADITOR) 0.025 % ophthalmic solution Place 1 drop into both  eyes 2 (two) times daily.    Marland Kitchen. lisinopril (PRINIVIL,ZESTRIL) 40 MG tablet Take 1 tablet (40 mg total) by mouth daily. 30 tablet 0  . saxagliptin HCl (ONGLYZA) 2.5 MG TABS tablet Take 1 tablet (2.5 mg total) by mouth daily. 30 tablet 0  . simvastatin (ZOCOR) 40 MG tablet Take 1 tablet (40 mg total) by mouth daily. 30 tablet 0  . doxycycline (VIBRA-TABS) 100 MG tablet Take 1 tablet (100 mg total) by mouth 2 (two) times daily. 2 tablet 0  . oseltamivir (TAMIFLU) 75 MG capsule Take 1 capsule (75 mg total) by mouth 2 (two) times daily. 2 capsule 0   No facility-administered medications prior to visit.     ROS Review of Systems  Constitutional: Negative for activity change and appetite change.  HENT: Negative for sinus pressure and sore throat.   Eyes: Negative for visual disturbance.  Respiratory: Negative for cough, chest tightness and shortness of breath.   Cardiovascular: Negative for chest pain and leg swelling.  Gastrointestinal: Negative for abdominal distention, abdominal pain, constipation and diarrhea.  Endocrine: Negative.   Genitourinary: Negative for dysuria.  Musculoskeletal: Negative for joint swelling and myalgias.  Skin:       See hpi  Allergic/Immunologic: Negative.   Neurological: Negative for weakness, light-headedness and numbness.  Psychiatric/Behavioral: Negative for dysphoric mood and suicidal ideas.    Objective:  BP 127/78   Pulse 79   Temp (!) 97.4 F (36.3 C) (Oral)  Wt 152 lb 9.6 oz (69.2 kg)   SpO2 98%   BMI 23.90 kg/m   BP/Weight 10/17/2017 10/17/2017 10/13/2017  Systolic BP 132 127 151  Diastolic BP 82 78 96  Wt. (Lbs) - 152.6 -  BMI - 23.9 -      Physical Exam  Constitutional: He is oriented to person, place, and time. He appears well-developed and well-nourished.  Cardiovascular: Normal rate, normal heart sounds and intact distal pulses.  No murmur heard. Pulmonary/Chest: Effort normal and breath sounds normal. He has no wheezes. He has no  rales. He exhibits no tenderness.  Abdominal: Soft. Bowel sounds are normal. He exhibits no distension and no mass. There is no tenderness.  Musculoskeletal: Normal range of motion.  Neurological: He is alert and oriented to person, place, and time.  Skin:  Extremely dry skin on the soles of both feet with scaling, long thick toenails  Psychiatric: He has a normal mood and affect.    CMP Latest Ref Rng & Units 09/11/2017 09/10/2017 09/09/2017  Glucose 65 - 99 mg/dL 161(W) 960(A) 540(J)  BUN 6 - 20 mg/dL 7 11 14   Creatinine 0.61 - 1.24 mg/dL 8.11 9.14 7.82(N)  Sodium 135 - 145 mmol/L 138 137 135  Potassium 3.5 - 5.1 mmol/L 3.5 3.4(L) 3.9  Chloride 101 - 111 mmol/L 106 102 100(L)  CO2 22 - 32 mmol/L 23 25 24   Calcium 8.9 - 10.3 mg/dL 8.3(L) 8.1(L) 8.7(L)  Total Protein 6.5 - 8.1 g/dL - - 6.8  Total Bilirubin 0.3 - 1.2 mg/dL - - 0.9  Alkaline Phos 38 - 126 U/L - - 50  AST 15 - 41 U/L - - 26  ALT 17 - 63 U/L - - 15(L)    Lab Results  Component Value Date   HGBA1C 10.9 09/15/2017       Assessment & Plan:   1. Controlled diabetes mellitus type 2 with complications, unspecified whether long term insulin use (HCC) Uncontrolled with A1c of 10.9 Continue current regimen, diabetic diet Next A1c is due in 12/04/17 at which time I will adjust his regimen accordingly Foot exam performed today - POCT glucose (manual entry)  2. Essential hypertension Controlled Counseled on blood pressure goal of less than 130/80, low-sodium, DASH diet, medication compliance, 150 minutes of moderate intensity exercise per week. Discussed medication compliance, adverse effects.  3. Dementia without behavioral disturbance, unspecified dementia type Currently on Aricept  4. Dystrophic nail - Ambulatory referral to Podiatry  5. Glaucoma of both eyes, unspecified glaucoma type Managed by ophthalmology - Brinzolamide-Brimonidine [facility]) 1-0.2 % SUSP; Apply 1 drop to eye 3 (three) times daily. -  Carboxymethylcellulose Sodium 0.25 % SOLN; Apply 1 drop to eye 3 (three) times daily. - ketotifen (ZADITOR) 0.025 % ophthalmic solution; Place 1 drop into both eyes 2 (two) times daily.   No orders of the defined types were placed in this encounter.   Follow-up: Return in about 3 months (around 01/14/2018) for Follow-up of diabetes mellitus.   [name] MD

## 2017-10-17 NOTE — Patient Instructions (Signed)
Diabetes Mellitus and Nutrition When you have diabetes (diabetes mellitus), it is very important to have healthy eating habits because your blood sugar (glucose) levels are greatly affected by what you eat and drink. Eating healthy foods in the appropriate amounts, at about the same times every day, can help you:  Control your blood glucose.  Lower your risk of heart disease.  Improve your blood pressure.  Reach or maintain a healthy weight.  Every person with diabetes is different, and each person has different needs for a meal plan. Your health care provider may recommend that you work with a diet and nutrition specialist (dietitian) to make a meal plan that is best for you. Your meal plan may vary depending on factors such as:  The calories you need.  The medicines you take.  Your weight.  Your blood glucose, blood pressure, and cholesterol levels.  Your activity level.  Other health conditions you have, such as heart or kidney disease.  How do carbohydrates affect me? Carbohydrates affect your blood glucose level more than any other type of food. Eating carbohydrates naturally increases the amount of glucose in your blood. Carbohydrate counting is a method for keeping track of how many carbohydrates you eat. Counting carbohydrates is important to keep your blood glucose at a healthy level, especially if you use insulin or take certain oral diabetes medicines. It is important to know how many carbohydrates you can safely have in each meal. This is different for every person. Your dietitian can help you calculate how many carbohydrates you should have at each meal and for snack. Foods that contain carbohydrates include:  Bread, cereal, rice, pasta, and crackers.  Potatoes and corn.  Peas, beans, and lentils.  Milk and yogurt.  Fruit and juice.  Desserts, such as cakes, cookies, ice cream, and candy.  How does alcohol affect me? Alcohol can cause a sudden decrease in blood  glucose (hypoglycemia), especially if you use insulin or take certain oral diabetes medicines. Hypoglycemia can be a life-threatening condition. Symptoms of hypoglycemia (sleepiness, dizziness, and confusion) are similar to symptoms of having too much alcohol. If your health care provider says that alcohol is safe for you, follow these guidelines:  Limit alcohol intake to no more than 1 drink per day for nonpregnant women and 2 drinks per day for men. One drink equals 12 oz of beer, 5 oz of wine, or 1 oz of hard liquor.  Do not drink on an empty stomach.  Keep yourself hydrated with water, diet soda, or unsweetened iced tea.  Keep in mind that regular soda, juice, and other mixers may contain a lot of sugar and must be counted as carbohydrates.  What are tips for following this plan? Reading food labels  Start by checking the serving size on the label. The amount of calories, carbohydrates, fats, and other nutrients listed on the label are based on one serving of the food. Many foods contain more than one serving per package.  Check the total grams (g) of carbohydrates in one serving. You can calculate the number of servings of carbohydrates in one serving by dividing the total carbohydrates by 15. For example, if a food has 30 g of total carbohydrates, it would be equal to 2 servings of carbohydrates.  Check the number of grams (g) of saturated and trans fats in one serving. Choose foods that have low or no amount of these fats.  Check the number of milligrams (mg) of sodium in one serving. Most people   should limit total sodium intake to less than 2,300 mg per day.  Always check the nutrition information of foods labeled as "low-fat" or "nonfat". These foods may be higher in added sugar or refined carbohydrates and should be avoided.  Talk to your dietitian to identify your daily goals for nutrients listed on the label. Shopping  Avoid buying canned, premade, or processed foods. These  foods tend to be high in fat, sodium, and added sugar.  Shop around the outside edge of the grocery store. This includes fresh fruits and vegetables, bulk grains, fresh meats, and fresh dairy. Cooking  Use low-heat cooking methods, such as baking, instead of high-heat cooking methods like deep frying.  Cook using healthy oils, such as olive, canola, or sunflower oil.  Avoid cooking with butter, cream, or high-fat meats. Meal planning  Eat meals and snacks regularly, preferably at the same times every day. Avoid going long periods of time without eating.  Eat foods high in fiber, such as fresh fruits, vegetables, beans, and whole grains. Talk to your dietitian about how many servings of carbohydrates you can eat at each meal.  Eat 4-6 ounces of lean protein each day, such as lean meat, chicken, fish, eggs, or tofu. 1 ounce is equal to 1 ounce of meat, chicken, or fish, 1 egg, or 1/4 cup of tofu.  Eat some foods each day that contain healthy fats, such as avocado, nuts, seeds, and fish. Lifestyle   Check your blood glucose regularly.  Exercise at least 30 minutes 5 or more days each week, or as told by your health care provider.  Take medicines as told by your health care provider.  Do not use any products that contain nicotine or tobacco, such as cigarettes and e-cigarettes. If you need help quitting, ask your health care provider.  Work with a counselor or diabetes educator to identify strategies to manage stress and any emotional and social challenges. What are some questions to ask my health care provider?  Do I need to meet with a diabetes educator?  Do I need to meet with a dietitian?  What number can I call if I have questions?  When are the best times to check my blood glucose? Where to find more information:  American Diabetes Association: diabetes.org/food-and-fitness/food  Academy of Nutrition and Dietetics:  www.eatright.org/resources/health/diseases-and-conditions/diabetes  National Institute of Diabetes and Digestive and Kidney Diseases (NIH): www.niddk.nih.gov/health-information/diabetes/overview/diet-eating-physical-activity Summary  A healthy meal plan will help you control your blood glucose and maintain a healthy lifestyle.  Working with a diet and nutrition specialist (dietitian) can help you make a meal plan that is best for you.  Keep in mind that carbohydrates and alcohol have immediate effects on your blood glucose levels. It is important to count carbohydrates and to use alcohol carefully. This information is not intended to replace advice given to you by your health care provider. Make sure you discuss any questions you have with your health care provider. Document Released: 05/13/2005 Document Revised: 09/20/2016 Document Reviewed: 09/20/2016 Elsevier Interactive Patient Education  2018 Elsevier Inc.  

## 2017-10-17 NOTE — Telephone Encounter (Signed)
Call placed to [facility] [place] # [phone] to inquire if the patient has ever had any prescriptions filled there and if they have any insurance information for the patient.  Spoke to [place] who stated that he has not had anything filled there since July 2018 and he had a medicare part D plan at that time and the do not have any insurance information on him at this time.  Call placed to [employer] # [phone].  Spoke to [place] who said that for a patient to have medications filled outside of the [place] system, they need to part of the CHOICE program or fee based program  that they do not have any information that the patient is participating in the CHOICE program or fee based program. He is not pre-approved to go outside of the [place] system.  He said that the patient needs to have his prescriptions ordered by his [place] provider, or he will have to pay out of pocket for the medications. PCP = Dr Daiva Huge # [phone] x [phone]. Today is a holiday and that office is closed. [place] suggested that this CM called Tasha with the fee based program.   Call placed to [facility] with the pharmacy fee based program # [phone] and a voicemail message was left requesting a call back to # [phone].   This CM met with the patient when he was in the clinic today and obtained a copy of his VA ID and temporary CHOICE ID card.  Explained to him that we are trying to determine the best plan for filling his medications. He did note that he can get transportation to the [place] in [place].  He said that he has the # in his phone for the person to call who can drive him but he was not able to find it. Informed him that he can look for it after he gets back to the [facility] and can let Lisette Abu, RN/[facility] know that number.  He also signed a DPR for [facility] to talk to his sister, Lucile Shutters.   Cab transportation for the patient back to [facility] was arranged by  [facility].   Update provided to V. Hussey, RN who noted that an APS referral was placed as there are concerns about the patient being able to care for himself and manage his medications. She stated that as of this morning he has not taken any medication since Thursday, 10/13/17. There is not staff available at [facility] to remind him every time he needs to take his medication.

## 2017-10-18 ENCOUNTER — Encounter: Payer: Self-pay | Admitting: Pediatric Intensive Care

## 2017-10-18 ENCOUNTER — Telehealth: Payer: Self-pay

## 2017-10-18 NOTE — Telephone Encounter (Signed)
[facility] based program # [phone] to inquire about having the patient's medications refilled. A voicemail message was left requesting a call back to # [phone].

## 2017-10-18 NOTE — Telephone Encounter (Signed)
Message received from [place] returning CM call.  Call returned to [facility] # [phone]. Inquired if the [place] would be able to fill the prescriptions for the patient that are ordered by a provider outside of the [place]. Marland Kitchen She stated that his prescriptions needs to be filled through a [place] provider. He is not a part of VA Choice program. She stated that she would inquire with the VA to see if there are any other options for the patient as he is not able to get to the [place] at this time and call this CM back if she has any information to share.    Call received from [facility] [place] explaining that she met with the patient today.  He was more lucid that has been and was very talkative and spoke about the past and how independent he was. She explained that he asked if "anesthesia is legal"  When she asked him to further explain, he said that he did not want to continue to live at the shelter. She stated that he was not suicidal at this time. [place] stated that she asked the shelter SW intern  and Health visitor to develop a safety plan for the patient.  She also noted that APS was interviewing the patient this afternoon. - update provided to Dr Margarita Rana.   Message received from Emerson Monte, RN later this afternoon, noting that APS has accepted the patient's case. APS caseworker - Donella Stade White # [phone].

## 2017-10-18 NOTE — Congregational Nurse Program (Signed)
Congregational Nurse Program Note  Date of Encounter: 10/03/2017  Past Medical History: Past Medical History:  Diagnosis Date  . Diabetes mellitus without complication (HCC)   . Hypertension     Encounter Details: CNP Questionnaire - 10/18/17 1010      Questionnaire   Patient Status  Not Applicable    Race  Black or African American    Location Patient Served At  [facility]    Insurance  Medicare    Uninsured  Not Applicable    Food  Yes, have food insecurities    Housing/Utilities  No permanent housing    Transportation  Yes, need transportation assistance    Interpersonal Safety  No, do not feel physically and emotionally safe where you currently live    Medication  Yes, have medication insecurities    Medical Provider  Yes    Referrals  Other    ED Visit Averted  Not Applicable    Life-Saving Intervention Made  Not Applicable      Clinical Intake - 10/17/17 1128      Pre-visit preparation   Pre-visit preparation completed  Yes      Pain   Pain   No/denies pain      Nutrition Screen   Diabetes  Yes    CBG done?  Yes    CBG resulted in Enter/ Edit results?  Yes    Did pt. bring in CBG monitor from home?  No      Functional Status   Activities of Daily Living  Independent    Ambulation  Independent    Medication Administration  Independent    Home Management  Independent      Abuse/Neglect   Do you feel unsafe in your current relationship?  No    Do you feel physically threatened by others?  No    Anyone hurting you at home, work, or school?  No    Unable to ask?  No      Web designerLanguage Assistant   Interpreter Needed?  No     BP/BG check. Client did not take his medication this morning. Client states that he was unaware that his pill organizer is kept at shelter front desk. CN reminded client to ask for pill organizer when he comes downstairs for breakfast each morning. Client did not know what day of the week this is. CN reminded him that today is Monday.

## 2017-10-18 NOTE — Congregational Nurse Program (Signed)
Congregational Nurse Program Note  Date of Encounter: 10/18/2017  Past Medical History: Past Medical History:  Diagnosis Date  . Diabetes mellitus without complication (HCC)   . Hypertension     Encounter Details: CNP Questionnaire - 10/18/17 1010      Questionnaire   Patient Status  Not Applicable    Race  Black or African American    Location Patient Served At  [facility]    Insurance  Medicare    Food  Yes, have food insecurities    Housing/Utilities  No permanent housing    Transportation  Yes, need transportation assistance    Interpersonal Safety  No, do not feel physically and emotionally safe where you currently live    Medication  Yes, have medication insecurities    Medical Provider  Yes    Referrals  Other    ED Visit Averted  Not Applicable    Life-Saving Intervention Made  Not Applicable      Clinical Intake - 10/17/17 1128      Pre-visit preparation   Pre-visit preparation completed  Yes      Pain   Pain   No/denies pain      Nutrition Screen   Diabetes  Yes    CBG done?  Yes    CBG resulted in Enter/ Edit results?  Yes    Did pt. bring in CBG monitor from home?  No      Functional Status   Activities of Daily Living  Independent    Ambulation  Independent    Medication Administration  Independent    Home Management  Independent      Abuse/Neglect   Do you feel unsafe in your current relationship?  No    Do you feel physically threatened by others?  No    Anyone hurting you at home, work, or school?  No    Unable to ask?  No      Web designerLanguage Assistant   Interpreter Needed?  No     Client was agitated during clinic encounter. Client stated "I want to know if anesthesia is legal?"  CN asked for clarification and client stated that he did not want to continue staying at the shelter. He stated that if he had to continue staying at the shelter he wanted. To have anesthesia to die. Client re-directed to talk about wishes for housing. Corky Mulliana Franco BWS intern  and Tommi EmeryMichael Pearson, shelter director did safety plan with client.

## 2017-10-19 ENCOUNTER — Emergency Department (HOSPITAL_COMMUNITY)
Admission: EM | Admit: 2017-10-19 | Discharge: 2017-10-19 | Disposition: A | Discharge status: Home / Self Care | Payer: Medicare Other | Attending: Emergency Medicine | Admitting: Emergency Medicine

## 2017-10-19 ENCOUNTER — Telehealth: Payer: Self-pay

## 2017-10-19 DIAGNOSIS — L853 Xerosis cutis: Secondary | ICD-10-CM | POA: Diagnosis not present

## 2017-10-19 DIAGNOSIS — I10 Essential (primary) hypertension: Secondary | ICD-10-CM | POA: Insufficient documentation

## 2017-10-19 DIAGNOSIS — Z87891 Personal history of nicotine dependence: Secondary | ICD-10-CM | POA: Insufficient documentation

## 2017-10-19 DIAGNOSIS — R21 Rash and other nonspecific skin eruption: Secondary | ICD-10-CM | POA: Diagnosis present

## 2017-10-19 DIAGNOSIS — F0390 Unspecified dementia without behavioral disturbance: Secondary | ICD-10-CM | POA: Insufficient documentation

## 2017-10-19 DIAGNOSIS — Z79899 Other long term (current) drug therapy: Secondary | ICD-10-CM | POA: Diagnosis not present

## 2017-10-19 DIAGNOSIS — E119 Type 2 diabetes mellitus without complications: Secondary | ICD-10-CM | POA: Diagnosis not present

## 2017-10-19 NOTE — Progress Notes (Signed)
CSW spoke with patient via bedside regarding transportation concerns. Patient requested to speak with sister to see if she could provide ride [facility]/ to her residence. This Clinical research associatewriter spoke with patients sister who stated she is unable to provide transportation to patient at this time.   CSW offered patient bus pass or for patient to privately pay for taxi- patient stated he is unable to get on bus and unable to pay for taxi. CSW questioned if patient had any money on his person, patient stated "I have a wallet but you cant look at it". Patient then stated " just call the police, I would rather live there anyways". CSW requested assistance from security to assist with discharging patient.   Taxi voucher provided to patient for discharge.  CSW witnessed patient walking from bed to - side chair- to wheelchair with no assistance. Taxi vouchers should only be provided after determining if need is appropriate.    Stacy GardnerErin Azavion Bouillon, [facility] Emergency Room Clinical Social Worker [phone]

## 2017-10-19 NOTE — Discharge Instructions (Signed)
Use a moisturizing cream on your feet as needed to help the dry skin.  See a Podiatrist for help with your toenail condition.

## 2017-10-19 NOTE — ED Notes (Signed)
Discharge instructions thoroughly explained to patient. Patient refuses to be discharged at this time despite MD assessment. Pt verbalized to social worker that he did not want to return to [facility] and to call [employer] to take him to jail so he could have a better place to stay. Security and [employer] at the bedside at this time. Pt has agreed to accept cab voucher to return to [facility].

## 2017-10-19 NOTE — ED Provider Notes (Signed)
[facility]-EMERGENCY DEPT Provider Note   CSN: [id number] Arrival date & time: 10/19/17  [id number]     History   Chief Complaint Chief Complaint  Patient presents with  . Psoriasis    HPI Richard Powers is a 72 y.o. male.  He presents for evaluation of dry feet, and discomfort from his toenails.  He states that these conditions prevent him from walking.  He came here by EMS from the shelter where he lives.  This is a recurrent problem for him I saw him for the same problem, 5 days ago.  Since then he has contacted his PCP regarding issues with medications, and been working with a Child psychotherapistsocial worker at the shelter where he currently resides.  Reportedly, they are working to help him change his domicile.  There are no other known modifying factors.   HPI  Past Medical History:  Diagnosis Date  . Diabetes mellitus without complication (HCC)   . Hypertension     Patient Active Problem List   Diagnosis Date Noted  . Glaucoma 10/17/2017  . Dementia 09/15/2017  . Sepsis (HCC) 09/09/2017  . Essential hypertension 09/09/2017  . DM II (diabetes mellitus, type II), controlled (HCC) 09/09/2017    No past surgical history on file.     Home Medications    Prior to Admission medications   Medication Sig Start Date End Date Taking? Authorizing Provider  acetaminophen (TYLENOL) 500 MG tablet Take 1 tablet (500 mg total) by mouth every 6 (six) hours as needed. 09/13/17   Esperanza SheetsBuriev, Ulugbek N, MD  Brinzolamide-Brimonidine (SIMBRINZA) 1-0.2 % SUSP Apply 1 drop to eye 3 (three) times daily.    [provider]  Carboxymethylcellulose Sodium 0.25 % SOLN Apply 1 drop to eye 3 (three) times daily.    [provider]  donepezil (ARICEPT) 10 MG tablet Take 1 tablet (10 mg total) by mouth at bedtime. 09/15/17   [name], [name], MD  hydrochlorothiazide (HYDRODIURIL) 25 MG tablet Take 1 tablet (25 mg total) by mouth daily. 09/13/17   Esperanza SheetsBuriev, Ulugbek N, MD    ketotifen (ZADITOR) 0.025 % ophthalmic solution Place 1 drop into both eyes 2 (two) times daily.    [provider]  lisinopril (PRINIVIL,ZESTRIL) 40 MG tablet Take 1 tablet (40 mg total) by mouth daily. 09/13/17   Esperanza SheetsBuriev, Ulugbek N, MD  saxagliptin HCl (ONGLYZA) 2.5 MG TABS tablet Take 1 tablet (2.5 mg total) by mouth daily. 09/13/17   Esperanza SheetsBuriev, Ulugbek N, MD  simvastatin (ZOCOR) 40 MG tablet Take 1 tablet (40 mg total) by mouth daily. 09/13/17   Esperanza SheetsBuriev, Ulugbek N, MD    Family History No family history on file.  Social History Social History   Tobacco Use  . Smoking status: Former Games developermoker  . Smokeless tobacco: Never Used  Substance Use Topics  . Alcohol use: Yes  . Drug use: No     Allergies   Patient has no known allergies.   Review of Systems Review of Systems  All other systems reviewed and are negative.    Physical Exam Updated Vital Signs BP 137/84 (BP Location: Right Arm)   Pulse 75   Temp 98.3 F (36.8 C)   Resp 18   SpO2 100%   Physical Exam  Constitutional: He appears well-developed and well-nourished.  Elderly  Eyes: EOM are normal. Right eye exhibits no discharge. Left eye exhibits no discharge.  Neck: Normal range of motion.  Cardiovascular: Normal rate.  Pulmonary/Chest: Effort normal.  Musculoskeletal: He  exhibits no deformity.  Feet bilaterally exhibit dry skin without redness, drainage, rash or deformities.  Toenails both feet are exuberant, thickened, and likely have some subungual fungal disease.  There are no areas of bleeding or drainage associated with the nails.  Neurological: He is alert.  No dysarthria or aphasia  Skin: Skin is warm.  Psychiatric: He has a normal mood and affect.     ED Treatments / Results  Labs (all labs ordered are listed, but only abnormal results are displayed) Labs Reviewed - No data to display  EKG  EKG Interpretation None       Radiology No results found.  Procedures Procedures (including  critical care time)  Medications Ordered in ED Medications - No data to display   Initial Impression / Assessment and Plan / ED Course  I have reviewed the triage vital signs and the nursing notes.  Pertinent labs & imaging results that were available during my care of the patient were reviewed by me and considered in my medical decision making (see chart for details).      Patient Vitals for the past 24 hrs:  BP Temp Pulse Resp SpO2  10/19/17 0849 137/84 98.3 F (36.8 C) 75 18 100 %  10/19/17 0840 - - - - 98 %      Final Clinical Impressions(s) / ED Diagnoses   Final diagnoses:  Penile pain  Situational anxiety    Patient with isolated foot discomfort, characterized primarily by dry skin, with nonacute changes of the toenails.  No evidence for local infection feet.  No evidence for significant systemic symptoms.  Nursing Notes Reviewed/ Care Coordinated Applicable Imaging Reviewed Interpretation of Laboratory Data incorporated into ED treatment  The patient appears reasonably screened and/or stabilized for discharge and I doubt any other medical condition or other [facility] requiring further screening, evaluation, or treatment in the ED at this time prior to discharge.  Plan: Home Medications-continue usual, recommend moisturizing cream for feet; Home Treatments-usual bodily cleansing; return here if the recommended treatment, does not improve the symptoms; Recommended follow up-PCP, as needed.  Follow-up with a podiatrist regarding toenail concerns.   ED Discharge Orders    None       Mancel Bale, MD 10/19/17 [phone]

## 2017-10-19 NOTE — ED Triage Notes (Signed)
Per EMS, pt is coming from [facility] with complaints of bilateral foot pain related to psoriasis present for a month. Pt reports having difficulty walking. EMS reports that pt ambulated with assistance with them. Pt is AO x4. Pt has a hx of diabetes.

## 2017-10-19 NOTE — ED Notes (Signed)
Bed: [id number] Expected date:  Expected time:  Means of arrival:  Comments: EMS-skin issue

## 2017-10-19 NOTE — Telephone Encounter (Addendum)
Call placed to [facility], [facility] [place] # [phone].  Informed her that the patient has [facility] and he now needs medication refills. Because the medications were not ordered by a VA provider, he is not able [facility] and would have to pay out of pocket if he had them filled at a local pharmacy. Inquired if Dr Marny LowensteinGhandi would be able to assist as these medications are needed asap. She stated that she would send a message to Dr Marny LowensteinGhandi and her nurse requesting they call this CM back # [phone].  This CM also noted that the patient needs a podiatry referral.  The provider that he saw in this clinic made a referral but the patient may be able to receive the service free if initiated by his VA PCP.  Alger SimonsBertina stated that she would also route that information to Dr Marny LowensteinGhandi and her nurse.   This CM inquired about [place] [place].  Alger SimonsBertina stated that the patient can call [facility]  # [phone] and request transportation.  She stated that she did not know what information was needed to schedule transportation but this number could be called and the message on the machine would provide instructions. She explained that they have volunteers who will drive the patients to appointments and they will go out of [place] from [place] to [place].   She also inquired about the status of an APS referral and this CM confirmed that they accepted his case.   She also provided the fax # for Dr Marny LowensteinGhandi if needed - fax # [phone].   This CM placed to call to [facility] transportation at the number noted above.  The message states that the call to request transportation must be made at least 3 business days in advance.  They need to provide the person's name, last 3 numbers of the social security#, appointment date and time , location of hospital/appointment, pick up address and current phone #.  The message also states that if you should be receiving a confirmation call by 8:00 pm the night before the scheduled pick up.  If you do not receive a call by 8:00 pm, call # [phone].

## 2017-10-20 ENCOUNTER — Other Ambulatory Visit: Payer: Self-pay

## 2017-10-20 ENCOUNTER — Emergency Department (HOSPITAL_COMMUNITY): Payer: Medicare Other

## 2017-10-20 ENCOUNTER — Emergency Department (HOSPITAL_COMMUNITY)
Admission: EM | Admit: 2017-10-20 | Discharge: 2017-10-20 | Disposition: A | Discharge status: Home / Self Care | Payer: Medicare Other | Attending: Emergency Medicine | Admitting: Emergency Medicine

## 2017-10-20 ENCOUNTER — Encounter (HOSPITAL_COMMUNITY): Payer: Self-pay | Admitting: Emergency Medicine

## 2017-10-20 DIAGNOSIS — Z79899 Other long term (current) drug therapy: Secondary | ICD-10-CM | POA: Insufficient documentation

## 2017-10-20 DIAGNOSIS — R05 Cough: Secondary | ICD-10-CM | POA: Insufficient documentation

## 2017-10-20 DIAGNOSIS — E119 Type 2 diabetes mellitus without complications: Secondary | ICD-10-CM | POA: Insufficient documentation

## 2017-10-20 DIAGNOSIS — R5383 Other fatigue: Secondary | ICD-10-CM | POA: Insufficient documentation

## 2017-10-20 DIAGNOSIS — I10 Essential (primary) hypertension: Secondary | ICD-10-CM | POA: Insufficient documentation

## 2017-10-20 DIAGNOSIS — Z87891 Personal history of nicotine dependence: Secondary | ICD-10-CM | POA: Insufficient documentation

## 2017-10-20 LAB — CBC
HCT: 39.7 % (ref 39.0–52.0)
Hemoglobin: 13.6 g/dL (ref 13.0–17.0)
MCH: 30.2 pg (ref 26.0–34.0)
MCHC: 34.3 g/dL (ref 30.0–36.0)
MCV: 88 fL (ref 78.0–100.0)
Platelets: 198 K/uL (ref 150–400)
RBC: 4.51 MIL/uL (ref 4.22–5.81)
RDW: 13 % (ref 11.5–15.5)
WBC: 6.3 K/uL (ref 4.0–10.5)

## 2017-10-20 LAB — URINALYSIS, ROUTINE W REFLEX MICROSCOPIC
BACTERIA UA: NONE SEEN
BILIRUBIN URINE: NEGATIVE
Glucose, UA: >=500 mg/dL — AB
HGB URINE DIPSTICK: NEGATIVE
KETONES UR: 20 mg/dL — AB
LEUKOCYTES UA: NEGATIVE
NITRITE: NEGATIVE
Protein, ur: NEGATIVE mg/dL
SPECIFIC GRAVITY, URINE: 1.022 (ref 1.005–1.030)
pH: 5 (ref 5.0–8.0)

## 2017-10-20 LAB — COMPREHENSIVE METABOLIC PANEL WITH GFR
ALT: 12 U/L — ABNORMAL LOW (ref 17–63)
AST: 19 U/L (ref 15–41)
Albumin: 3.9 g/dL (ref 3.5–5.0)
Alkaline Phosphatase: 44 U/L (ref 38–126)
Anion gap: 15 (ref 5–15)
BUN: 18 mg/dL (ref 6–20)
CO2: 23 mmol/L (ref 22–32)
Calcium: 9.6 mg/dL (ref 8.9–10.3)
Chloride: 100 mmol/L — ABNORMAL LOW (ref 101–111)
Creatinine, Ser: 1.27 mg/dL — ABNORMAL HIGH (ref 0.61–1.24)
GFR calc Af Amer: >60 mL/min
GFR calc non Af Amer: 55 mL/min — ABNORMAL LOW
Glucose, Bld: 296 mg/dL — ABNORMAL HIGH (ref 65–99)
Potassium: 3.4 mmol/L — ABNORMAL LOW (ref 3.5–5.1)
Sodium: 138 mmol/L (ref 135–145)
Total Bilirubin: 1 mg/dL (ref 0.3–1.2)
Total Protein: 7.5 g/dL (ref 6.5–8.1)

## 2017-10-20 LAB — LIPASE, BLOOD: LIPASE: 54 U/L — AB (ref 11–51)

## 2017-10-20 MED ORDER — SODIUM CHLORIDE 0.9 % IV BOLUS (SEPSIS): 500.0000 mL | Freq: Once | INTRAVENOUS | Status: DC

## 2017-10-20 NOTE — ED Provider Notes (Signed)
[facility]-EMERGENCY DEPT Provider Note  CSN: [id number] Arrival date & time: 10/20/17 1143  Chief Complaint(s) Fatigue  HPI Richard Powers is a 72 y.o. male   HPI  CC: fatigue  Onset/Duration: 3 weeks Timing: constant, fluctuating Location: generalized Severity: mild to moderate  Modifying Factors:  Improved by: nothing  Worsened by: nothing Associated Signs/Symptoms:  Pertinent (+): one episode of NBNB emesis this am  Pertinent (-): fevers, chills, URI sx, CP, SOB, Abd pain, diarrhea, dysuria, peripheral edema.  Denies EtOH or drug use. No change in recent meds. Compliant with meds. Poor nutrition per patient, but has at least 2 meals per day; lives at shelter.   Past Medical History Past Medical History:  Diagnosis Date  . Diabetes mellitus without complication (HCC)   . Hypertension    Patient Active Problem List   Diagnosis Date Noted  . Glaucoma 10/17/2017  . Dementia 09/15/2017  . Sepsis (HCC) 09/09/2017  . Essential hypertension 09/09/2017  . DM II (diabetes mellitus, type II), controlled (HCC) 09/09/2017   Home Medication(s) Prior to Admission medications   Medication Sig Start Date End Date Taking? Authorizing Provider  acetaminophen (TYLENOL) 500 MG tablet Take 1 tablet (500 mg total) by mouth every 6 (six) hours as needed. 09/13/17  Yes Buriev, Isaiah Serge, MD  Brinzolamide-Brimonidine (SIMBRINZA) 1-0.2 % SUSP Apply 1 drop to eye 3 (three) times daily.   Yes [provider]  Carboxymethylcellulose Sodium 0.25 % SOLN Apply 1 drop to eye 3 (three) times daily.   Yes [provider]  donepezil (ARICEPT) 10 MG tablet Take 1 tablet (10 mg total) by mouth at bedtime. 09/15/17  Yes [name], MD  hydrochlorothiazide (HYDRODIURIL) 25 MG tablet Take 1 tablet (25 mg total) by mouth daily. 09/13/17  Yes Buriev, Isaiah Serge, MD  ketotifen (ZADITOR) 0.025 % ophthalmic solution Place 1 drop into both eyes 2 (two) times  daily.   Yes [provider]  lisinopril (PRINIVIL,ZESTRIL) 40 MG tablet Take 1 tablet (40 mg total) by mouth daily. 09/13/17  Yes Buriev, Isaiah Serge, MD  saxagliptin HCl (ONGLYZA) 2.5 MG TABS tablet Take 1 tablet (2.5 mg total) by mouth daily. 09/13/17  Yes Buriev, Isaiah Serge, MD  simvastatin (ZOCOR) 40 MG tablet Take 1 tablet (40 mg total) by mouth daily. 09/13/17  Yes Esperanza Sheets, MD                                                                                                                                    Past Surgical History History reviewed. No pertinent surgical history. Family History No family history on file.  Social History Social History   Tobacco Use  . Smoking status: Former Games developer  . Smokeless tobacco: Never Used  Substance Use Topics  . Alcohol use: Yes  . Drug use: No   Allergies Patient has no known allergies.  Review of Systems Review of  Systems All other systems are reviewed and are negative for acute change except as noted in the HPI  Physical Exam Vital Signs  I have reviewed the triage vital signs BP (!) 159/95 (BP Location: Right Arm)   Pulse 78   Temp 98.1 F (36.7 C) (Oral)   Resp 18   SpO2 100%   Physical Exam  Constitutional: He is oriented to person, place, and time. He appears well-developed and well-nourished. No distress.  HENT:  Head: Normocephalic and atraumatic.  Nose: Nose normal.  Eyes: Conjunctivae and EOM are normal. Pupils are equal, round, and reactive to light. Right eye exhibits no discharge. Left eye exhibits no discharge. No scleral icterus.  Neck: Normal range of motion. Neck supple.  Cardiovascular: Normal rate and regular rhythm. Exam reveals no gallop and no friction rub.  No murmur heard. Pulmonary/Chest: Effort normal and breath sounds normal. No stridor. No respiratory distress. He has no rales.  Abdominal: Soft. He exhibits no distension. There is no tenderness.  Musculoskeletal: He exhibits no edema  or tenderness.  Neurological: He is alert and oriented to person, place, and time.  Skin: Skin is warm and dry. No rash noted. He is not diaphoretic. No erythema.  Psychiatric: He has a normal mood and affect.  Vitals reviewed.   ED Results and Treatments Labs (all labs ordered are listed, but only abnormal results are displayed) Labs Reviewed  LIPASE, BLOOD - Abnormal; Notable for the following components:      Result Value   Lipase 54 (*)    All other components within normal limits  COMPREHENSIVE METABOLIC PANEL - Abnormal; Notable for the following components:   Potassium 3.4 (*)    Chloride 100 (*)    Glucose, Bld 296 (*)    Creatinine, Ser 1.27 (*)    ALT 12 (*)    GFR calc non Af Amer 55 (*)    All other components within normal limits  URINALYSIS, ROUTINE W REFLEX MICROSCOPIC - Abnormal; Notable for the following components:   Glucose, UA >=500 (*)    Ketones, ur 20 (*)    Squamous Epithelial / LPF 0-5 (*)    All other components within normal limits  CBC                                                                                                                         EKG  EKG Interpretation  Date/Time:    Ventricular Rate:    PR Interval:    QRS Duration:   QT Interval:    QTC Calculation:   R Axis:     Text Interpretation:        Radiology Dg Chest 2 View  Result Date: 10/20/2017 CLINICAL DATA:  Cough, flu like symptoms. EXAM: CHEST  2 VIEW COMPARISON:  Chest x-ray dated September 12, 2017. FINDINGS: The heart size and mediastinal contours are within normal limits. Normal pulmonary vascularity. No focal consolidation, pleural effusion, or pneumothorax. Bilateral nipple shadows. Old  left-sided rib fractures. No acute osseous abnormality. IMPRESSION: No active cardiopulmonary disease. Electronically Signed   By: Obie DredgeWilliam T Derry M.D.   On: 10/20/2017 12:06   Pertinent labs & imaging results that were available during my care of the patient were reviewed by  me and considered in my medical decision making (see chart for details).  Medications Ordered in ED Medications  sodium chloride 0.9 % bolus 500 mL (not administered)                                                                                                                                    Procedures Procedures  (including critical care time)  Medical Decision Making / ED Course I have reviewed the nursing notes for this encounter and the patient's prior records (if available in EHR or on provided paperwork).    Patient comes in with nonspecific complaints that has been ongoing for 1 month.  Labs grossly reassuring.  Patient is able to tolerate oral hydration and nutrition.  The patient appears reasonably screened and/or stabilized for discharge and I doubt any other medical condition or other [facility] requiring further screening, evaluation, or treatment in the ED at this time prior to discharge.  The patient is safe for discharge with strict return precautions.   Final Clinical Impression(s) / ED Diagnoses Final diagnoses:  Other fatigue    Disposition: Discharge  Condition: Good  I have discussed the results, Dx and Tx plan with the patient who expressed understanding and agree(s) with the plan. Discharge instructions discussed at great length. The patient was given strict return precautions who verbalized understanding of the instructions. No further questions at time of discharge.    ED Discharge Orders    None       Follow Up: [name], [name], MD [address]  [place] [id number] [phone]  Schedule an appointment as soon as possible for a visit  in 3-5 days, If symptoms do not improve or  worsen     This chart was dictated using voice recognition software.  Despite best efforts to proofread,  errors can occur which can change the documentation meaning.   Nira Connardama, Waynetta Metheny Eduardo, MD 10/20/17 2135

## 2017-10-20 NOTE — ED Triage Notes (Signed)
Per EMS pt complaint of ongoing flu like symptoms for 1.5 months; with cough, n/v/d.

## 2017-10-20 NOTE — ED Notes (Signed)
Unsuccessful attempt at peripheral iv.  

## 2017-10-20 NOTE — ED Notes (Signed)
Patient stood up from bed, removed suction canister from wall and used it to urinate in.

## 2017-10-20 NOTE — ED Notes (Signed)
Patient refused to sign for discharge. Stated "I'm not being discharged, I don't have anywhere to go." RN explained to patient that he did not meet admission criteria. Pt stated " I need to see the Dr. Marsa Powers in my mind I do meet admission criteria! I'm and American and I have the right to hospital care." provider notified and came to bedside, explained to the patient that he could not stay in the hospital due to social situations. Patient ambulated to wheel chair, then stood again to put on his coat. Pt escorted to waiting area.

## 2017-10-21 ENCOUNTER — Telehealth: Payer: Self-pay

## 2017-10-21 DIAGNOSIS — F0390 Unspecified dementia without behavioral disturbance: Secondary | ICD-10-CM

## 2017-10-21 MED ORDER — SAXAGLIPTIN HCL 2.5 MG PO TABS: 2.5000 mg | ORAL_TABLET | Freq: Every day | ORAL | 0 refills | Status: DC

## 2017-10-21 MED ORDER — DONEPEZIL HCL 10 MG PO TABS: 10.0000 mg | ORAL_TABLET | Freq: Every day | ORAL | 3 refills | Status: DC

## 2017-10-21 MED ORDER — SIMVASTATIN 40 MG PO TABS: 40.0000 mg | ORAL_TABLET | Freq: Every day | ORAL | 0 refills | Status: DC

## 2017-10-21 MED ORDER — HYDROCHLOROTHIAZIDE 25 MG PO TABS: 25.0000 mg | ORAL_TABLET | Freq: Every day | ORAL | 0 refills | Status: DC

## 2017-10-21 MED ORDER — LISINOPRIL 40 MG PO TABS: 40.0000 mg | ORAL_TABLET | Freq: Every day | ORAL | 0 refills | Status: DC

## 2017-10-21 MED FILL — ONGLYZA 2.5 MG TABLET: 2.5 | 30 days supply | Qty: 30 | Fill #0

## 2017-10-21 MED FILL — DONEPEZIL HCL 10 MG TABLET: 10 | 30 days supply | Qty: 30 | Fill #0

## 2017-10-21 NOTE — Telephone Encounter (Signed)
Refilled

## 2017-10-21 NOTE — Telephone Encounter (Signed)
This CM spoke to [facility]gr[facility] PrideLuke Van Ausdell, RPH/[facility] regarding the need for re-fills for medications. He stated that Dr Alvis LemmingsNewlin will just need to send the [facility] and they will be able to assist for this month  as the patient is not able to get [facility].  The patient needs hydrochlorothiazide, lisinopril, saxagliptin, simvastatin.    Shann MedalVictoria Hussey, RN/[facility] stated that she can pick [facility] the [facility] for the [facility]  on Monday, 10/24/17.  Call placed to Dr Jacqualine CodeGhandi's office  [facility] # [phone] to discuss the patients' need [place] [facility] [place] [facility]. Spoke to [place] who stated that the patient's provider at the health center can fax a copy of his progress note along with the prescriptions to Dr Marny LowensteinGhandi - fax # [phone] and note attn: dr Marny LowensteinGhandi.  It may take at least 2 weeks to process the request and have the medications delivered.   Message routed to Dr Alvis LemmingsNewlin

## 2017-10-22 ENCOUNTER — Emergency Department (HOSPITAL_COMMUNITY)
Admission: EM | Admit: 2017-10-22 | Discharge: 2017-10-22 | Disposition: A | Discharge status: Home / Self Care | Payer: Medicare Other | Attending: Emergency Medicine | Admitting: Emergency Medicine

## 2017-10-22 ENCOUNTER — Encounter (HOSPITAL_COMMUNITY): Payer: Self-pay | Admitting: Emergency Medicine

## 2017-10-22 DIAGNOSIS — I10 Essential (primary) hypertension: Secondary | ICD-10-CM | POA: Diagnosis not present

## 2017-10-22 DIAGNOSIS — E119 Type 2 diabetes mellitus without complications: Secondary | ICD-10-CM | POA: Diagnosis not present

## 2017-10-22 DIAGNOSIS — Z87891 Personal history of nicotine dependence: Secondary | ICD-10-CM | POA: Diagnosis not present

## 2017-10-22 DIAGNOSIS — R5383 Other fatigue: Secondary | ICD-10-CM | POA: Diagnosis not present

## 2017-10-22 DIAGNOSIS — R6883 Chills (without fever): Secondary | ICD-10-CM | POA: Insufficient documentation

## 2017-10-22 DIAGNOSIS — F0390 Unspecified dementia without behavioral disturbance: Secondary | ICD-10-CM | POA: Diagnosis not present

## 2017-10-22 LAB — CBC WITH DIFFERENTIAL/PLATELET
BASOS PCT: 0 %
Basophils Absolute: 0 10*3/uL (ref 0.0–0.1)
EOS ABS: 0.1 10*3/uL (ref 0.0–0.7)
EOS PCT: 2 %
HCT: 39.7 % (ref 39.0–52.0)
HEMOGLOBIN: 13.5 g/dL (ref 13.0–17.0)
Lymphocytes Relative: 23 %
Lymphs Abs: 1.2 10*3/uL (ref 0.7–4.0)
MCH: 29.8 pg (ref 26.0–34.0)
MCHC: 34 g/dL (ref 30.0–36.0)
MCV: 87.6 fL (ref 78.0–100.0)
MONOS PCT: 12 %
Monocytes Absolute: 0.6 10*3/uL (ref 0.1–1.0)
NEUTROS PCT: 63 %
Neutro Abs: 3.3 10*3/uL (ref 1.7–7.7)
PLATELETS: 242 10*3/uL (ref 150–400)
RBC: 4.53 MIL/uL (ref 4.22–5.81)
RDW: 12.8 % (ref 11.5–15.5)
WBC: 5.3 10*3/uL (ref 4.0–10.5)

## 2017-10-22 LAB — URINALYSIS, ROUTINE W REFLEX MICROSCOPIC
BILIRUBIN URINE: NEGATIVE
Glucose, UA: 50 mg/dL — AB
HGB URINE DIPSTICK: NEGATIVE
KETONES UR: 20 mg/dL — AB
Leukocytes, UA: NEGATIVE
NITRITE: NEGATIVE
PH: 5 (ref 5.0–8.0)
Protein, ur: NEGATIVE mg/dL
Specific Gravity, Urine: 1.021 (ref 1.005–1.030)

## 2017-10-22 LAB — BASIC METABOLIC PANEL
Anion gap: 14 (ref 5–15)
BUN: 16 mg/dL (ref 6–20)
CALCIUM: 9.4 mg/dL (ref 8.9–10.3)
CHLORIDE: 101 mmol/L (ref 101–111)
CO2: 24 mmol/L (ref 22–32)
CREATININE: 1.34 mg/dL — AB (ref 0.61–1.24)
GFR calc non Af Amer: 52 mL/min — ABNORMAL LOW (ref >60)
GFR, EST AFRICAN AMERICAN: 60 mL/min — AB (ref >60)
Glucose, Bld: 173 mg/dL — ABNORMAL HIGH (ref 65–99)
Potassium: 3.5 mmol/L (ref 3.5–5.1)
Sodium: 139 mmol/L (ref 135–145)

## 2017-10-22 NOTE — ED Notes (Signed)
Went into d/c pt, pt says "I am not leaving, I am a citizen of [place] and I have constitutional rights." requested security to bedside. [employer] officer to the bedside, pt says that he is not leaving because he does not have a ride. I offered patient a bus pass, pt said that was not going to help him. Pt now ambulatory with steady gait leaving with [employer] at this time.

## 2017-10-22 NOTE — ED Notes (Addendum)
Informed patient a urine sample was needed. Patient acknowledged but did not provide a sample.

## 2017-10-22 NOTE — ED Provider Notes (Signed)
[facility]-EMERGENCY DEPT Provider Note   CSN: [id number] Arrival date & time: 10/22/17  1900     History   Chief Complaint Chief Complaint  Patient presents with  . Cold Exposure  . Abdominal Pain    HPI Richard Powers is a 72 y.o. male.  This is patient's third visit in the last 4 days.  He currently lives at the shelter and has been seen in the last few days for dry skin on his feet and for fatigue.  Today he is complaining of feeling chills all day.  He states he has not been outside but inside in the shelter and has not been out in the rain.  He denies any cough abdominal pain nausea vomiting diarrhea dysuria.  On review of the prior notes the patient was refusing to be discharged the last time.  In triage they mentioned abdominal pain although the patient denies to me. The history is provided by the patient.  Illness  This is a new problem. The current episode started 12 to 24 hours ago. The problem occurs constantly. The problem has not changed since onset.Pertinent negatives include no chest pain, no headaches and no shortness of breath. Nothing aggravates the symptoms. Nothing relieves the symptoms. He has tried nothing for the symptoms. The treatment provided no relief.    Past Medical History:  Diagnosis Date  . Diabetes mellitus without complication (HCC)   . Hypertension     Patient Active Problem List   Diagnosis Date Noted  . Glaucoma 10/17/2017  . Dementia 09/15/2017  . Sepsis (HCC) 09/09/2017  . Essential hypertension 09/09/2017  . DM II (diabetes mellitus, type II), controlled (HCC) 09/09/2017    History reviewed. No pertinent surgical history.     Home Medications    Prior to Admission medications   Medication Sig Start Date End Date Taking? Authorizing Provider  acetaminophen (TYLENOL) 500 MG tablet Take 1 tablet (500 mg total) by mouth every 6 (six) hours as needed. 09/13/17   Esperanza Sheets, MD    Brinzolamide-Brimonidine (SIMBRINZA) 1-0.2 % SUSP Apply 1 drop to eye 3 (three) times daily.    [provider]  Carboxymethylcellulose Sodium 0.25 % SOLN Apply 1 drop to eye 3 (three) times daily.    [provider]  donepezil (ARICEPT) 10 MG tablet Take 1 tablet (10 mg total) by mouth at bedtime. 10/21/17   [name], MD  hydrochlorothiazide (HYDRODIURIL) 25 MG tablet Take 1 tablet (25 mg total) by mouth daily. 10/21/17   [name], MD  ketotifen (ZADITOR) 0.025 % ophthalmic solution Place 1 drop into both eyes 2 (two) times daily.    [provider]  lisinopril (PRINIVIL,ZESTRIL) 40 MG tablet Take 1 tablet (40 mg total) by mouth daily. 10/21/17   [name], MD  saxagliptin HCl (ONGLYZA) 2.5 MG TABS tablet Take 1 tablet (2.5 mg total) by mouth daily. 10/21/17   [name], MD  simvastatin (ZOCOR) 40 MG tablet Take 1 tablet (40 mg total) by mouth daily. 10/21/17   [name], MD    Family History No family history on file.  Social History Social History   Tobacco Use  . Smoking status: Former Games developer  . Smokeless tobacco: Never Used  Substance Use Topics  . Alcohol use: Yes  . Drug use: No     Allergies   Patient has no known allergies.   Review of Systems Review of Systems  Constitutional: Positive for chills and fatigue. Negative for  fever.  HENT: Negative for ear pain and sore throat.   Eyes: Negative for pain and visual disturbance.  Respiratory: Negative for cough and shortness of breath.   Cardiovascular: Negative for chest pain and palpitations.  Gastrointestinal: Negative for diarrhea and vomiting.  Genitourinary: Negative for dysuria and hematuria.  Musculoskeletal: Negative for arthralgias and back pain.  Skin: Negative for color change and rash.  Neurological: Negative for seizures, syncope and headaches.  All other systems reviewed and are negative.    Physical Exam Updated Vital Signs BP (!) 153/89 (BP  Location: Right Arm)   Pulse 86   Temp 98.2 F (36.8 C) (Oral)   Resp 17   SpO2 97%   Physical Exam  Constitutional: He appears well-developed and well-nourished.  HENT:  Head: Normocephalic and atraumatic.  Eyes: Conjunctivae are normal.  Neck: Neck supple.  Cardiovascular: Normal rate and regular rhythm.  No murmur heard. Pulmonary/Chest: Effort normal and breath sounds normal. No respiratory distress.  Abdominal: Soft. There is no tenderness. There is no rigidity and no guarding.  Musculoskeletal: He exhibits no edema or tenderness.  Neurological: He is alert.  Skin: Skin is warm and dry.  Psychiatric: He has a normal mood and affect.  Nursing note and vitals reviewed.    ED Treatments / Results  Labs (all labs ordered are listed, but only abnormal results are displayed) Labs Reviewed  URINALYSIS, ROUTINE W REFLEX MICROSCOPIC - Abnormal; Notable for the following components:      Result Value   Glucose, UA 50 (*)    Ketones, ur 20 (*)    All other components within normal limits  BASIC METABOLIC PANEL - Abnormal; Notable for the following components:   Glucose, Bld 173 (*)    Creatinine, Ser 1.34 (*)    GFR calc non Af Amer 52 (*)    GFR calc Af Amer 60 (*)    All other components within normal limits  CBC WITH DIFFERENTIAL/PLATELET    EKG  EKG Interpretation None       Radiology No results found.  Procedures Procedures (including critical care time)  Medications Ordered in ED Medications - No data to display   Initial Impression / Assessment and Plan / ED Course  I have reviewed the triage vital signs and the nursing notes.  Pertinent labs & imaging results that were available during my care of the patient were reviewed by me and considered in my medical decision making (see chart for details).  Clinical Course as of Oct 24 1628  Sat Oct 22, 2017  2221 I reviewed the patient's labs with him.  He is eating a sandwich and is taking p.o.  When I  informed him that there was no indication for admission he became very angry with me.  He asked me to call the police to arrest him because he can not go back to the shelter now.   [MB]    Clinical Course User Index [MB] Terrilee FilesButler, Constanza Mincy C, MD     Final Clinical Impressions(s) / ED Diagnoses   Final diagnoses:  Fatigue, unspecified type  Chills (without fever)    ED Discharge Orders    None       Terrilee FilesButler, Minh Roanhorse C, MD 10/24/17 1630

## 2017-10-22 NOTE — ED Notes (Signed)
Pt tolerating sandwich and drinks without difficulty. VSS. Sitting up watching TV at this time.

## 2017-10-22 NOTE — ED Notes (Signed)
Pt given fluids, aware that we need a urine sample, states unable at this time. Urinal at the bedside.

## 2017-10-22 NOTE — ED Triage Notes (Signed)
[facility] vitals: 138/96, 78HR, 18R, 99% on room air, cbg 137.

## 2017-10-22 NOTE — Discharge Instructions (Signed)
Your is seen in the emergency department for fatigue and chills.  Your exam and lab work were unremarkable.  You were eating and drinking here.  You were evaluated

## 2017-10-24 ENCOUNTER — Telehealth: Payer: Self-pay

## 2017-10-24 NOTE — Telephone Encounter (Signed)
Call received from [facility], RN/[facility] noting that the patient has not been at the shelter since going to the ED on 10/22/17. She later explained that [facility] as he has warrants for failure to appear.  She stated that she spoke to the nurse at the jail and they have all of the medications except for the onglyza.  Informed [place] that as per Venora MaplesLuke Van Ausdell, RPH/[facility], the pharmacy will order the medication and it will be ready for pick up tomorrow. This information was shared with [place].   [place] also noted that she will contact the patient's APS caseworker to update her on the situation with the patient.

## 2017-10-26 ENCOUNTER — Telehealth: Payer: Self-pay

## 2017-10-26 NOTE — Telephone Encounter (Signed)
[facility]. She was inquiring the role this clinic had with the patient. Infomred her that this is his PCP office and this CM has been working with Shann MedalVictoria Hussey, RN/[facility] to insure that the patient receives his medications and the care/supervision he needs to remain safe.  This CM also noted that we have been trying [facility] and secure [facility]/placement for him.  He currently has medicaid. An FL2 has not been done yet and she is aware that the [facility] is not [facility] not [facility].

## 2017-11-04 ENCOUNTER — Telehealth: Payer: Self-pay

## 2017-11-04 NOTE — Telephone Encounter (Signed)
As per Shann MedalVictoria Hussey, RN Richard Powers/[facility], the patient has a court date on 11/10/17. She was inquiring about plans for housing Richard Powers/assistance when/if he is released from prison.  Call [facility], DSS/APS #  [phone] to discuss options for care.  A HIPAA compliant voicemail message was left requesting a call back to # [phone].

## 2017-11-05 NOTE — Congregational Nurse Program (Signed)
Congregational Nurse Program Note  Date of Encounter: 10/11/2017  Past Medical History: Past Medical History:  Diagnosis Date  . Diabetes mellitus without complication (HCC)   . Hypertension     Encounter Details: CNP Questionnaire - 10/18/17 1010      Questionnaire   Patient Status  Not Applicable    Race  Black or African American    Location [facility]  [facility]    Insurance  Medicare    Uninsured  Not Applicable    Food  Yes, have food insecurities    Housing/Utilities  No permanent housing    Transportation  Yes, need transportation assistance    Interpersonal Safety  No, do not feel physically and emotionally safe where you currently live    Medication  Yes, have medication insecurities    Medical Provider  Yes    Referrals  Other    ED Visit Averted  Not Applicable    Life-Saving Intervention Made  Not Applicable      Clinical Intake - 10/17/17 1128      Pre-visit preparation   Pre-visit preparation completed  Yes      Pain   Pain   No/denies pain      Nutrition Screen   Diabetes  Yes    CBG done?  Yes    CBG resulted in Enter/ Edit results?  Yes    Did pt. bring in CBG monitor from home?  No      Functional Status   Activities of Daily Living  Independent    Ambulation  Independent    Medication Administration  Independent    Home Management  Independent      Abuse/Neglect   Do you feel unsafe in your current relationship?  No    Do you feel physically threatened by others?  No    Anyone hurting you at home, work, or school?  No    Unable to ask?  No      Web designerLanguage Assistant   Interpreter Needed?  No     BP/BG check. Reviewed medications with client. Client is unaware if he took his medications ut acts surprised when CN explains that he did not take his medication today. Cn explained that he is starting a new medication to help improve his memory.Client asks if CN thinks he has has memory problems. CN responded yes. Client took medication while in  clinic. Will follow up with Cn on Friday.

## 2017-11-07 NOTE — Congregational Nurse Program (Signed)
Congregational Nurse Program Note  Date of Encounter: 10/10/2017  Past Medical History: Past Medical History:  Diagnosis Date  . Diabetes mellitus without complication (HCC)   . Hypertension     Encounter Details: CNP Questionnaire - 10/18/17 1010      Questionnaire   Patient Status  Not Applicable    Race  Black or African American    Location [facility] Served At  [facility]    Insurance  Medicare    Uninsured  Not Applicable    Food  Yes, have food insecurities    Housing/Utilities  No permanent housing    Transportation  Yes, need transportation assistance    Interpersonal Safety  No, do not feel physically and emotionally safe where you currently live    Medication  Yes, have medication insecurities    Medical Provider  Yes    Referrals  Other    ED Visit Averted  Not Applicable    Life-Saving Intervention Made  Not Applicable      Clinical Intake - 10/17/17 1128      Pre-visit preparation   Pre-visit preparation completed  Yes      Pain   Pain   No/denies pain      Nutrition Screen   Diabetes  Yes    CBG done?  Yes    CBG resulted in Enter/ Edit results?  Yes    Did pt. bring in CBG monitor from home?  No      Functional Status   Activities of Daily Living  Independent    Ambulation  Independent    Medication Administration  Independent    Home Management  Independent      Abuse/Neglect   Do you feel unsafe in your current relationship?  No    Do you feel physically threatened by others?  No    Anyone hurting you at home, work, or school?  No    Unable to ask?  No      Web designerLanguage Assistant   Interpreter Needed?  No      Needed assistance with getting medications filled.  Medications filled and delivered to client

## 2017-11-07 NOTE — Telephone Encounter (Signed)
Call [facility] from [facility], APS returning this CM call. She stated that she has closed the patient's case. If/when he is released from prison, an FL2 will need to be completed and Scharlene CornLacey Long, SW/DSS # [phone] can be contacted to assist with placement in the appropriate facility for his medical needs.   Update provided to [name], RN/ [facility]

## 2017-11-16 ENCOUNTER — Encounter (HOSPITAL_COMMUNITY): Payer: Self-pay

## 2017-11-16 ENCOUNTER — Inpatient Hospital Stay (HOSPITAL_COMMUNITY): Payer: Medicare Other

## 2017-11-16 ENCOUNTER — Inpatient Hospital Stay (HOSPITAL_COMMUNITY)
Admission: EM | Admit: 2017-11-16 | Discharge: 2017-11-25 | DRG: 683 | Disposition: A | Discharge status: Skilled Nursing Facility | Payer: Medicare Other | Attending: Family Medicine | Admitting: Family Medicine

## 2017-11-16 ENCOUNTER — Other Ambulatory Visit: Payer: Self-pay

## 2017-11-16 ENCOUNTER — Emergency Department (HOSPITAL_COMMUNITY): Payer: Medicare Other

## 2017-11-16 DIAGNOSIS — L989 Disorder of the skin and subcutaneous tissue, unspecified: Secondary | ICD-10-CM | POA: Diagnosis present

## 2017-11-16 DIAGNOSIS — A419 Sepsis, unspecified organism: Secondary | ICD-10-CM | POA: Diagnosis not present

## 2017-11-16 DIAGNOSIS — R0600 Dyspnea, unspecified: Secondary | ICD-10-CM

## 2017-11-16 DIAGNOSIS — R0603 Acute respiratory distress: Secondary | ICD-10-CM

## 2017-11-16 DIAGNOSIS — Z6823 Body mass index (BMI) 23.0-23.9, adult: Secondary | ICD-10-CM | POA: Diagnosis not present

## 2017-11-16 DIAGNOSIS — I959 Hypotension, unspecified: Secondary | ICD-10-CM | POA: Diagnosis present

## 2017-11-16 DIAGNOSIS — E440 Moderate protein-calorie malnutrition: Secondary | ICD-10-CM | POA: Diagnosis present

## 2017-11-16 DIAGNOSIS — E119 Type 2 diabetes mellitus without complications: Secondary | ICD-10-CM | POA: Diagnosis present

## 2017-11-16 DIAGNOSIS — Z87891 Personal history of nicotine dependence: Secondary | ICD-10-CM

## 2017-11-16 DIAGNOSIS — I10 Essential (primary) hypertension: Secondary | ICD-10-CM | POA: Diagnosis present

## 2017-11-16 DIAGNOSIS — E870 Hyperosmolality and hypernatremia: Secondary | ICD-10-CM | POA: Diagnosis not present

## 2017-11-16 DIAGNOSIS — E860 Dehydration: Secondary | ICD-10-CM | POA: Diagnosis present

## 2017-11-16 DIAGNOSIS — E118 Type 2 diabetes mellitus with unspecified complications: Secondary | ICD-10-CM

## 2017-11-16 DIAGNOSIS — N179 Acute kidney failure, unspecified: Secondary | ICD-10-CM | POA: Diagnosis present

## 2017-11-16 DIAGNOSIS — R7989 Other specified abnormal findings of blood chemistry: Secondary | ICD-10-CM | POA: Diagnosis present

## 2017-11-16 DIAGNOSIS — F0390 Unspecified dementia without behavioral disturbance: Secondary | ICD-10-CM | POA: Diagnosis present

## 2017-11-16 DIAGNOSIS — G9340 Encephalopathy, unspecified: Secondary | ICD-10-CM | POA: Diagnosis present

## 2017-11-16 DIAGNOSIS — H409 Unspecified glaucoma: Secondary | ICD-10-CM | POA: Diagnosis present

## 2017-11-16 DIAGNOSIS — R627 Adult failure to thrive: Secondary | ICD-10-CM | POA: Diagnosis present

## 2017-11-16 DIAGNOSIS — E1129 Type 2 diabetes mellitus with other diabetic kidney complication: Secondary | ICD-10-CM

## 2017-11-16 LAB — COMPREHENSIVE METABOLIC PANEL
ALK PHOS: 42 U/L (ref 38–126)
ALT: 14 U/L — AB (ref 17–63)
ANION GAP: 23 — AB (ref 5–15)
AST: 29 U/L (ref 15–41)
Albumin: 4.2 g/dL (ref 3.5–5.0)
BILIRUBIN TOTAL: 1.5 mg/dL — AB (ref 0.3–1.2)
BUN: 101 mg/dL — ABNORMAL HIGH (ref 6–20)
CALCIUM: 10 mg/dL (ref 8.9–10.3)
CO2: 16 mmol/L — AB (ref 22–32)
CREATININE: 3.44 mg/dL — AB (ref 0.61–1.24)
Chloride: 105 mmol/L (ref 101–111)
GFR, EST AFRICAN AMERICAN: 19 mL/min — AB (ref >60)
GFR, EST NON AFRICAN AMERICAN: 17 mL/min — AB (ref >60)
Glucose, Bld: 224 mg/dL — ABNORMAL HIGH (ref 65–99)
Potassium: 4.6 mmol/L (ref 3.5–5.1)
SODIUM: 144 mmol/L (ref 135–145)
TOTAL PROTEIN: 8 g/dL (ref 6.5–8.1)

## 2017-11-16 LAB — CBC WITH DIFFERENTIAL/PLATELET
BASOS ABS: 0 10*3/uL (ref 0.0–0.1)
BASOS PCT: 0 %
EOS ABS: 0 10*3/uL (ref 0.0–0.7)
Eosinophils Relative: 1 %
HCT: 46.1 % (ref 39.0–52.0)
Hemoglobin: 15.6 g/dL (ref 13.0–17.0)
Lymphocytes Relative: 29 %
Lymphs Abs: 1.8 10*3/uL (ref 0.7–4.0)
MCH: 30.6 pg (ref 26.0–34.0)
MCHC: 33.8 g/dL (ref 30.0–36.0)
MCV: 90.4 fL (ref 78.0–100.0)
MONOS PCT: 6 %
Monocytes Absolute: 0.4 10*3/uL (ref 0.1–1.0)
Neutro Abs: 4 10*3/uL (ref 1.7–7.7)
Neutrophils Relative %: 64 %
Platelets: 218 10*3/uL (ref 150–400)
RBC: 5.1 MIL/uL (ref 4.22–5.81)
RDW: 13.6 % (ref 11.5–15.5)
WBC: 6.3 10*3/uL (ref 4.0–10.5)

## 2017-11-16 LAB — PROCALCITONIN

## 2017-11-16 LAB — URINALYSIS, ROUTINE W REFLEX MICROSCOPIC
BILIRUBIN URINE: NEGATIVE
Glucose, UA: NEGATIVE mg/dL
Hgb urine dipstick: NEGATIVE
KETONES UR: 20 mg/dL — AB
LEUKOCYTES UA: NEGATIVE
NITRITE: NEGATIVE
Protein, ur: NEGATIVE mg/dL
Specific Gravity, Urine: 1.021 (ref 1.005–1.030)
pH: 5 (ref 5.0–8.0)

## 2017-11-16 LAB — I-STAT CG4 LACTIC ACID, ED
LACTIC ACID, VENOUS: 5 mmol/L — AB (ref 0.5–1.9)
Lactic Acid, Venous: 4.55 mmol/L (ref 0.5–1.9)

## 2017-11-16 LAB — D-DIMER, QUANTITATIVE: D-Dimer, Quant: 7.02 ug/mL-FEU — ABNORMAL HIGH (ref 0.00–0.50)

## 2017-11-16 LAB — I-STAT TROPONIN, ED: TROPONIN I, POC: 0 ng/mL (ref 0.00–0.08)

## 2017-11-16 LAB — APTT: APTT: 25 s (ref 24–36)

## 2017-11-16 LAB — PROTIME-INR
INR: 1.17
PROTHROMBIN TIME: 14.8 s (ref 11.4–15.2)

## 2017-11-16 LAB — LIPASE, BLOOD: LIPASE: 79 U/L — AB (ref 11–51)

## 2017-11-16 LAB — CK: Total CK: 59 U/L (ref 49–397)

## 2017-11-16 LAB — TROPONIN I: Troponin I: <0.03 ng/mL (ref <0.03)

## 2017-11-16 MED ORDER — SIMVASTATIN 40 MG PO TABS
40.0000 mg | ORAL_TABLET | Freq: Every day | ORAL | Status: DC
  Administered 2017-11-17 – 2017-11-25 (×8): 40 mg via ORAL
  Filled 2017-11-16 (×9): qty 1

## 2017-11-16 MED ORDER — SODIUM CHLORIDE 0.9 % IV SOLN
INTRAVENOUS | Status: AC
  Administered 2017-11-16 – 2017-11-17 (×2): via INTRAVENOUS

## 2017-11-16 MED ORDER — THIAMINE HCL 100 MG/ML IJ SOLN
100.0000 mg | Freq: Every day | INTRAMUSCULAR | Status: DC
  Administered 2017-11-17 – 2017-11-18 (×2): 100 mg via INTRAVENOUS
  Filled 2017-11-16 (×2): qty 2

## 2017-11-16 MED ORDER — ONDANSETRON HCL 4 MG PO TABS: 4.0000 mg | ORAL_TABLET | Freq: Four times a day (QID) | ORAL | Status: DC | PRN

## 2017-11-16 MED ORDER — PIPERACILLIN-TAZOBACTAM IN DEX 2-0.25 GM/50ML IV SOLN
2.2500 g | Freq: Three times a day (TID) | INTRAVENOUS | Status: DC
  Administered 2017-11-16 – 2017-11-17 (×3): 2.25 g via INTRAVENOUS
  Filled 2017-11-16 (×5): qty 50

## 2017-11-16 MED ORDER — PIPERACILLIN-TAZOBACTAM 3.375 G IVPB 30 MIN
3.3750 g | Freq: Once | INTRAVENOUS | Status: AC
  Administered 2017-11-16: 3.375 g via INTRAVENOUS
  Filled 2017-11-16: qty 50

## 2017-11-16 MED ORDER — SODIUM CHLORIDE 0.9 % IV BOLUS (SEPSIS)
1000.0000 mL | Freq: Once | INTRAVENOUS | Status: AC
  Administered 2017-11-16: 1000 mL via INTRAVENOUS

## 2017-11-16 MED ORDER — VANCOMYCIN HCL 10 G IV SOLR
1250.0000 mg | Freq: Once | INTRAVENOUS | Status: AC
  Administered 2017-11-16: 1250 mg via INTRAVENOUS
  Filled 2017-11-16: qty 1250

## 2017-11-16 MED ORDER — KETOTIFEN FUMARATE 0.025 % OP SOLN
1.0000 [drp] | Freq: Two times a day (BID) | OPHTHALMIC | Status: DC
  Administered 2017-11-17 – 2017-11-24 (×9): 1 [drp] via OPHTHALMIC
  Filled 2017-11-16: qty 5

## 2017-11-16 MED ORDER — ACETAMINOPHEN 650 MG RE SUPP: 650.0000 mg | Freq: Four times a day (QID) | RECTAL | Status: DC | PRN

## 2017-11-16 MED ORDER — ACETAMINOPHEN 325 MG PO TABS
650.0000 mg | ORAL_TABLET | Freq: Four times a day (QID) | ORAL | Status: DC | PRN
  Administered 2017-11-18: 650 mg via ORAL
  Filled 2017-11-16: qty 2

## 2017-11-16 MED ORDER — SODIUM CHLORIDE 0.9 % IV BOLUS (SEPSIS)
250.0000 mL | Freq: Once | INTRAVENOUS | Status: AC
Start: 2017-11-16 — End: 2017-11-16
  Administered 2017-11-16: 250 mL via INTRAVENOUS

## 2017-11-16 MED ORDER — VANCOMYCIN HCL IN DEXTROSE 1-5 GM/200ML-% IV SOLN: 1000.0000 mg | Freq: Once | INTRAVENOUS | Status: DC

## 2017-11-16 MED ORDER — ENOXAPARIN SODIUM 30 MG/0.3ML ~~LOC~~ SOLN
30.0000 mg | Freq: Every day | SUBCUTANEOUS | Status: DC
  Administered 2017-11-17 – 2017-11-21 (×5): 30 mg via SUBCUTANEOUS
  Filled 2017-11-16 (×6): qty 0.3

## 2017-11-16 MED ORDER — HYDROCODONE-ACETAMINOPHEN 5-325 MG PO TABS
1.0000 | ORAL_TABLET | ORAL | Status: DC | PRN
  Administered 2017-11-24 (×2): 2 via ORAL
  Filled 2017-11-16 (×2): qty 2
  Filled 2017-11-16: qty 1

## 2017-11-16 MED ORDER — ALBUTEROL SULFATE (2.5 MG/3ML) 0.083% IN NEBU: 2.5000 mg | INHALATION_SOLUTION | RESPIRATORY_TRACT | Status: DC | PRN

## 2017-11-16 MED ORDER — DONEPEZIL HCL 10 MG PO TABS
10.0000 mg | ORAL_TABLET | Freq: Every day | ORAL | Status: DC
  Administered 2017-11-17 – 2017-11-24 (×9): 10 mg via ORAL
  Filled 2017-11-16 (×9): qty 1

## 2017-11-16 MED ORDER — GUAIFENESIN ER 600 MG PO TB12
600.0000 mg | ORAL_TABLET | Freq: Two times a day (BID) | ORAL | Status: DC
  Administered 2017-11-17 – 2017-11-25 (×16): 600 mg via ORAL
  Filled 2017-11-16 (×18): qty 1

## 2017-11-16 MED ORDER — ONDANSETRON HCL 4 MG/2ML IJ SOLN: 4.0000 mg | Freq: Four times a day (QID) | INTRAMUSCULAR | Status: DC | PRN

## 2017-11-16 MED ORDER — INSULIN ASPART 100 UNIT/ML ~~LOC~~ SOLN
0.0000 [IU] | SUBCUTANEOUS | Status: DC
  Administered 2017-11-17 (×2): 1 [IU] via SUBCUTANEOUS
  Administered 2017-11-17: 2 [IU] via SUBCUTANEOUS
  Administered 2017-11-17: 1 [IU] via SUBCUTANEOUS
  Administered 2017-11-18 – 2017-11-19 (×2): 2 [IU] via SUBCUTANEOUS
  Administered 2017-11-19: 1 [IU] via SUBCUTANEOUS
  Administered 2017-11-19 (×3): 2 [IU] via SUBCUTANEOUS
  Administered 2017-11-19: 1 [IU] via SUBCUTANEOUS
  Administered 2017-11-20 (×4): 2 [IU] via SUBCUTANEOUS
  Administered 2017-11-20 – 2017-11-21 (×4): 1 [IU] via SUBCUTANEOUS
  Administered 2017-11-21 (×2): 2 [IU] via SUBCUTANEOUS
  Administered 2017-11-21: 1 [IU] via SUBCUTANEOUS
  Administered 2017-11-22 (×2): 3 [IU] via SUBCUTANEOUS
  Administered 2017-11-22: 1 [IU] via SUBCUTANEOUS
  Administered 2017-11-23 (×2): 3 [IU] via SUBCUTANEOUS
  Administered 2017-11-23: 5 [IU] via SUBCUTANEOUS
  Administered 2017-11-23: 2 [IU] via SUBCUTANEOUS
  Administered 2017-11-24: 1 [IU] via SUBCUTANEOUS
  Administered 2017-11-24: 5 [IU] via SUBCUTANEOUS
  Administered 2017-11-24 (×3): 2 [IU] via SUBCUTANEOUS
  Administered 2017-11-25: 5 [IU] via SUBCUTANEOUS
  Administered 2017-11-25 (×2): 1 [IU] via SUBCUTANEOUS
  Administered 2017-11-25: 2 [IU] via SUBCUTANEOUS

## 2017-11-16 MED ORDER — VANCOMYCIN HCL IN DEXTROSE 1-5 GM/200ML-% IV SOLN: 1000.0000 mg | INTRAVENOUS | Status: DC

## 2017-11-16 NOTE — ED Notes (Signed)
Unable to draw labs from IV or straight stick, phlebotomy attempted x1 without success, trying to find another phlebotomist

## 2017-11-16 NOTE — Progress Notes (Signed)
Pharmacy Antibiotic Note  Richard Powers is a 72 y.o. male brought to ED in policy custody on 11/16/2017 with sepsis.  Pharmacy has been consulted for Vancomycin and Zosyn dosing.  LA elevated at 4.55. WBC is within normal limits. Patient temperature is low on admission and they are tachycardic. SCr is elevated at 3.44 with est CrCl ~ 18 mL/min.   Plan: Vancomycin 1250 mg IV x1 now then 1g IV every 48 hours.  Zosyn 3.375g IV x1 over 30 minutes, then Zosyn 2.25g IV every 8 hours.  Follow-up renal function changes, culture results, and clinical status.  Weight: 152 lb (68.9 kg)  Temp (24hrs), Avg:97.5 F (36.4 C), Min:97.5 F (36.4 C), Max:97.5 F (36.4 C)  Recent Labs  Lab 11/16/17 1356 11/16/17 1437  WBC 6.3  --   LATICACIDVEN  --  4.55*    CrCl cannot be calculated (Patient's most recent lab result is older than the maximum 21 days allowed.).    No Known Allergies  Antimicrobials this admission: Vancomycin 3/20>> Zosyn 3/20 >>  Dose adjustments this admission:   Microbiology results: 3/20 Bld Cx >> 3/20 Urine >>  Thank you for allowing pharmacy to be a part of this patient's care.  Richard Powers, PharmD, BCPS, BCCCP Clinical Pharmacist Clinical phone 11/16/2017 until 11PM [phone]- #[phone] After hours, please call #[phone] 11/16/2017 3:14 PM

## 2017-11-16 NOTE — H&P (Signed)
Richard Powers ZOX:[id number] DOB: [birth year] DOA: 11/16/2017     PCP: [name], MD   Outpatient Specialists: none      Patient arrived to ER on 11/16/17 at 1343  Patient coming from:  From facility [facility]  Chief Complaint:  Chief Complaint  Patient presents with  . Dehydration    HPI: Richard Powers is a 72 y.o. male with medical history significant of Dementia, DM 2, HTN    Presented with decreased p.o. Intake no vomiting but has been spitting up. [facility] after labs on 3/18were obtained worissome for acute renal failure.  Patient is screaming out. States he is not in pain but cannot breath and cannot feel anything. On RA oxygen saturation 100% while resting breathing comfortably.  Unable to provide detailed hx becoming agitated when questioned.  Last hospitalization was January 11-15 2019 for sepsis and dehydration due to influenza.  Patietn has been at [facility] since October 21 2017   While in ER: Initially hypotensive dow to SBP 70 HR up to 120. Lactic acid 4.5 blood cultures obtained and patient was started on emperic antibiotics.   Significant initial  Findings: Abnormal Labs Reviewed  COMPREHENSIVE METABOLIC PANEL - Abnormal; Notable for the following components:      Result Value   CO2 16 (*)    Glucose, Bld 224 (*)    BUN 101 (*)    Creatinine, Ser 3.44 (*)    ALT 14 (*)    Total Bilirubin 1.5 (*)    GFR calc non Af Amer 17 (*)    GFR calc Af Amer 19 (*)    Anion gap 23 (*)    All other components within normal limits  URINALYSIS, ROUTINE W REFLEX MICROSCOPIC - Abnormal; Notable for the following components:   Ketones, ur 20 (*)    All other components within normal limits  LIPASE, BLOOD - Abnormal; Notable for the following components:   Lipase 79 (*)    All other components within normal limits  I-STAT CG4 LACTIC ACID, ED - Abnormal; Notable for the following components:   Lactic Acid,  Venous 4.55 (*)    All other components within normal limits  I-STAT CG4 LACTIC ACID, ED - Abnormal; Notable for the following components:   Lactic Acid, Venous 5.00 (*)    All other components within normal limits   Lipase 79  Na 144 K 4.6  Cr    Up from baseline see below Lab Results  Component Value Date   CREATININE 3.44 (H) 11/16/2017   CREATININE 1.34 (H) 10/22/2017   CREATININE 1.27 (H) 10/20/2017   GFR: Estimated Creatinine Clearance: 18.4 mL/min (A) (by C-G formula based on SCr of 3.44 mg/dL (H)).  WBC  6.3  HG/HCT   Up from baseline see below    Component Value Date/Time   HGB 15.6 11/16/2017 1356   HCT 46.1 11/16/2017 1356    Lactic acid: 4.55->5.00   Troponins  0.00  UA   no evidence of UTI      CXR - NON acute   ECG:  Personally reviewed by me showing: HR :101 Rhythm: sinus tachycardai Ischemic changes  no evidence of ischemic changes QTC 457   Temp (24hrs), Avg:97.5 F (36.4 C), Min:97.5 F (36.4 C), Max:97.5 F (36.4 C) LABRCNTIP(cktotal:5,CKMB:5,ckmbindex:5,troponini:5) ED Triage Vitals  Enc Vitals Group     BP 11/16/17 1351 98/75     Pulse Rate 11/16/17 1351 (!) 142  Resp 11/16/17 1351 16     Temp 11/16/17 1351 (!) 97.5 F (36.4 C)     Temp Source 11/16/17 1351 Oral     SpO2 11/16/17 1351 92 %     Weight 11/16/17 1401 152 lb (68.9 kg)     Height --      Head Circumference --      Peak Flow --      Pain Score 11/16/17 1433 0     Pain Loc --      Pain Edu? --      Excl. in GC? --   TMAX(24)@     on arrival  ED Triage Vitals  Enc Vitals Group     BP 11/16/17 1351 98/75     Pulse Rate 11/16/17 1351 (!) 142     Resp 11/16/17 1351 16     Temp 11/16/17 1351 (!) 97.5 F (36.4 C)     Temp Source 11/16/17 1351 Oral     SpO2 11/16/17 1351 92 %     Weight 11/16/17 1401 152 lb (68.9 kg)     Height --      Head Circumference --      Peak Flow --      Pain Score 11/16/17 1433 0     Pain Loc --      Pain Edu? --      Excl.  in GC? --      Latest  Blood pressure 124/77, pulse 83, temperature (!) 97.5 F (36.4 C), temperature source Oral, resp. rate 20, weight 68.9 kg (152 lb), SpO2 100 %.   Following Medications were ordered in ER: Medications  vancomycin (VANCOCIN) IVPB 1000 mg/200 mL premix (not administered)  piperacillin-tazobactam (ZOSYN) IVPB 2.25 g (not administered)  sodium chloride 0.9 % bolus 1,000 mL (1,000 mLs Intravenous New Bag/Given 11/16/17 1724)    And  sodium chloride 0.9 % bolus 1,000 mL (0 mLs Intravenous Stopped 11/16/17 1728)    And  sodium chloride 0.9 % bolus 250 mL (0 mLs Intravenous Stopped 11/16/17 1747)  piperacillin-tazobactam (ZOSYN) IVPB 3.375 g (0 g Intravenous Stopped 11/16/17 1600)  vancomycin (VANCOCIN) 1,250 mg in sodium chloride 0.9 % 250 mL IVPB (1,250 mg Intravenous New Bag/Given 11/16/17 1630)       Hospitalist was called for admission for Dehydration  Regarding pertinent Chronic problems:  DM 2 Hg A1C 10.9 Hx of Dementia on Aricept  Review of Systems:    Pertinent positives include:  Fatigue, loss of appetite,  Constitutional:  No weight loss, night sweats, Fevers, chills, weight loss  HEENT:  No headaches, Difficulty swallowing,Tooth/dental problems,Sore throat,  No sneezing, itching, ear ache, nasal congestion, post nasal drip,  Cardio-vascular:  No chest pain, Orthopnea, PND, anasarca, dizziness, palpitations.no Bilateral lower extremity swelling  GI:  No heartburn, indigestion, abdominal pain, nausea, vomiting, diarrhea, change in bowel habits,  melena, blood in stool, hematemesis Resp:  no shortness of breath at rest. No dyspnea on exertion, No excess mucus, no productive cough, No non-productive cough, No coughing up of blood.No change in color of mucus.No wheezing. Skin:  no rash or lesions. No jaundice GU:  no dysuria, change in color of urine, no urgency or frequency. No straining to urinate.  No flank pain.  Musculoskeletal:  No joint pain or no  joint swelling. No decreased range of motion. No back pain.  Psych:  No change in mood or affect. No depression or anxiety. No memory loss.  Neuro: no localizing neurological complaints, no tingling,  no weakness, no double vision, no gait abnormality, no slurred speech, no confusion  As per HPI otherwise 10 point review of systems negative.   Past Medical History: Blood pressure 124/77, pulse 83, temperature (!) 97.5 F (36.4 C), temperature source Oral, resp. rate 20, weight 68.9 kg (152 lb), SpO2 100 %.EDMEDS Past Medical History:  Diagnosis Date  . Diabetes mellitus without complication (HCC)   . Hypertension      Social History:  Ambulatory   independently     History reviewed. No pertinent surgical history.    reports that he has quit smoking. he has never used smokeless tobacco. He reports that he drinks alcohol. He reports that he does not use drugs.    Allergies:  No Known Allergies   Family History: Refuses to answer any questions.   Prior to Admission medications   Medication Sig Start Date End Date Taking? Authorizing Provider  acetaminophen (TYLENOL) 500 MG tablet Take 1 tablet (500 mg total) by mouth every 6 (six) hours as needed. 09/13/17   Esperanza Sheets, MD  Brinzolamide-Brimonidine (SIMBRINZA) 1-0.2 % SUSP Apply 1 drop to eye 3 (three) times daily.    [provider]  Carboxymethylcellulose Sodium 0.25 % SOLN Apply 1 drop to eye 3 (three) times daily.    [provider]  donepezil (ARICEPT) 10 MG tablet Take 1 tablet (10 mg total) by mouth at bedtime. 10/21/17   [name], MD  hydrochlorothiazide (HYDRODIURIL) 25 MG tablet Take 1 tablet (25 mg total) by mouth daily. 10/21/17   [name], MD  ketotifen (ZADITOR) 0.025 % ophthalmic solution Place 1 drop into both eyes 2 (two) times daily.    [provider]  lisinopril (PRINIVIL,ZESTRIL) 40 MG tablet Take 1 tablet (40 mg total) by mouth daily. 10/21/17   [name],  MD  saxagliptin HCl (ONGLYZA) 2.5 MG TABS tablet Take 1 tablet (2.5 mg total) by mouth daily. 10/21/17   [name], MD  simvastatin (ZOCOR) 40 MG tablet Take 1 tablet (40 mg total) by mouth daily. 10/21/17   [name], MD    Physical Exam: Prior to Admission medications   Medication Sig Start Date End Date Taking? Authorizing Provider  acetaminophen (TYLENOL) 500 MG tablet Take 1 tablet (500 mg total) by mouth every 6 (six) hours as needed. 09/13/17   Esperanza Sheets, MD  Brinzolamide-Brimonidine (SIMBRINZA) 1-0.2 % SUSP Apply 1 drop to eye 3 (three) times daily.    [provider]  Carboxymethylcellulose Sodium 0.25 % SOLN Apply 1 drop to eye 3 (three) times daily.    [provider]  donepezil (ARICEPT) 10 MG tablet Take 1 tablet (10 mg total) by mouth at bedtime. 10/21/17   [name], MD  hydrochlorothiazide (HYDRODIURIL) 25 MG tablet Take 1 tablet (25 mg total) by mouth daily. 10/21/17   [name], MD  ketotifen (ZADITOR) 0.025 % ophthalmic solution Place 1 drop into both eyes 2 (two) times daily.    [provider]  lisinopril (PRINIVIL,ZESTRIL) 40 MG tablet Take 1 tablet (40 mg total) by mouth daily. 10/21/17   [name], MD  saxagliptin HCl (ONGLYZA) 2.5 MG TABS tablet Take 1 tablet (2.5 mg total) by mouth daily. 10/21/17   [name], MD  simvastatin (ZOCOR) 40 MG tablet Take 1 tablet (40 mg total) by mouth daily. 10/21/17   [name], MD    1. General:  in No Acute distress  Chronically ill appearing 2. Psychological: Alert and   Oriented to self  3. Head/ENT:    Dry Mucous Membranes                          Head Non traumatic, neck supple                          Poor Dentition 4. SKIN:  decreased Skin turgor,  Skin clean Dry and intact no rash 5. Heart: Regular rate and rhythm no Murmur, no Rub or gallop 6. Lungs:  Clear to auscultation bilaterally, no wheezes or crackles   7. Abdomen: Soft,  non-tender, Non distended   8. Lower extremities: no clubbing, cyanosis, or edema 9. Neurologically Grossly intact, moving all 4 extremities equally  10. MSK: Normal range of motion   Patient Vitals for the past 24 hrs:  BP Temp Temp src Pulse Resp SpO2 Weight  11/16/17 1745 124/77 - - 83 20 100 % -  11/16/17 1730 111/79 - - 86 16 100 % -  11/16/17 1715 107/70 - - 94 20 100 % -  11/16/17 1630 123/82 - - 92 (!) 25 100 % -  11/16/17 1615 107/81 - - 96 20 100 % -  11/16/17 1600 - - - 94 16 99 % -  11/16/17 1530 - - - 95 (!) 24 99 % -  11/16/17 1433 (!) 87/72 - - (!) 106 (!) 22 98 % -  11/16/17 1401 - - - - - - 68.9 kg (152 lb)  11/16/17 1351 98/75 (!) 97.5 F (36.4 C) Oral (!) 142 16 92 % -  BMIP Recent Labs  Lab 11/16/17 1356  WBC 6.3  NEUTROABS 4.0  HGB 15.6  HCT 46.1  MCV 90.4  PLT 218   Basic Metabolic Panel: Recent Labs  Lab 11/16/17 1356  NA 144  K 4.6  CL 105  CO2 16*  GLUCOSE 224*  BUN 101*  CREATININE 3.44*  CALCIUM 10.0  LABRCNTIP(wbc:5,neutroabs:5,hgb:5,hct:5,mcv:5,plt:5) Recent Labs  Lab 11/16/17 1356  NA 144  K 4.6  CL 105  CO2 16*  GLUCOSE 224*  BUN 101*  CREATININE 3.44*  CALCIUM 10.0  LABRCNTIP(ast:5,ALT:5,alkphos:5,bilitot:5,prot:5,albumin:5) Recent Labs  Lab 11/16/17 1621  LIPASE 79*       Urine analysis:    Component Value Date/Time   COLORURINE YELLOW 11/16/2017 1630   APPEARANCEUR CLEAR 11/16/2017 1630   LABSPEC 1.021 11/16/2017 1630   PHURINE 5.0 11/16/2017 1630   GLUCOSEU NEGATIVE 11/16/2017 1630   HGBUR NEGATIVE 11/16/2017 1630   BILIRUBINUR NEGATIVE 11/16/2017 1630   KETONESUR 20 (A) 11/16/2017 1630   PROTEINUR NEGATIVE 11/16/2017 1630   UROBILINOGEN 0.2 02/22/2015 2331   NITRITE NEGATIVE 11/16/2017 1630   LEUKOCYTESUR NEGATIVE 11/16/2017 1630   Sepsis Labs:  )No results found for this or any previous visit (from the past 240 hour(s)).    Lab Results  Component Value Date   HGBA1C 10.9 09/15/2017    Estimated Creatinine Clearance: 18.4  mL/min (A) (by C-G formula based on SCr of 3.44 mg/dL (H)).    Filed Weights   11/16/17 1401  Weight: 68.9 kg (152 lb)     Cultures:    Component Value Date/Time   SDES BLOOD BLOOD RIGHT HAND 09/09/2017 2115   SPECREQUEST AEROBIC BOTTLE ONLY Blood Culture adequate volume 09/09/2017 2115   CULT NO GROWTH 5 DAYS 09/09/2017 2115   REPTSTATUS 09/14/2017 FINAL 09/09/2017 2115     Radiological Exams on Admission: Dg Chest 2 View  Result Date: 11/16/2017  CLINICAL DATA:  Altered mental status. EXAM: CHEST - 2 VIEW COMPARISON:  PA and lateral chest 10/20/2017 and 09/12/2017. CT chest 02/23/2015. FINDINGS: Lungs are clear. Heart size is normal. No pneumothorax or pleural effusion. Remote left rib fractures noted. IMPRESSION: No acute disease. Electronically Signed   By: Drusilla Kannerhomas  Dalessio M.D.   On: 11/16/2017 17:14   Dg Chest Port 1 View  Result Date: 11/16/2017 CLINICAL DATA:  Dyspnea today. EXAM: PORTABLE CHEST 1 VIEW COMPARISON:  PA and lateral chest 11/16/2017. Single-view of the chest 02/22/2015. CT chest 02/23/2015. FINDINGS: The lungs are clear. Heart size is normal. Aortic atherosclerosis is noted. No pneumothorax or pleural effusion. Remote left rib fractures are seen. IMPRESSION: No acute disease. Atherosclerosis. Electronically Signed   By: Drusilla Kannerhomas  Dalessio M.D.   On: 11/16/2017 20:27    Chart has been reviewed    Assessment/Plan  72 y.o. male with medical history significant of Dementia, DM 2, HTN    Admitted for Dehydration and AKI  Present on Admission: . Acute encephalopathy -  - most likely multifactorial secondary to combination of  dehydration secondary to decreased by mouth intake and Uremia baseline dementia   - Will rehydrate  - treat underlining infection, I will await results of blood cultures given initial hypotension and elevated lactic acid continue IV antibiotics but suspect this most likely secondary to dehydration  - Hold contributing medications    - if  no improvement may need further imaging to evaluate for CNS pathology  . Dementia unknown baseline   . Essential hypertension -hold lisinopril allow permissive hypertension hold home medications for now . Dehydration rehydrate . AKI (acute kidney injury) (HCC) - - likely secondary to dehydration, check FeNA and if not improved with IVF consider renal consult.  would obtain renal US   . Elevated lactic acid level most likely secondary to dehydration we will continue to rehydrate and follow  Hypotensive and tachycardic initially but more likely secondary to dehydration doubt to sepsis blood cultures were negative discontinue IV antibiotics  Recurrent dyspnea suspect psychological given significant fluctuation in symptoms normal vital signs and physical exam but given the patient received IV fluids will check repeat chest x-ray showed no change we will cycle cardiac enzymes.  Patient unable to provide detailed history to exclude cardiac cause.  Given initial hypotension, elevated lactic acid unstable psychological status will observe and stepdown for tonight dissipate being able to transfer to telemetry regular floor tomorrow   DM2 -  - Order Sensitive  SI     -  check TSH and HgA1C  - Hold by mouth medications   Other plan as per orders.  DVT prophylaxis:   Lovenox     Code Status:  FULL CODE presumed patient unable to answer no family at bedsiede  Family Communication:   Family not at  Bedside    Disposition Plan:        Back to current facility when stable                                                Would benefit from PT/OT eval prior to DC  Consults called: none    Admission status   Inpatient    Level of care    SDU      I have spent a total of 56 min on this admission   Jeryl Wilbourn 11/16/2017, 8:47 PM    [facility]  Pager [phone]   after 2 AM please page floor coverage PA If 7AM-7PM, please contact  the day team taking care of the patient  [email or web address]  Password [email or web address]

## 2017-11-16 NOTE — ED Provider Notes (Signed)
[facility] EMERGENCY DEPARTMENT Provider Note   CSN: [id number] Arrival date & time: 11/16/17  1343     History   Chief Complaint Chief Complaint  Patient presents with  . Dehydration    HPI Richard Powers is a 72 y.o. male.  HPI Pt [facility] to the ED from prison with a diagnosis of acute kidney injury.  Pt has not been eating or drinking well for the last week.  He states he has been nauseated and not wanting to eat.  He denies having any pain.  No chest pain or abdominal pain.  No diarrhea.  Pt has been spitting up mucus but not vomiting.   No known fevers.  No cough or sore throat.  [facility] the jail and was sent to the ED after having an increased cr of 3.44/ Past Medical History:  Diagnosis Date  . Diabetes mellitus without complication (HCC)   . Hypertension     Patient Active Problem List   Diagnosis Date Noted  . Dehydration 11/16/2017  . AKI (acute kidney injury) (HCC) 11/16/2017  . Glaucoma 10/17/2017  . Dementia 09/15/2017  . Sepsis (HCC) 09/09/2017  . Essential hypertension 09/09/2017  . DM II (diabetes mellitus, type II), controlled (HCC) 09/09/2017    History reviewed. No pertinent surgical history.     Home Medications    Prior to Admission medications   Medication Sig Start Date End Date Taking? Authorizing Provider  acetaminophen (TYLENOL) 500 MG tablet Take 1 tablet (500 mg total) by mouth every 6 (six) hours as needed. 09/13/17   Esperanza Sheets, MD  Brinzolamide-Brimonidine (SIMBRINZA) 1-0.2 % SUSP Apply 1 drop to eye 3 (three) times daily.    [provider]  Carboxymethylcellulose Sodium 0.25 % SOLN Apply 1 drop to eye 3 (three) times daily.    [provider]  donepezil (ARICEPT) 10 MG tablet Take 1 tablet (10 mg total) by mouth at bedtime. 10/21/17   [name], MD  hydrochlorothiazide (HYDRODIURIL) 25 MG tablet Take 1 tablet (25 mg total) by mouth daily. 10/21/17   [name], MD  ketotifen (ZADITOR) 0.025 % ophthalmic solution Place 1 drop into both eyes 2 (two) times daily.    [provider]  lisinopril (PRINIVIL,ZESTRIL) 40 MG tablet Take 1 tablet (40 mg total) by mouth daily. 10/21/17   [name], MD  saxagliptin HCl (ONGLYZA) 2.5 MG TABS tablet Take 1 tablet (2.5 mg total) by mouth daily. 10/21/17   [name], MD  simvastatin (ZOCOR) 40 MG tablet Take 1 tablet (40 mg total) by mouth daily. 10/21/17   [name], MD    Family History No family history on file.  Social History Social History   Tobacco Use  . Smoking status: Former Games developer  . Smokeless tobacco: Never Used  Substance Use Topics  . Alcohol use: Yes  . Drug use: No     Allergies   Patient has no known allergies.   Review of Systems Review of Systems  Constitutional: Negative for fever.  Gastrointestinal: Negative for abdominal pain.  Genitourinary: Negative for difficulty urinating.       No incontinence  Neurological: Positive for weakness.       No saddle anesthesia  All other systems reviewed and are negative.    Physical Exam Updated Vital Signs BP 124/77   Pulse 83   Temp (!) 97.5 F (36.4 C) (Oral)   Resp 20   Wt 68.9 kg (152 lb)  SpO2 100%   BMI 23.81 kg/m   Physical Exam  Constitutional: He appears listless. No distress.  HENT:  Head: Normocephalic and atraumatic.  Right Ear: External ear normal.  Left Ear: External ear normal.  Eyes: Conjunctivae are normal. Right eye exhibits no discharge. Left eye exhibits no discharge. No scleral icterus.  Neck: Neck supple. No tracheal deviation present.  Mm dry  Cardiovascular: Normal rate, regular rhythm and intact distal pulses.  Pulmonary/Chest: Effort normal and breath sounds normal. No stridor. No respiratory distress. He has no wheezes. He has no rales.  Abdominal: Soft. Bowel sounds are normal. He exhibits no distension. There is no tenderness. There is no rebound and no  guarding.  Musculoskeletal: He exhibits no edema or tenderness.  Neurological: He appears listless. No cranial nerve deficit (no facial droop, extraocular movements intact, no slurred speech) or sensory deficit. He exhibits normal muscle tone. He displays no seizure activity. Coordination normal.  Generalized weakness  Skin: Skin is warm and dry. No rash noted.  Psychiatric: He has a normal mood and affect.  Nursing note and vitals reviewed.    ED Treatments / Results  Labs (all labs ordered are listed, but only abnormal results are displayed) Labs Reviewed  COMPREHENSIVE METABOLIC PANEL - Abnormal; Notable for the following components:      Result Value   CO2 16 (*)    Glucose, Bld 224 (*)    BUN 101 (*)    Creatinine, Ser 3.44 (*)    ALT 14 (*)    Total Bilirubin 1.5 (*)    GFR calc non Af Amer 17 (*)    GFR calc Af Amer 19 (*)    Anion gap 23 (*)    All other components within normal limits  URINALYSIS, ROUTINE W REFLEX MICROSCOPIC - Abnormal; Notable for the following components:   Ketones, ur 20 (*)    All other components within normal limits  LIPASE, BLOOD - Abnormal; Notable for the following components:   Lipase 79 (*)    All other components within normal limits  I-STAT CG4 LACTIC ACID, ED - Abnormal; Notable for the following components:   Lactic Acid, Venous 4.55 (*)    All other components within normal limits  I-STAT CG4 LACTIC ACID, ED - Abnormal; Notable for the following components:   Lactic Acid, Venous 5.00 (*)    All other components within normal limits  CULTURE, BLOOD (ROUTINE X 2)  URINE CULTURE  CULTURE, BLOOD (ROUTINE X 2) W REFLEX TO ID PANEL  CBC WITH DIFFERENTIAL/PLATELET  I-STAT TROPONIN, ED    EKG  EKG Interpretation  Date/Time:  Wednesday November 16 2017 14:46:56 EDT Ventricular Rate:  101 PR Interval:    QRS Duration: 87 QT Interval:  354 QTC Calculation: 457 R Axis:   96 Text Interpretation:  Sinus rhythm Right axis deviation  Borderline low voltage, extremity leads Borderline ST elevation, anterolateral leads Poor data quality Confirmed by Linwood Dibbles [phone]) on 11/16/2017 3:15:15 PM       Radiology Dg Chest 2 View  Result Date: 11/16/2017 CLINICAL DATA:  Altered mental status. EXAM: CHEST - 2 VIEW COMPARISON:  PA and lateral chest 10/20/2017 and 09/12/2017. CT chest 02/23/2015. FINDINGS: Lungs are clear. Heart size is normal. No pneumothorax or pleural effusion. Remote left rib fractures noted. IMPRESSION: No acute disease. Electronically Signed   By: Drusilla Kanner M.D.   On: 11/16/2017 17:14    Procedures .Critical Care Performed by: Linwood Dibbles, MD Authorized by: Linwood Dibbles,  MD   Critical care provider statement:    Critical care time (minutes):  45   Critical care was time spent personally by me on the following activities:  Discussions with consultants, evaluation of patient's response to treatment, examination of patient, ordering and performing treatments and interventions, ordering and review of laboratory studies, ordering and review of radiographic studies, pulse oximetry, re-evaluation of patient's condition, obtaining history from patient or surrogate and review of old charts   (including critical care time)  Medications Ordered in ED Medications  vancomycin (VANCOCIN) IVPB 1000 mg/200 mL premix (not administered)  piperacillin-tazobactam (ZOSYN) IVPB 2.25 g (not administered)  sodium chloride 0.9 % bolus 1,000 mL (1,000 mLs Intravenous New Bag/Given 11/16/17 1724)    And  sodium chloride 0.9 % bolus 1,000 mL (0 mLs Intravenous Stopped 11/16/17 1728)    And  sodium chloride 0.9 % bolus 250 mL (0 mLs Intravenous Stopped 11/16/17 1747)  piperacillin-tazobactam (ZOSYN) IVPB 3.375 g (0 g Intravenous Stopped 11/16/17 1600)  vancomycin (VANCOCIN) 1,250 mg in sodium chloride 0.9 % 250 mL IVPB (1,250 mg Intravenous New Bag/Given 11/16/17 1630)     Initial Impression / Assessment and Plan / ED Course  I  have reviewed the triage vital signs and the nursing notes.  Pertinent labs & imaging results that were available during my care of the patient were reviewed by me and considered in my medical decision making (see chart for details).  Clinical Course as of Nov 17 1839  Wed Nov 16, 2017  1514 Labs ordered at triage.  Lactic acid level elevated and BP is low.  ? Sepsis although could be related to dehydration renal failure.  No clear infectious etiology at this time.  [JK]  1646 Lactic acid remains elevated.  Fluids ordered.  Abx ordered.  Blood pressure improving.  Sepsis reassessment completed.  [JK]  1750 UTI does not suggest a uti.  CXR without pna.  No source of infection.  Lactic acidosis may be related to his dehydration and aki  [JK]    Clinical Course User Index [JK] Linwood DibblesKnapp, Brandun Pinn, MD    Patient presented to the emergency room for evaluation of acute kidney injury.  Patient initially presented hypotensive.  He responded to IV fluids.  Most recent blood pressure was in the 120s systolic.  Patient does have an elevated lactic acid level however no signs of infection.  Empiric antibiotics were started however I think the patient is not actually septic but is severely dehydrated lactic acid level associated with his acute kidney injury.  Plan on continuing IV fluid.  Consult the medical service for admission and further treatment.  Final Clinical Impressions(s) / ED Diagnoses   Final diagnoses:  Acute kidney injury (HCC)  Dehydration      Linwood DibblesKnapp, Kairi Harshbarger, MD 11/16/17 [phone]

## 2017-11-16 NOTE — ED Notes (Signed)
IV team trying to get another IV again, first IV is only able to handle antibiotics and slow IV fluid, pt has gotten antibiotics and 300 ml normal saline

## 2017-11-16 NOTE — ED Provider Notes (Signed)
Patient placed in Quick Look pathway, seen and evaluated   Chief Complaint: acute kidney injury from prison  HPI:   Per report pt not eating or drinking normally, labs were drawn remarkable for BUN 94, creatinine 3.44, GFR 20, Na 149, K 5.3. Ho DM with insulin and HTN. No h/o kidney disease.  ROS: Unable to accurately obtain from patient or prison staff.   Physical Exam:   Gen: No distress  Neuro: Awake and Alert  Skin: Warm    Focused Exam: Hypotensive, tachycardic. SBP 70-80s, HR 120-140s. Alert and oriented to self, place, time but cannot specifically tell me why he's here states he is in pain but can't point to where. Otherwise able to hold brief conversation, speaking in full sentences. Dry lips. Cold extremities. Decreased breath sounds to lower lobes. Abdomen NTND. No CVAT.  I have asked triage RN to place pt in acute bed. Initiation of care has begun. One set of blood cultures obtained in triage. The patient has been counseled on the process, plan, and necessity for staying for the completion/evaluation, and the remainder of the medical screening examination    Liberty HandyGibbons, Claudia J, PA-C 11/16/17 1405

## 2017-11-16 NOTE — ED Triage Notes (Signed)
Pt presents to the ed in police custody, with complaints of not eating or drinking like he is supposed to, they did labs on him and he was found to be in renal failure with elevated BUN and Creatinine.  Pt is alert and oriented, pt will not specify complaints.

## 2017-11-17 ENCOUNTER — Other Ambulatory Visit: Payer: Self-pay

## 2017-11-17 ENCOUNTER — Inpatient Hospital Stay (HOSPITAL_COMMUNITY): Payer: Medicare Other

## 2017-11-17 DIAGNOSIS — A419 Sepsis, unspecified organism: Secondary | ICD-10-CM

## 2017-11-17 LAB — COMPREHENSIVE METABOLIC PANEL
ALK PHOS: 31 U/L — AB (ref 38–126)
ALT: 15 U/L — ABNORMAL LOW (ref 17–63)
AST: 22 U/L (ref 15–41)
Albumin: 3.2 g/dL — ABNORMAL LOW (ref 3.5–5.0)
Anion gap: 14 (ref 5–15)
BILIRUBIN TOTAL: 1.9 mg/dL — AB (ref 0.3–1.2)
BUN: 86 mg/dL — AB (ref 6–20)
CALCIUM: 8.2 mg/dL — AB (ref 8.9–10.3)
CHLORIDE: 116 mmol/L — AB (ref 101–111)
CO2: 19 mmol/L — ABNORMAL LOW (ref 22–32)
CREATININE: 3.21 mg/dL — AB (ref 0.61–1.24)
GFR calc non Af Amer: 18 mL/min — ABNORMAL LOW (ref >60)
GFR, EST AFRICAN AMERICAN: 21 mL/min — AB (ref >60)
Glucose, Bld: 175 mg/dL — ABNORMAL HIGH (ref 65–99)
Potassium: 3.3 mmol/L — ABNORMAL LOW (ref 3.5–5.1)
Sodium: 149 mmol/L — ABNORMAL HIGH (ref 135–145)
TOTAL PROTEIN: 6.2 g/dL — AB (ref 6.5–8.1)

## 2017-11-17 LAB — CBC
HCT: 38.8 % — ABNORMAL LOW (ref 39.0–52.0)
Hemoglobin: 12.7 g/dL — ABNORMAL LOW (ref 13.0–17.0)
MCH: 29.7 pg (ref 26.0–34.0)
MCHC: 32.7 g/dL (ref 30.0–36.0)
MCV: 90.9 fL (ref 78.0–100.0)
PLATELETS: 152 10*3/uL (ref 150–400)
RBC: 4.27 MIL/uL (ref 4.22–5.81)
RDW: 13.9 % (ref 11.5–15.5)
WBC: 8.6 10*3/uL (ref 4.0–10.5)

## 2017-11-17 LAB — URINE CULTURE: Culture: NO GROWTH

## 2017-11-17 LAB — LACTIC ACID, PLASMA
LACTIC ACID, VENOUS: 1.9 mmol/L (ref 0.5–1.9)
Lactic Acid, Venous: 1.7 mmol/L (ref 0.5–1.9)

## 2017-11-17 LAB — HEMOGLOBIN A1C
Hgb A1c MFr Bld: 9.6 % — ABNORMAL HIGH (ref 4.8–5.6)
Mean Plasma Glucose: 228.82 mg/dL

## 2017-11-17 LAB — GLUCOSE, CAPILLARY
GLUCOSE-CAPILLARY: 101 mg/dL — AB (ref 65–99)
GLUCOSE-CAPILLARY: 124 mg/dL — AB (ref 65–99)
GLUCOSE-CAPILLARY: 147 mg/dL — AB (ref 65–99)
GLUCOSE-CAPILLARY: 172 mg/dL — AB (ref 65–99)
Glucose-Capillary: 116 mg/dL — ABNORMAL HIGH (ref 65–99)
Glucose-Capillary: 127 mg/dL — ABNORMAL HIGH (ref 65–99)

## 2017-11-17 LAB — TSH: TSH: 0.969 u[IU]/mL (ref 0.350–4.500)

## 2017-11-17 LAB — TROPONIN I: Troponin I: <0.03 ng/mL (ref <0.03)

## 2017-11-17 LAB — MAGNESIUM: MAGNESIUM: 2.5 mg/dL — AB (ref 1.7–2.4)

## 2017-11-17 LAB — MRSA PCR SCREENING: MRSA by PCR: NEGATIVE

## 2017-11-17 LAB — PHOSPHORUS: Phosphorus: 4.7 mg/dL — ABNORMAL HIGH (ref 2.5–4.6)

## 2017-11-17 LAB — CREATININE, URINE, RANDOM: CREATININE, URINE: 227.04 mg/dL

## 2017-11-17 LAB — SODIUM, URINE, RANDOM: SODIUM UR: 43 mmol/L

## 2017-11-17 MED ORDER — TECHNETIUM TO 99M ALBUMIN AGGREGATED
4.2700 | Freq: Once | INTRAVENOUS | Status: AC | PRN
  Administered 2017-11-17: 4.27 via INTRAVENOUS

## 2017-11-17 MED ORDER — TECHNETIUM TC 99M DIETHYLENETRIAME-PENTAACETIC ACID
32.3000 | Freq: Once | INTRAVENOUS | Status: AC | PRN
  Administered 2017-11-17: 32.3 via RESPIRATORY_TRACT

## 2017-11-17 MED ORDER — POLYVINYL ALCOHOL 1.4 % OP SOLN
1.0000 [drp] | OPHTHALMIC | Status: DC | PRN
  Filled 2017-11-17: qty 15

## 2017-11-17 MED ORDER — SODIUM CHLORIDE 0.9 % IV SOLN
INTRAVENOUS | Status: DC
  Administered 2017-11-17 – 2017-11-18 (×3): via INTRAVENOUS

## 2017-11-17 MED ORDER — CARBOXYMETHYLCELLULOSE SODIUM 0.25 % OP SOLN: 1.0000 [drp] | Freq: Three times a day (TID) | OPHTHALMIC | Status: DC

## 2017-11-17 NOTE — Evaluation (Signed)
Physical Therapy Evaluation Patient Details Name: Richard Powers MRN: [id number] DOB: [birth year] Today's Date: 11/17/2017   History of Present Illness  Richard Powers is a 72 y.o. male admitted from jail with medical history significant of Dementia, DM 2, HTN. Found to have Acute encephalopathy, Dehydration and AKI  Clinical Impression   Patient received sleeping in bed, law enforcement officer present and reports he has just gotten back from testing, seems very worn out following tests this morning. Patient able to be woken and did participate in functional bed mobility and transfers with assistance, however became agitated in standing, did not progress mobility today due to agitation. Suspect that higher levels of assist may be due to combination of fatigue from a busy morning as well as patient possibly not understanding cues/not wanting to get out of bed. Plan to continue to work with patient during this acute stay, and recommend that he receive whatever skilled PT services he is able to in the incarcerated/prison setting; will clarify possible assistive device recommendations as mobility progresses.     Follow Up Recommendations Other (comment)(whatever skilled PT services is available to patient in incarcerated setting )    Equipment Recommendations  Other (comment)(TBD)    Recommendations for Other Services       Precautions / Restrictions Precautions Precautions: Fall;Other (comment) Precaution Comments: can become agitated especially when fatigued  Restrictions Weight Bearing Restrictions: No      Mobility  Bed Mobility Overal bed mobility: Needs Assistance Bed Mobility: Supine to Sit;Sit to Supine Rolling: Independent Sidelying to sit: Min assist Supine to sit: Min assist Sit to supine: Min assist   General bed mobility comments: MinA, possibly due to lethargy, possibly due to patient not understanding PT cues/what I wanted him to do    Transfers Overall transfer level: Needs assistance Equipment used: 1 person hand held assist Transfers: Sit to/from Stand Sit to Stand: Mod assist         General transfer comment: ModA to stand, unsure if patient did not want to or if it was difficulty following cues, patient became agitated and PT returned patient to sitting   Ambulation/Gait             General Gait Details: DNT, patient agitated   Stairs            Wheelchair Mobility    Modified Rankin (Stroke Patients Only)       Balance Overall balance assessment: Needs assistance Sitting-balance support: No upper extremity supported;Feet supported Sitting balance-Leahy Scale: Good     Standing balance support: No upper extremity supported Standing balance-Leahy Scale: Poor                               Pertinent Vitals/Pain Pain Assessment: Faces Pain Score: 0-No pain Faces Pain Scale: No hurt Pain Intervention(s): Limited activity within patient's tolerance;Monitored during session    Home Living Family/patient expects to be discharged to:: Dentention/Prison                 Additional Comments: Pt in medical unit at prison    Prior Function Level of Independence: Independent         Comments: per pt he ambulated without AD and did his own ADLs, Hydrographic surveyorlaw enforcement officer confirms this      Hand Dominance   Dominant Hand: Right    Extremity/Trunk Assessment   Upper Extremity Assessment Upper Extremity Assessment: Defer to  OT evaluation    Lower Extremity Assessment Lower Extremity Assessment: Generalized weakness    Cervical / Trunk Assessment Cervical / Trunk Assessment: Kyphotic  Communication   Communication: Other (comment)(mumbles at times, makes lots of frequent moans and groans )  Cognition Arousal/Alertness: Awake/alert Behavior During Therapy: (became agitated with PT with standing from bed, otherwise [facility] ) Overall Cognitive Status: No  family/caregiver present to determine baseline cognitive functioning(dementia at baseline ) Area of Impairment: Orientation;Problem solving;Safety/judgement                 Orientation Level: (Initially thought he was [facility] once cued that it was the other one and started with a M he got [facility])       Safety/Judgement: Decreased awareness of safety;Decreased awareness of deficits   Problem Solving: Requires verbal cues        General Comments      Exercises     Assessment/Plan    PT Assessment Patient needs continued PT services  PT Problem List Decreased strength;Decreased mobility;Decreased coordination;Decreased balance       PT Treatment Interventions DME instruction;Therapeutic activities;Gait training;Therapeutic exercise;Patient/family education;Stair training;Balance training;Functional mobility training;Neuromuscular re-education    PT Goals (Current goals can be found in the Care Plan section)  Acute Rehab PT Goals Patient Stated Goal: to be warm  PT Goal Formulation: With patient    Frequency Min 3X/week   Barriers to discharge        Co-evaluation               AM-PAC PT "6 Clicks" Daily Activity  Outcome Measure Difficulty turning over in bed (including adjusting bedclothes, sheets and blankets)?: Unable Difficulty moving from lying on back to sitting on the side of the bed? : Unable Difficulty sitting down on and standing up from a chair with arms (e.g., wheelchair, bedside commode, etc,.)?: Unable Help needed moving to and from a bed to chair (including a wheelchair)?: A Little Help needed walking in hospital room?: A Little Help needed climbing 3-5 steps with a railing? : A Lot 6 Click Score: 11    End of Session Equipment Utilized During Treatment: Gait belt Activity Tolerance: Patient limited by lethargy;Treatment limited secondary to agitation Patient left: in bed;with call bell/phone within reach;Other (comment)(law Engineer, manufacturing systems  present )   PT Visit Diagnosis: Unsteadiness on feet (R26.81);Muscle weakness (generalized) (M62.81);Difficulty in walking, not elsewhere classified (R26.2)    Time: 4782-9562 PT Time Calculation (min) (ACUTE ONLY): 13 min   Charges:   PT Evaluation $PT Eval Moderate Complexity: 1 Mod     PT G Codes:        Nedra Hai PT, DPT, CBIS  Supplemental Physical Therapist [facility]   Pager [phone]

## 2017-11-17 NOTE — Progress Notes (Signed)
PROGRESS NOTE  Richard RockersWilliam Thomas VeronaXXXBrown Powers OZH:[id number] DOB: 1946/08/23 DOA: 11/16/2017 PCP: [name], [name], MD  HPI/Recap of past 3624 hours: [age] year old male with medical history significant for dementia, DM type 2, hypertension, currently incarcerated, presents to the ED with acute renal failure after labs on 3/18 were obtained from jail showed elevated creatinine likely due to poor p.o. Intake.  In the ED patient was noted to be hypotensive, tachycardic, with elevated lactic acid.  Patient was subsequently hydrated, and started on empiric antibiotics.  Patient noted to be a poor historian.  Patient has [facility] in [place] [facility] since October 21, 2017.  Last hospitalization was September 09, 2017 for sepsis and dehydration due to influenza.  Patient admitted for further management  Today, patient denies any new complaints.  Patient with poor eye contact.  Denies any chest pain, shortness of breath, abdominal pain, headaches.   Assessment/Plan: Active Problems:   Essential hypertension   DM II (diabetes mellitus, type II), controlled (HCC)   Dementia   Dehydration   AKI (acute kidney injury) (HCC)   Acute encephalopathy   Elevated lactic acid level  ??Sepsis, unlikely Currently afebrile, no leukocytosis, improved BP s/p IVF Hypotensive, tachycardic, elevated LA on admission Unlikely sepsis, likely due to severe dehydration Lactic acid trended down to normal, s/p 2.5L of IVF Pro-calcitonin negative UA negative for infection, urine culture no growth Blood culture no growth till date Chest x-ray negative We will discontinue empiric IV Zosyn plus vancomycin Continue IVF  AKI BUN and creatinine improving status post IVF Likely due to dehydration Renal ultrasound normal Continue IVF Strict I's and O's, avoid nephrotoxins, hold home lisinopril + HCT Daily BMP  Elevated d-dimer D-dimer 7.02 VQ scan negative for PE  Type 2 diabetes mellitus A1c 9.6 Sensitive SSI,  Accu-Cheks Held home metformin, saxagliptin, insulin regular  Hypertension Stable Held lisinopril + HCT due to AKI + hypotension on admission May start norvasc if BP uncontrolled  Dementia Continue aricept    Code Status: Full  Family Communication: None at bedside  Disposition Plan: Back to jail once creatinine back to baseline   Consultants:  None  Procedures:  None  Antimicrobials:  S/P IV vancomycin plus Zosyn  DVT prophylaxis: Lovenox  Objective: Vitals:   11/17/17 0015 11/17/17 0410 11/17/17 0811 11/17/17 1211  BP:  120/78 128/72 131/75  Pulse:  94 93 65  Resp:  (!) 9 13 10   Temp: 98.4 F (36.9 C) 98.4 F (36.9 C) 97.8 F (36.6 C) 98.1 F (36.7 C)  TempSrc:  Oral Oral Oral  SpO2:  96% 99% 93%  Weight:        Intake/Output Summary (Last 24 hours) at 11/17/2017 1439 Last data filed at 11/17/2017 0300 Gross per 24 hour  Intake 3066.67 ml  Output -  Net 3066.67 ml   Filed Weights   11/16/17 1401  Weight: 68.9 kg (152 lb)    Exam:   General: NAD, poor eye contact  Cardiovascular: S1, S2 present  Respiratory: CTAB  Abdomen: Soft, nontender, nondistended, bowel sounds present  Musculoskeletal: No pedal edema bilaterally  Skin: Normal  Psychiatry: Unable to assess   Data Reviewed: CBC: Recent Labs  Lab 11/16/17 1356 11/17/17 0133  WBC 6.3 8.6  NEUTROABS 4.0  --   HGB 15.6 12.7*  HCT 46.1 38.8*  MCV 90.4 90.9  PLT 218 152   Basic Metabolic Panel: Recent Labs  Lab 11/16/17 1356 11/17/17 0133 11/17/17 0819  NA 144 149*  --   K 4.6  3.3*  --   CL 105 116*  --   CO2 16* 19*  --   GLUCOSE 224* 175*  --   BUN 101* 86*  --   CREATININE 3.44* 3.21*  --   CALCIUM 10.0 8.2*  --   MG  --   --  2.5*  PHOS  --   --  4.7*   GFR: Estimated Creatinine Clearance: 19.7 mL/min (A) (by C-G formula based on SCr of 3.21 mg/dL (H)). Liver Function Tests: Recent Labs  Lab 11/16/17 1356 11/17/17 0133  AST 29 22  ALT 14* 15*   ALKPHOS 42 31*  BILITOT 1.5* 1.9*  PROT 8.0 6.2*  ALBUMIN 4.2 3.2*   Recent Labs  Lab 11/16/17 1621  LIPASE 79*   No results for input(s): AMMONIA in the last 168 hours. Coagulation Profile: Recent Labs  Lab 11/16/17 2205  INR 1.17   Cardiac Enzymes: Recent Labs  Lab 11/16/17 2205 11/17/17 0133 11/17/17 0819  CKTOTAL 59  --   --   TROPONINI <0.03 <0.03 <0.03   BNP (last 3 results) No results for input(s): PROBNP in the last 8760 hours. HbA1C: Recent Labs    11/17/17 0133  HGBA1C 9.6*   CBG: Recent Labs  Lab 11/17/17 0027 11/17/17 0431 11/17/17 0830 11/17/17 1217  GLUCAP 172* 116* 127* 147*   Lipid Profile: No results for input(s): CHOL, HDL, LDLCALC, TRIG, CHOLHDL, LDLDIRECT in the last 72 hours. Thyroid Function Tests: Recent Labs    11/17/17 0133  TSH 0.969   Anemia Panel: No results for input(s): VITAMINB12, FOLATE, FERRITIN, TIBC, IRON, RETICCTPCT in the last 72 hours. Urine analysis:    Component Value Date/Time   COLORURINE YELLOW 11/16/2017 1630   APPEARANCEUR CLEAR 11/16/2017 1630   LABSPEC 1.021 11/16/2017 1630   PHURINE 5.0 11/16/2017 1630   GLUCOSEU NEGATIVE 11/16/2017 1630   HGBUR NEGATIVE 11/16/2017 1630   BILIRUBINUR NEGATIVE 11/16/2017 1630   KETONESUR 20 (A) 11/16/2017 1630   PROTEINUR NEGATIVE 11/16/2017 1630   UROBILINOGEN 0.2 02/22/2015 2331   NITRITE NEGATIVE 11/16/2017 1630   LEUKOCYTESUR NEGATIVE 11/16/2017 1630   Sepsis Labs: @LABRCNTIP (procalcitonin:4,lacticidven:4)  ) Recent Results (from the past 240 hour(s))  Blood Culture (routine x 2)     Status: None (Preliminary result)   Collection Time: 11/16/17  1:56 PM  Result Value Ref Range Status   Specimen Description BLOOD LEFT ANTECUBITAL  Final   Special Requests   Final    BOTTLES DRAWN AEROBIC ONLY Blood Culture results may not be optimal due to an inadequate volume of blood received in culture bottles   Culture   Final    NO GROWTH < 24 HOURS Performed at  [facility], [address], [place], [place] [address]    Report Status PENDING  Incomplete  Urine culture     Status: None   Collection Time: 11/16/17  4:30 PM  Result Value Ref Range Status   Specimen Description URINE, RANDOM  Final   Special Requests NONE  Final   Culture   Final    NO GROWTH Performed at [facility], [address] [place], [place] [address]    Report Status 11/17/2017 FINAL  Final  Culture, blood (Routine X 2) w Reflex to ID Panel     Status: None (Preliminary result)   Collection Time: 11/16/17 10:00 PM  Result Value Ref Range Status   Specimen Description BLOOD RIGHT HAND  Final   Special Requests IN PEDIATRIC BOTTLE Blood  Culture adequate volume  Final   Culture   Final    NO GROWTH < 24 HOURS Performed at [facility], [address] [place], [place] [address]    Report Status PENDING  Incomplete  MRSA PCR Screening     Status: None   Collection Time: 11/17/17 12:48 AM  Result Value Ref Range Status   MRSA by PCR NEGATIVE NEGATIVE Final    Comment:        The GeneXpert MRSA Assay (FDA approved for NASAL specimens only), is one component of a comprehensive MRSA colonization surveillance program. It is not intended to diagnose MRSA infection nor to guide or monitor treatment for MRSA infections. Performed at [facility], [address], [place], [place] [address]       Studies: Dg Chest 2 View  Result Date: 11/16/2017 CLINICAL DATA:  Altered mental status. EXAM: CHEST - 2 VIEW COMPARISON:  PA and lateral chest 10/20/2017 and 09/12/2017. CT chest 02/23/2015. FINDINGS: Lungs are clear. Heart size is normal. No pneumothorax or pleural effusion. Remote left rib fractures noted. IMPRESSION: No acute disease. Electronically Signed   By: Drusilla Kanner M.D.   On: 11/16/2017 17:14   US Renal  Result Date: 11/16/2017 CLINICAL DATA:  Acute renal failure EXAM: RENAL / URINARY TRACT ULTRASOUND COMPLETE COMPARISON:  CT  02/22/2015 FINDINGS: Right Kidney: Length: 9.1 cm. Echogenicity within normal limits. No mass or hydronephrosis visualized. Left Kidney: Length: 8.6 cm. Echogenicity within normal limits. No mass or hydronephrosis visualized. Bladder: Appears normal for degree of bladder distention. IMPRESSION: Negative renal ultrasound Electronically Signed   By: Jasmine Pang M.D.   On: 11/16/2017 23:21   Nm Pulmonary Perf And Vent  Result Date: 11/17/2017 CLINICAL DATA:  Recurrent dyspnea.  Positive D-dimer. EXAM: NUCLEAR MEDICINE VENTILATION - PERFUSION LUNG SCAN TECHNIQUE: Ventilation images were obtained in multiple projections using inhaled aerosol Tc-42m DTPA. Perfusion images were obtained in multiple projections after intravenous injection of Tc-64m-MAA. RADIOPHARMACEUTICALS:  32.3 mCi of Tc-37m DTPA aerosol inhalation and 4.27 mCi Tc80m-MAA IV COMPARISON:  Chest x-ray dated 11/16/2017 FINDINGS: Ventilation: Normal. Perfusion: No wedge shaped peripheral perfusion defects to suggest acute pulmonary embolism. Normal perfusion bilaterally. IMPRESSION: Normal ventilation-perfusion lung scan. No findings suggestive of pulmonary embolism. Electronically Signed   By: Francene Boyers M.D.   On: 11/17/2017 13:16   Dg Chest Port 1 View  Result Date: 11/16/2017 CLINICAL DATA:  Dyspnea today. EXAM: PORTABLE CHEST 1 VIEW COMPARISON:  PA and lateral chest 11/16/2017. Single-view of the chest 02/22/2015. CT chest 02/23/2015. FINDINGS: The lungs are clear. Heart size is normal. Aortic atherosclerosis is noted. No pneumothorax or pleural effusion. Remote left rib fractures are seen. IMPRESSION: No acute disease. Atherosclerosis. Electronically Signed   By: Drusilla Kanner M.D.   On: 11/16/2017 20:27    Scheduled Meds: . donepezil  10 mg Oral QHS  . enoxaparin (LOVENOX) injection  30 mg Subcutaneous Daily  . guaiFENesin  600 mg Oral BID  . insulin aspart  0-9 Units Subcutaneous Q4H  . ketotifen  1 drop Both Eyes BID  .  simvastatin  40 mg Oral Daily  . thiamine  100 mg Intravenous Daily    Continuous Infusions: . sodium chloride 100 mL/hr at 11/17/17 1236  . piperacillin-tazobactam (ZOSYN)  IV 2.25 g (11/17/17 1422)  . [START ON 11/18/2017] vancomycin       LOS: 1 day     Briant Cedar, MD [facility]   If 7PM-7AM, please contact  night-coverage [email or web address] Password [facility] 11/17/2017, 2:39 PM

## 2017-11-17 NOTE — Progress Notes (Signed)
PT Cancellation Note  Patient Details Name: Richard Powers MRN: [id number] DOB: 10/24/1945   Cancelled Treatment:    Reason Eval/Treat Not Completed: Patient at procedure or test/unavailable  Attempted to work with patient however per RN he is at pulmonary testing; plan to attempt to return later in day as/if schedule allows.    [name] PT, DPT, CBIS  Supplemental Physical Therapist [facility]   Pager [phone]

## 2017-11-17 NOTE — Progress Notes (Signed)
Patient arrived to the unit via bed from the emergency department. Emergency planning/management officerolice officer at bedside.  Patient  Is alert and oriented x 2.  No complaints of pain.  Skin assessment complete.  No skin issues. IV intact to the left hand and left forearm.  Educated the patient on how to reach the staff on the unit.  Lowered the bed. Activated the bed alarm and placed the call light within reach.  Will continue to monitor the patient

## 2017-11-17 NOTE — Evaluation (Addendum)
Occupational Therapy Evaluation Patient Details Name: Richard Powers MRN: [id number] DOB: 01-20-1946 Today's Date: 11/17/2017    History of Present Illness [name] is a 72 y.o. male admitted from jail with medical history significant of Dementia, DM 2, HTN. Found to have Acute encephalopathy, Dehydration and AKI   Clinical Impression   This 72 yo male admitted with above presents to acute OT with decreased balance (as well as in shackles)  thus affecting his safety and independence with basic ADLs. He will benefit from acute OT without need for follow up.    Follow Up Recommendations  No OT follow up;Supervision/Assistance - 24 hour    Equipment Recommendations  None recommended by OT       Precautions / Restrictions Precautions Precautions: Fall Restrictions Weight Bearing Restrictions: No      Mobility Bed Mobility Overal bed mobility: Needs Assistance Bed Mobility: Rolling;Sidelying to Sit Rolling: Independent Sidelying to sit: Min assist       General bed mobility comments: Not sure if pt did not understand what I was asking him to do or that he was cold and did not want to get up  Transfers Overall transfer level: Needs assistance Equipment used: (pushing IV pole) Transfers: Sit to/from Stand Sit to Stand: Min guard         General transfer comment: Ambulated with min A using IV pole (pt reports he does not use any AD)    Balance Overall balance assessment: Needs assistance Sitting-balance support: No upper extremity supported;Feet supported Sitting balance-Leahy Scale: Good     Standing balance support: Single extremity supported Standing balance-Leahy Scale: Poor                             ADL either performed or assessed with clinical judgement   ADL Overall ADL's : Needs assistance/impaired Eating/Feeding: Independent;Sitting   Grooming: Set up;Sitting;Supervision/safety   Upper Body Bathing: Set  up;Sitting;Supervision/ safety   Lower Body Bathing: Minimal assistance Lower Body Bathing Details (indicate cue type and reason): min guard A sit<>stand Upper Body Dressing : Set up;Supervision/safety;Sitting   Lower Body Dressing: Minimal assistance Lower Body Dressing Details (indicate cue type and reason): min guard A sit<>stand Toilet Transfer: Minimal assistance;Ambulation Toilet Transfer Details (indicate cue type and reason): pushing IV pole Toileting- Clothing Manipulation and Hygiene: Min guard;Sit to/from stand         General ADL Comments: pt did refuse to adjust his sock that was loose on his foot. He did slip his own shoes on     Vision Patient Visual Report: No change from baseline              Pertinent Vitals/Pain Pain Assessment: No/denies pain     Hand Dominance Right   Extremity/Trunk Assessment Upper Extremity Assessment Upper Extremity Assessment: Overall WFL for tasks assessed   Lower Extremity Assessment Lower Extremity Assessment: Defer to PT evaluation   Cervical / Trunk Assessment Cervical / Trunk Assessment: Normal   Communication Communication Communication: (mumbles at times, so I had to ask him to repeat some information)   Cognition Arousal/Alertness: Awake/alert Behavior During Therapy: (A couple of times he yelled out an answer when I did not understand him the first time) Overall Cognitive Status: No family/caregiver present to determine baseline cognitive functioning(does have dementia at baseline) Area of Impairment: Orientation;Problem solving;Safety/judgement                 Orientation  Level: (Initially thought he was [facility] once cued that it was the other one and started with a M he got [facility])       Safety/Judgement: Decreased awareness of safety   Problem Solving: Requires verbal cues(would get wheels of pole stuck on objects in room and needed A/cues to correct)                Home Living Family/patient expects  to be discharged to:: Dent[facility]Prison                                 Additional Comments: Pt in medical unit at prison      Prior Functioning/Environment Level of Independence: Independent        Comments: per pt he ambulated without AD and did his own ADLs        OT Problem List: Impaired balance (sitting and/or standing)      OT Treatment/Interventions: Self-care/ADL training;Balance training;Patient/family education    OT Goals(Current goals can be found in the care plan section) Acute Rehab OT Goals Patient Stated Goal: to be warm and find a place to sit (while he was up walking with me) OT Goal Formulation: With patient Time For Goal Achievement: 12/01/17 Potential to Achieve Goals: Good ADL Goals Pt Will Perform Grooming: with supervision;standing Pt Will Transfer to Toilet: with supervision;ambulating;regular height toilet;grab bars Pt Will Perform Toileting - Clothing Manipulation and hygiene: with supervision;sit to/from stand Additional ADL Goal #1: pt will be S in and OOB for basic ADLs  OT Frequency: Min 2X/week              AM-PAC PT "6 Clicks" Daily Activity     Outcome Measure Help from another person eating meals?: None Help from another person taking care of personal grooming?: A Little Help from another person toileting, which includes using toliet, bedpan, or urinal?: A Little Help from another person bathing (including washing, rinsing, drying)?: A Little Help from another person to put on and taking off regular upper body clothing?: A Little Help from another person to put on and taking off regular lower body clothing?: A Little 6 Click Score: 19   End of Session Equipment Utilized During Treatment: Gait belt(pushing IV pole)  Activity Tolerance: Patient tolerated treatment well Patient left: in chair;with call bell/phone within reach(with police officer in room)  OT Visit Diagnosis: Unsteadiness on feet (R26.81);Other symptoms  and signs involving cognitive function                Time: [id number] OT Time Calculation (min): 29 min Charges:  OT General Charges $OT Visit: 1 Visit OT Evaluation $OT Eval Moderate Complexity: 1 Mod OT Treatments $Self Care/Home Management : 8-22 mins Ignacia Palma, OTR/L [phone] 11/17/2017

## 2017-11-18 LAB — BASIC METABOLIC PANEL
Anion gap: 16 — ABNORMAL HIGH (ref 5–15)
BUN: 62 mg/dL — ABNORMAL HIGH (ref 6–20)
CHLORIDE: 117 mmol/L — AB (ref 101–111)
CO2: 20 mmol/L — AB (ref 22–32)
CREATININE: 2.76 mg/dL — AB (ref 0.61–1.24)
Calcium: 9 mg/dL (ref 8.9–10.3)
GFR calc non Af Amer: 22 mL/min — ABNORMAL LOW (ref >60)
GFR, EST AFRICAN AMERICAN: 25 mL/min — AB (ref >60)
Glucose, Bld: 103 mg/dL — ABNORMAL HIGH (ref 65–99)
POTASSIUM: 4 mmol/L (ref 3.5–5.1)
SODIUM: 153 mmol/L — AB (ref 135–145)

## 2017-11-18 LAB — CBC WITH DIFFERENTIAL/PLATELET
BASOS PCT: 0 %
Basophils Absolute: 0 10*3/uL (ref 0.0–0.1)
Eosinophils Absolute: 0.1 10*3/uL (ref 0.0–0.7)
Eosinophils Relative: 1 %
HCT: 42.5 % (ref 39.0–52.0)
HEMOGLOBIN: 13.3 g/dL (ref 13.0–17.0)
LYMPHS ABS: 1.1 10*3/uL (ref 0.7–4.0)
Lymphocytes Relative: 19 %
MCH: 29 pg (ref 26.0–34.0)
MCHC: 31.3 g/dL (ref 30.0–36.0)
MCV: 92.6 fL (ref 78.0–100.0)
MONOS PCT: 7 %
Monocytes Absolute: 0.4 10*3/uL (ref 0.1–1.0)
NEUTROS PCT: 73 %
Neutro Abs: 4.4 10*3/uL (ref 1.7–7.7)
Platelets: 139 10*3/uL — ABNORMAL LOW (ref 150–400)
RBC: 4.59 MIL/uL (ref 4.22–5.81)
RDW: 13.8 % (ref 11.5–15.5)
WBC: 6 10*3/uL (ref 4.0–10.5)

## 2017-11-18 LAB — LIPID PANEL
CHOL/HDL RATIO: 2.7 ratio
CHOLESTEROL: 136 mg/dL (ref 0–200)
HDL: 50 mg/dL (ref >40)
LDL CALC: 62 mg/dL (ref 0–99)
TRIGLYCERIDES: 121 mg/dL (ref <150)
VLDL: 24 mg/dL (ref 0–40)

## 2017-11-18 LAB — GLUCOSE, CAPILLARY
GLUCOSE-CAPILLARY: 104 mg/dL — AB (ref 65–99)
GLUCOSE-CAPILLARY: 110 mg/dL — AB (ref 65–99)
GLUCOSE-CAPILLARY: 94 mg/dL (ref 65–99)
Glucose-Capillary: 85 mg/dL (ref 65–99)
Glucose-Capillary: 132 mg/dL — ABNORMAL HIGH (ref 65–99)
Glucose-Capillary: 151 mg/dL — ABNORMAL HIGH (ref 65–99)

## 2017-11-18 MED ORDER — DEXTROSE 5 % IV SOLN
INTRAVENOUS | Status: DC
  Administered 2017-11-18 – 2017-11-20 (×3): via INTRAVENOUS

## 2017-11-18 MED ORDER — VITAMIN B-1 100 MG PO TABS
100.0000 mg | ORAL_TABLET | Freq: Every day | ORAL | Status: DC
  Administered 2017-11-19 – 2017-11-25 (×6): 100 mg via ORAL
  Filled 2017-11-18 (×7): qty 1

## 2017-11-18 NOTE — Progress Notes (Signed)
Patient right wrist red from hand cuffs requests officer to remove the cuff and patient was agreeable to remain compliant.

## 2017-11-18 NOTE — Progress Notes (Signed)
Patient wrist looks better today less redness and swelling. Patient remain uncuffed at the hans and feet are cuffed to the bed.

## 2017-11-18 NOTE — Progress Notes (Signed)
Occupational Therapy Treatment Patient Details Name: Richard Powers XXXBrown III MRN: [id number] DOB: 06-Jul-1946 Today's Date: 11/18/2017    History of present illness Richard LanesWilliam Powers XXXBrown III is a 72 y.o. male admitted from jail with medical history significant of Dementia, DM 2, HTN. Found to have Acute encephalopathy, Dehydration and AKI   OT comments  Patient in bed upon arrival; alert, pleasant and willing to participate with therapy this afternoon. He is able to complete functional bed mobility with min guard,however requires HHA of 1-2 for functional transfers and gait due to general unsteadiness today. Able to ambulate approximately [phone] in room and then asked "to sit down"; patient left in recliner chair with alarm set and OT assisted pt with lunch tray set up. GC law enforcement present this afternoon.     Follow Up Recommendations  No OT follow up;Supervision/Assistance - 24 hour    Equipment Recommendations  None recommended by OT    Recommendations for Other Services      Precautions / Restrictions Precautions Precautions: Fall;Other (comment) Precaution Comments: can become agitated especially when fatigued  Restrictions Weight Bearing Restrictions: No       Mobility Bed Mobility Overal bed mobility: Needs Assistance Bed Mobility: Supine to Sit     Supine to sit: Min guard     General bed mobility comments: min guard for line management and safety  Transfers Overall transfer level: Needs assistance Equipment used: 1 person hand held assist Transfers: Sit to/from Stand Sit to Stand: Min guard         General transfer comment: min guard for full stand today, patient reliant on HHA support to stabilize however     Balance Overall balance assessment: Needs assistance Sitting-balance support: No upper extremity supported;Feet supported Sitting balance-Leahy Scale: Good     Standing balance support: No upper extremity supported Standing  balance-Leahy Scale: Poor                             ADL either performed or assessed with clinical judgement   ADL Overall ADL's : Needs assistance/impaired Eating/Feeding: Sitting;Set up   Grooming: Min guard;Standing                   Toilet Transfer: Ambulation;Min Pension scheme managerguard Toilet Transfer Details (indicate cue type and reason): simulated to recliner Toileting- Clothing Manipulation and Hygiene: Min guard;Sit to/from stand               Vision Patient Visual Report: No change from baseline     Perception     Praxis      Cognition Arousal/Alertness: Awake/alert Behavior During Therapy: WFL for tasks assessed/performed Overall Cognitive Status: No family/caregiver present to determine baseline cognitive functioning Area of Impairment: Orientation;Problem solving;Safety/judgement                 Orientation Level: Person;Place;Situation       Safety/Judgement: Decreased awareness of safety;Decreased awareness of deficits   Problem Solving: Requires verbal cues          Exercises     Shoulder Instructions       General Comments      Pertinent Vitals/ Pain       Pain Assessment: No/denies pain Pain Score: 0-No pain Faces Pain Scale: No hurt Pain Intervention(s): Monitored during session  Home Living  Prior Functioning/Environment              Frequency  Min 2X/week        Progress Toward Goals  OT Goals(current goals can now be found in the care plan section)  Progress towards OT goals: Progressing toward goals  Acute Rehab OT Goals Patient Stated Goal: to be warm   Plan Discharge plan remains appropriate    Co-evaluation    PT/OT/SLP Co-Evaluation/Treatment: Yes Reason for Co-Treatment: For patient/therapist safety;Necessary to address cognition/behavior during functional activity PT goals addressed during session: Mobility/safety with  mobility OT goals addressed during session: ADL's and self-care      AM-PAC PT "6 Clicks" Daily Activity     Outcome Measure   Help from another person eating meals?: None Help from another person taking care of personal grooming?: A Little Help from another person toileting, which includes using toliet, bedpan, or urinal?: A Little Help from another person bathing (including washing, rinsing, drying)?: A Little Help from another person to put on and taking off regular upper body clothing?: A Little Help from another person to put on and taking off regular lower body clothing?: A Little 6 Click Score: 19    End of Session Equipment Utilized During Treatment: Gait belt  OT Visit Diagnosis: Unsteadiness on feet (R26.81);Other symptoms and signs involving cognitive function   Activity Tolerance Patient tolerated treatment well   Patient Left in chair;with call bell/phone within reach;with chair alarm set;Other (comment)(GC Emergency planning/management officer)   Nurse Communication      Functional Assessment Tool Used: AM-PAC 6 Clicks Daily Activity   Time: [id number] OT Time Calculation (min): 24 min  Charges: OT G-codes **NOT FOR INPATIENT CLASS** Functional Assessment Tool Used: AM-PAC 6 Clicks Daily Activity OT General Charges $OT Visit: 1 Visit OT Treatments $Self Care/Home Management : 8-22 mins     Galen Manila 11/18/2017, 2:00 PM

## 2017-11-18 NOTE — Progress Notes (Signed)
Physical Therapy Treatment Patient Details Name: Richard Powers XXXBrown III MRN: [id number] DOB: 1946-06-30 Today's Date: 11/18/2017    History of Present Illness Dot LanesWilliam Powers XXXBrown III is a 72 y.o. male admitted from jail with medical history significant of Dementia, DM 2, HTN. Found to have Acute encephalopathy, Dehydration and AKI    PT Comments    Patient received in bed, pleasant and willing to participate with therapy this afternoon. He is able to complete functional bed mobility with min guard,however requires HHA of 1-2 for functional transfers and gait due to general unsteadiness today. Able to ambulate approximately [phone] in room and then asked "to sit down"; patient left in recliner chair with alarm set and OT and law enforcement present this afternoon. Recommend that he receive rolling walker to assist with gait due to balance deficit and associated potential fall risk.     Follow Up Recommendations  Other (comment)(whatever PT is available to patient in incarcerated setting )     Equipment Recommendations  Rolling walker with 5" wheels    Recommendations for Other Services       Precautions / Restrictions Precautions Precautions: Fall;Other (comment) Precaution Comments: can become agitated especially when fatigued  Restrictions Weight Bearing Restrictions: No    Mobility  Bed Mobility Overal bed mobility: Needs Assistance Bed Mobility: Supine to Sit     Supine to sit: Min guard     General bed mobility comments: min guard for line management and safety  Transfers Overall transfer level: Needs assistance Equipment used: 1 person hand held assist Transfers: Sit to/from Stand Sit to Stand: Min guard         General transfer comment: min guard for full stand today, patient reliant on HHA support to stabilize however   Ambulation/Gait Ambulation/Gait assistance: Min assist Ambulation Distance (Feet): 20 Feet Assistive device: 2 person hand held  assist Gait Pattern/deviations: Step-through pattern;Decreased step length - right;Decreased step length - left;Decreased stride length;Trunk flexed     General Gait Details: gait pattern limited by shackles, patient generally unsteady and reliant on 2 person HHA for balance assist during gait today    Stairs            Wheelchair Mobility    Modified Rankin (Stroke Patients Only)       Balance Overall balance assessment: Needs assistance Sitting-balance support: No upper extremity supported;Feet supported Sitting balance-Leahy Scale: Good     Standing balance support: No upper extremity supported Standing balance-Leahy Scale: Poor                              Cognition Arousal/Alertness: Awake/alert Behavior During Therapy: WFL for tasks assessed/performed Overall Cognitive Status: No family/caregiver present to determine baseline cognitive functioning Area of Impairment: Orientation;Problem solving;Safety/judgement                 Orientation Level: Person;Place;Situation       Safety/Judgement: Decreased awareness of safety;Decreased awareness of deficits   Problem Solving: Requires verbal cues        Exercises      General Comments        Pertinent Vitals/Pain Pain Assessment: Faces Pain Score: 0-No pain Faces Pain Scale: No hurt Pain Intervention(s): Limited activity within patient's tolerance;Monitored during session    Home Living                      Prior Function  PT Goals (current goals can now be found in the care plan section) Acute Rehab PT Goals Patient Stated Goal: to be warm  PT Goal Formulation: With patient Progress towards PT goals: Progressing toward goals    Frequency    Min 3X/week      PT Plan Current plan remains appropriate;Equipment recommendations need to be updated    Co-evaluation PT/OT/SLP Co-Evaluation/Treatment: Yes Reason for Co-Treatment: For patient/therapist  safety;Necessary to address cognition/behavior during functional activity PT goals addressed during session: Mobility/safety with mobility        AM-PAC PT "6 Clicks" Daily Activity  Outcome Measure  Difficulty turning over in bed (including adjusting bedclothes, sheets and blankets)?: None Difficulty moving from lying on back to sitting on the side of the bed? : None Difficulty sitting down on and standing up from a chair with arms (e.g., wheelchair, bedside commode, etc,.)?: None Help needed moving to and from a bed to chair (including a wheelchair)?: A Little Help needed walking in hospital room?: A Little Help needed climbing 3-5 steps with a railing? : A Little 6 Click Score: 21    End of Session Equipment Utilized During Treatment: Gait belt Activity Tolerance: Patient tolerated treatment well Patient left: in chair;with call bell/phone within reach;with chair alarm set;Other (comment)(law enforcement present )   PT Visit Diagnosis: Unsteadiness on feet (R26.81);Muscle weakness (generalized) (M62.81);Difficulty in walking, not elsewhere classified (R26.2)     Time: 1610-9604 PT Time Calculation (min) (ACUTE ONLY): 17 min  Charges:  $Gait Training: 8-22 mins                    G Codes:       Nedra Hai PT, DPT, CBIS  Supplemental Physical Therapist [facility]   Pager [phone]

## 2017-11-18 NOTE — Progress Notes (Signed)
Patient has has poor PO intake and meal intake today.

## 2017-11-18 NOTE — Progress Notes (Signed)
PROGRESS NOTE  Richard Powers RUE:[id number] DOB: 1946-03-28 DOA: 11/16/2017 PCP: [name], MD  HPI/Recap of past 32 hours: 72 year old male with medical history significant for dementia, DM type 2, hypertension, currently incarcerated, presents to the ED with acute renal failure after labs on 3/18 were obtained from jail showed elevated creatinine likely due to poor p.o. Intake.  In the ED patient was noted to be hypotensive, tachycardic, with elevated lactic acid.  Patient was subsequently hydrated, and started on empiric antibiotics.  Patient noted to be a poor historian.  Patient has been in [facility] since October 21, 2017.  Last hospitalization was September 09, 2017 for sepsis and dehydration due to influenza.  Patient admitted for further management  Today, noted with poor oral intake. Denies any chest pain, shortness of breath, abdominal pain, headaches.   Assessment/Plan: Active Problems:   Essential hypertension   DM II (diabetes mellitus, type II), controlled (HCC)   Dementia   Dehydration   AKI (acute kidney injury) (HCC)   Acute encephalopathy   Elevated lactic acid level  ??Sepsis, unlikely Currently afebrile, no leukocytosis, improved BP s/p IVF Hypotensive, tachycardic, elevated LA on admission Unlikely sepsis, likely due to severe dehydration Lactic acid trended down to normal, s/p 2.5L of IVF Pro-calcitonin negative UA negative for infection, urine culture no growth Blood culture no growth till date Chest x-ray negative We will discontinue empiric IV Zosyn plus vancomycin Changed IVF to D5W due to hypernatremia  AKI Improving BUN and creatinine improving status post IVF Likely due to dehydration Renal ultrasound normal Continue IVF Strict I's and O's, avoid nephrotoxins, hold home lisinopril + HCT Daily BMP  Hypernatremia Change IV NS to D5W Daily BMP  Elevated d-dimer D-dimer 7.02 VQ scan negative for PE  Type 2 diabetes  mellitus A1c 9.6 Sensitive SSI, Accu-Cheks Held home metformin, saxagliptin, insulin regular  Hypertension Stable Held lisinopril + HCT due to AKI + hypotension on admission May start norvasc if BP uncontrolled  Dementia Continue aricept    Code Status: Full  Family Communication: None at bedside  Disposition Plan: Back to jail once creatinine back to baseline   Consultants:  None  Procedures:  None  Antimicrobials:  S/P IV vancomycin plus Zosyn  DVT prophylaxis: Lovenox  Objective: Vitals:   11/18/17 0904 11/18/17 1200 11/18/17 1333 11/18/17 2028  BP:  (!) 150/79 (!) 145/82 (!) 147/71  Pulse:  76 82 60  Resp:  14 12 18   Temp: 98.2 F (36.8 C) 98.2 F (36.8 C) 98.5 F (36.9 C) 97.8 F (36.6 C)  TempSrc: Oral     SpO2:  97% 100% 100%  Weight:        Intake/Output Summary (Last 24 hours) at 11/18/2017 2158 Last data filed at 11/18/2017 1800 Gross per 24 hour  Intake 720 ml  Output 975 ml  Net -255 ml   Filed Weights   11/16/17 1401  Weight: 68.9 kg (152 lb)    Exam:   General: NAD  Cardiovascular: S1, S2 present  Respiratory: CTAB  Abdomen: Soft, nontender, nondistended, bowel sounds present  Musculoskeletal: No pedal edema bilaterally  Skin: Normal  Psychiatry: Unable to assess   Data Reviewed: CBC: Recent Labs  Lab 11/16/17 1356 11/17/17 0133 11/18/17 0534  WBC 6.3 8.6 6.0  NEUTROABS 4.0  --  4.4  HGB 15.6 12.7* 13.3  HCT 46.1 38.8* 42.5  MCV 90.4 90.9 92.6  PLT 218 152 139*   Basic Metabolic Panel: Recent Labs  Lab  11/16/17 1356 11/17/17 0133 11/17/17 0819 11/18/17 0534  NA 144 149*  --  153*  K 4.6 3.3*  --  4.0  CL 105 116*  --  117*  CO2 16* 19*  --  20*  GLUCOSE 224* 175*  --  103*  BUN 101* 86*  --  62*  CREATININE 3.44* 3.21*  --  2.76*  CALCIUM 10.0 8.2*  --  9.0  MG  --   --  2.5*  --   PHOS  --   --  4.7*  --    GFR: Estimated Creatinine Clearance: 23 mL/min (A) (by C-G formula based on SCr of  2.76 mg/dL (H)). Liver Function Tests: Recent Labs  Lab 11/16/17 1356 11/17/17 0133  AST 29 22  ALT 14* 15*  ALKPHOS 42 31*  BILITOT 1.5* 1.9*  PROT 8.0 6.2*  ALBUMIN 4.2 3.2*   Recent Labs  Lab 11/16/17 1621  LIPASE 79*   No results for input(s): AMMONIA in the last 168 hours. Coagulation Profile: Recent Labs  Lab 11/16/17 2205  INR 1.17   Cardiac Enzymes: Recent Labs  Lab 11/16/17 2205 11/17/17 0133 11/17/17 0819  CKTOTAL 59  --   --   TROPONINI <0.03 <0.03 <0.03   BNP (last 3 results) No results for input(s): PROBNP in the last 8760 hours. HbA1C: Recent Labs    11/17/17 0133  HGBA1C 9.6*   CBG: Recent Labs  Lab 11/18/17 0411 11/18/17 0811 11/18/17 1240 11/18/17 1605 11/18/17 2025  GLUCAP 104* 85 94 132* 151*   Lipid Profile: Recent Labs    11/18/17 0534  CHOL 136  HDL 50  LDLCALC 62  TRIG 121  CHOLHDL 2.7   Thyroid Function Tests: Recent Labs    11/17/17 0133  TSH 0.969   Anemia Panel: No results for input(s): VITAMINB12, FOLATE, FERRITIN, TIBC, IRON, RETICCTPCT in the last 72 hours. Urine analysis:    Component Value Date/Time   COLORURINE YELLOW 11/16/2017 1630   APPEARANCEUR CLEAR 11/16/2017 1630   LABSPEC 1.021 11/16/2017 1630   PHURINE 5.0 11/16/2017 1630   GLUCOSEU NEGATIVE 11/16/2017 1630   HGBUR NEGATIVE 11/16/2017 1630   BILIRUBINUR NEGATIVE 11/16/2017 1630   KETONESUR 20 (A) 11/16/2017 1630   PROTEINUR NEGATIVE 11/16/2017 1630   UROBILINOGEN 0.2 02/22/2015 2331   NITRITE NEGATIVE 11/16/2017 1630   LEUKOCYTESUR NEGATIVE 11/16/2017 1630   Sepsis Labs: @LABRCNTIP (procalcitonin:4,lacticidven:4)  ) Recent Results (from the past 240 hour(s))  Blood Culture (routine x 2)     Status: None (Preliminary result)   Collection Time: 11/16/17  1:56 PM  Result Value Ref Range Status   Specimen Description BLOOD LEFT ANTECUBITAL  Final   Special Requests   Final    BOTTLES DRAWN AEROBIC ONLY Blood Culture results may not be  optimal due to an inadequate volume of blood received in culture bottles   Culture   Final    NO GROWTH 2 DAYS Performed at [facility], [address], [place], [place] [address]    Report Status PENDING  Incomplete  Urine culture     Status: None   Collection Time: 11/16/17  4:30 PM  Result Value Ref Range Status   Specimen Description URINE, RANDOM  Final   Special Requests NONE  Final   Culture   Final    NO GROWTH Performed at [facility], [address] [place], [place] [address]    Report Status 11/17/2017 FINAL  Final  Culture, blood (Routine X 2)  w Reflex to ID Panel     Status: None (Preliminary result)   Collection Time: 11/16/17 10:00 PM  Result Value Ref Range Status   Specimen Description BLOOD RIGHT HAND  Final   Special Requests IN PEDIATRIC BOTTLE Blood Culture adequate volume  Final   Culture   Final    NO GROWTH 2 DAYS Performed at [facility], [address] [place], [place] [address]    Report Status PENDING  Incomplete  MRSA PCR Screening     Status: None   Collection Time: 11/17/17 12:48 AM  Result Value Ref Range Status   MRSA by PCR NEGATIVE NEGATIVE Final    Comment:        The GeneXpert MRSA Assay (FDA approved for NASAL specimens only), is one component of a comprehensive MRSA colonization surveillance program. It is not intended to diagnose MRSA infection nor to guide or monitor treatment for MRSA infections. Performed at [facility], [address] [place], [place] [address]       Studies: No results found.  Scheduled Meds: . donepezil  10 mg Oral QHS  . enoxaparin (LOVENOX) injection  30 mg Subcutaneous Daily  . guaiFENesin  600 mg Oral BID  . insulin aspart  0-9 Units Subcutaneous Q4H  . ketotifen  1 drop Both Eyes BID  . simvastatin  40 mg Oral Daily  . [START ON 11/19/2017] thiamine  100 mg Oral Daily    Continuous Infusions: . dextrose 100 mL/hr at 11/18/17 1200     LOS: 2 days      Briant CedarNkeiruka J Victoriana Aziz, MD [facility]   If 7PM-7AM, please contact night-coverage [email or web address] Password [email or web address] 11/18/2017, 9:58 PM

## 2017-11-19 LAB — CBC WITH DIFFERENTIAL/PLATELET
BASOS ABS: 0 10*3/uL (ref 0.0–0.1)
Basophils Relative: 0 %
EOS ABS: 0.2 10*3/uL (ref 0.0–0.7)
EOS PCT: 3 %
HCT: 33.3 % — ABNORMAL LOW (ref 39.0–52.0)
Hemoglobin: 10.8 g/dL — ABNORMAL LOW (ref 13.0–17.0)
LYMPHS ABS: 1.8 10*3/uL (ref 0.7–4.0)
LYMPHS PCT: 30 %
MCH: 29.8 pg (ref 26.0–34.0)
MCHC: 32.4 g/dL (ref 30.0–36.0)
MCV: 92 fL (ref 78.0–100.0)
MONO ABS: 0.4 10*3/uL (ref 0.1–1.0)
Monocytes Relative: 6 %
Neutro Abs: 3.7 10*3/uL (ref 1.7–7.7)
Neutrophils Relative %: 61 %
PLATELETS: 123 10*3/uL — AB (ref 150–400)
RBC: 3.62 MIL/uL — ABNORMAL LOW (ref 4.22–5.81)
RDW: 13.9 % (ref 11.5–15.5)
WBC: 6 10*3/uL (ref 4.0–10.5)

## 2017-11-19 LAB — BASIC METABOLIC PANEL
Anion gap: 13 (ref 5–15)
BUN: 41 mg/dL — AB (ref 6–20)
CALCIUM: 8.3 mg/dL — AB (ref 8.9–10.3)
CO2: 21 mmol/L — ABNORMAL LOW (ref 22–32)
Chloride: 114 mmol/L — ABNORMAL HIGH (ref 101–111)
Creatinine, Ser: 2.02 mg/dL — ABNORMAL HIGH (ref 0.61–1.24)
GFR calc Af Amer: 36 mL/min — ABNORMAL LOW (ref >60)
GFR, EST NON AFRICAN AMERICAN: 31 mL/min — AB (ref >60)
GLUCOSE: 152 mg/dL — AB (ref 65–99)
Potassium: 3.6 mmol/L (ref 3.5–5.1)
Sodium: 148 mmol/L — ABNORMAL HIGH (ref 135–145)

## 2017-11-19 LAB — GLUCOSE, CAPILLARY
Glucose-Capillary: 125 mg/dL — ABNORMAL HIGH (ref 65–99)
Glucose-Capillary: 131 mg/dL — ABNORMAL HIGH (ref 65–99)
Glucose-Capillary: 154 mg/dL — ABNORMAL HIGH (ref 65–99)
Glucose-Capillary: 155 mg/dL — ABNORMAL HIGH (ref 65–99)
Glucose-Capillary: 160 mg/dL — ABNORMAL HIGH (ref 65–99)
Glucose-Capillary: 179 mg/dL — ABNORMAL HIGH (ref 65–99)

## 2017-11-19 MED ORDER — ENSURE ENLIVE PO LIQD
237.0000 mL | Freq: Two times a day (BID) | ORAL | Status: DC
  Administered 2017-11-19 – 2017-11-25 (×12): 237 mL via ORAL

## 2017-11-19 NOTE — Progress Notes (Signed)
PROGRESS NOTE  Michele RockersWilliam Thomas AmaXXXBrown III RUE:[id number] DOB: 1946-01-28 DOA: 11/16/2017 PCP: [name], [name], MD  HPI/Recap of past 6524 hours: 72 year old male with medical history significant for dementia, DM type 2, hypertension, currently incarcerated, presents to the ED with acute renal failure after labs on 3/18 were obtained from jail showed elevated creatinine likely due to poor p.o. Intake.  In the ED patient was noted to be hypotensive, tachycardic, with elevated lactic acid.  Patient was subsequently hydrated, and started on empiric antibiotics.  Patient noted to be a poor historian.  Patient has been in [place] [facility] since October 21, 2017.  Last hospitalization was September 09, 2017 for sepsis and dehydration due to influenza.  Patient admitted for further management  Today, noted with poor oral intake, alert, refuses to be interactive. Denies any chest pain, shortness of breath, abdominal pain, headaches.   Assessment/Plan: Active Problems:   Essential hypertension   DM II (diabetes mellitus, type II), controlled (HCC)   Dementia   Dehydration   AKI (acute kidney injury) (HCC)   Acute encephalopathy   Elevated lactic acid level  Failure to thrive in the setting of dementia Refusing to eat food/drink, generalized weakness Currently afebrile, no leukocytosis, improved BP s/p IVF Hypotensive, tachycardic, elevated LA on admission Unlikely sepsis, likely due to severe dehydration Lactic acid trended down to normal, s/p 2.5L of IVF Pro-calcitonin negative UA negative for infection, urine culture no growth Blood culture no growth till date Chest x-ray negative We will discontinue empiric IV Zosyn plus vancomycin Changed IVF to D5W due to hypernatremia Dietitian consult placed, advised RN to give patient Ensure 3 times daily  AKI Improving BUN and creatinine improving status post IVF Likely due to dehydration Renal ultrasound normal Continue IVF Strict I's and O's,  avoid nephrotoxins, hold home lisinopril + HCT Daily BMP  Hypernatremia Improving Change IV NS to D5W Daily BMP  Elevated d-dimer D-dimer 7.02 VQ scan negative for PE  Type 2 diabetes mellitus A1c 9.6 Sensitive SSI, Accu-Cheks Held home metformin, saxagliptin, insulin regular  Hypertension Stable Held lisinopril + HCT due to AKI + hypotension on admission May start norvasc if BP uncontrolled  Dementia Continue aricept    Code Status: Full  Family Communication: None at bedside  Disposition Plan: Back to jail once creatinine back to baseline   Consultants:  None  Procedures:  None  Antimicrobials:  S/P IV vancomycin plus Zosyn  DVT prophylaxis: Lovenox  Objective: Vitals:   11/19/17 0031 11/19/17 0426 11/19/17 0738 11/19/17 1143  BP: (!) 150/72 140/71 137/70 (!) 142/70  Pulse: 65 62 62 63  Resp: 18 18 19 16   Temp: 98.3 F (36.8 C) 98.2 F (36.8 C) 98.4 F (36.9 C) 98.2 F (36.8 C)  TempSrc: Oral Oral Oral Oral  SpO2: 100% 97% 99% 100%  Weight:        Intake/Output Summary (Last 24 hours) at 11/19/2017 1554 Last data filed at 11/19/2017 0545 Gross per 24 hour  Intake 1843.75 ml  Output 400 ml  Net 1443.75 ml   Filed Weights   11/16/17 1401  Weight: 68.9 kg (152 lb)    Exam:   General: NAD, deconditioned overall  Cardiovascular: S1, S2 present  Respiratory: CTAB  Abdomen: Soft, nontender, nondistended, bowel sounds present  Musculoskeletal: No pedal edema bilaterally  Skin: Normal  Psychiatry: Unable to assess   Data Reviewed: CBC: Recent Labs  Lab 11/16/17 1356 11/17/17 0133 11/18/17 0534 11/19/17 0552  WBC 6.3 8.6 6.0 6.0  NEUTROABS  4.0  --  4.4 3.7  HGB 15.6 12.7* 13.3 10.8*  HCT 46.1 38.8* 42.5 33.3*  MCV 90.4 90.9 92.6 92.0  PLT 218 152 139* 123*   Basic Metabolic Panel: Recent Labs  Lab 11/16/17 1356 11/17/17 0133 11/17/17 0819 11/18/17 0534 11/19/17 0552  NA 144 149*  --  153* 148*  K 4.6 3.3*  --   4.0 3.6  CL 105 116*  --  117* 114*  CO2 16* 19*  --  20* 21*  GLUCOSE 224* 175*  --  103* 152*  BUN 101* 86*  --  62* 41*  CREATININE 3.44* 3.21*  --  2.76* 2.02*  CALCIUM 10.0 8.2*  --  9.0 8.3*  MG  --   --  2.5*  --   --   PHOS  --   --  4.7*  --   --    GFR: Estimated Creatinine Clearance: 31.4 mL/min (A) (by C-G formula based on SCr of 2.02 mg/dL (H)). Liver Function Tests: Recent Labs  Lab 11/16/17 1356 11/17/17 0133  AST 29 22  ALT 14* 15*  ALKPHOS 42 31*  BILITOT 1.5* 1.9*  PROT 8.0 6.2*  ALBUMIN 4.2 3.2*   Recent Labs  Lab 11/16/17 1621  LIPASE 79*   No results for input(s): AMMONIA in the last 168 hours. Coagulation Profile: Recent Labs  Lab 11/16/17 2205  INR 1.17   Cardiac Enzymes: Recent Labs  Lab 11/16/17 2205 11/17/17 0133 11/17/17 0819  CKTOTAL 59  --   --   TROPONINI <0.03 <0.03 <0.03   BNP (last 3 results) No results for input(s): PROBNP in the last 8760 hours. HbA1C: Recent Labs    11/17/17 0133  HGBA1C 9.6*   CBG: Recent Labs  Lab 11/18/17 2025 11/19/17 0028 11/19/17 0424 11/19/17 0735 11/19/17 1141  GLUCAP 151* 154* 131* 155* 125*   Lipid Profile: Recent Labs    11/18/17 0534  CHOL 136  HDL 50  LDLCALC 62  TRIG 121  CHOLHDL 2.7   Thyroid Function Tests: Recent Labs    11/17/17 0133  TSH 0.969   Anemia Panel: No results for input(s): VITAMINB12, FOLATE, FERRITIN, TIBC, IRON, RETICCTPCT in the last 72 hours. Urine analysis:    Component Value Date/Time   COLORURINE YELLOW 11/16/2017 1630   APPEARANCEUR CLEAR 11/16/2017 1630   LABSPEC 1.021 11/16/2017 1630   PHURINE 5.0 11/16/2017 1630   GLUCOSEU NEGATIVE 11/16/2017 1630   HGBUR NEGATIVE 11/16/2017 1630   BILIRUBINUR NEGATIVE 11/16/2017 1630   KETONESUR 20 (A) 11/16/2017 1630   PROTEINUR NEGATIVE 11/16/2017 1630   UROBILINOGEN 0.2 02/22/2015 2331   NITRITE NEGATIVE 11/16/2017 1630   LEUKOCYTESUR NEGATIVE 11/16/2017 1630   Sepsis  Labs: @LABRCNTIP (procalcitonin:4,lacticidven:4)  ) Recent Results (from the past 240 hour(s))  Blood Culture (routine x 2)     Status: None (Preliminary result)   Collection Time: 11/16/17  1:56 PM  Result Value Ref Range Status   Specimen Description BLOOD LEFT ANTECUBITAL  Final   Special Requests   Final    BOTTLES DRAWN AEROBIC ONLY Blood Culture results may not be optimal due to an inadequate volume of blood received in culture bottles   Culture   Final    NO GROWTH 3 DAYS Performed at [facility], [address] [place], [place] [address]    Report Status PENDING  Incomplete  Urine culture     Status: None   Collection Time: 11/16/17  4:30 PM  Result Value Ref Range  Status   Specimen Description URINE, RANDOM  Final   Special Requests NONE  Final   Culture   Final    NO GROWTH Performed at [facility], [address], [place], [place] [address]    Report Status 11/17/2017 FINAL  Final  Culture, blood (Routine X 2) w Reflex to ID Panel     Status: None (Preliminary result)   Collection Time: 11/16/17 10:00 PM  Result Value Ref Range Status   Specimen Description BLOOD RIGHT HAND  Final   Special Requests IN PEDIATRIC BOTTLE Blood Culture adequate volume  Final   Culture   Final    NO GROWTH 3 DAYS Performed at [facility], [address], [place], [place] [address]    Report Status PENDING  Incomplete  MRSA PCR Screening     Status: None   Collection Time: 11/17/17 12:48 AM  Result Value Ref Range Status   MRSA by PCR NEGATIVE NEGATIVE Final    Comment:        The GeneXpert MRSA Assay (FDA approved for NASAL specimens only), is one component of a comprehensive MRSA colonization surveillance program. It is not intended to diagnose MRSA infection nor to guide or monitor treatment for MRSA infections. Performed at [facility], [address] [place], [place] [address]       Studies: No results found.  Scheduled Meds: .  donepezil  10 mg Oral QHS  . enoxaparin (LOVENOX) injection  30 mg Subcutaneous Daily  . feeding supplement (ENSURE ENLIVE)  237 mL Oral BID BM  . guaiFENesin  600 mg Oral BID  . insulin aspart  0-9 Units Subcutaneous Q4H  . ketotifen  1 drop Both Eyes BID  . simvastatin  40 mg Oral Daily  . thiamine  100 mg Oral Daily    Continuous Infusions: . dextrose 75 mL/hr at 11/19/17 0257     LOS: 3 days     Briant Cedar, MD [facility]   If 7PM-7AM, please contact night-coverage [email or web address] Password [facility] 11/19/2017, 3:54 PM

## 2017-11-19 NOTE — Plan of Care (Signed)
Progressing

## 2017-11-20 DIAGNOSIS — E119 Type 2 diabetes mellitus without complications: Secondary | ICD-10-CM

## 2017-11-20 DIAGNOSIS — Z87891 Personal history of nicotine dependence: Secondary | ICD-10-CM

## 2017-11-20 LAB — CBC WITH DIFFERENTIAL/PLATELET
BASOS PCT: 0 %
Basophils Absolute: 0 10*3/uL (ref 0.0–0.1)
Eosinophils Absolute: 0.3 10*3/uL (ref 0.0–0.7)
Eosinophils Relative: 5 %
HEMATOCRIT: 32 % — AB (ref 39.0–52.0)
HEMOGLOBIN: 10.4 g/dL — AB (ref 13.0–17.0)
LYMPHS ABS: 1.5 10*3/uL (ref 0.7–4.0)
LYMPHS PCT: 27 %
MCH: 29 pg (ref 26.0–34.0)
MCHC: 32.5 g/dL (ref 30.0–36.0)
MCV: 89.1 fL (ref 78.0–100.0)
MONOS PCT: 8 %
Monocytes Absolute: 0.4 10*3/uL (ref 0.1–1.0)
NEUTROS ABS: 3.3 10*3/uL (ref 1.7–7.7)
NEUTROS PCT: 60 %
Platelets: 118 10*3/uL — ABNORMAL LOW (ref 150–400)
RBC: 3.59 MIL/uL — ABNORMAL LOW (ref 4.22–5.81)
RDW: 13.2 % (ref 11.5–15.5)
WBC: 5.5 10*3/uL (ref 4.0–10.5)

## 2017-11-20 LAB — GLUCOSE, CAPILLARY
GLUCOSE-CAPILLARY: 119 mg/dL — AB (ref 65–99)
GLUCOSE-CAPILLARY: 150 mg/dL — AB (ref 65–99)
GLUCOSE-CAPILLARY: 156 mg/dL — AB (ref 65–99)
GLUCOSE-CAPILLARY: 162 mg/dL — AB (ref 65–99)
GLUCOSE-CAPILLARY: 171 mg/dL — AB (ref 65–99)
Glucose-Capillary: 162 mg/dL — ABNORMAL HIGH (ref 65–99)

## 2017-11-20 LAB — BASIC METABOLIC PANEL
Anion gap: 7 (ref 5–15)
BUN: 24 mg/dL — ABNORMAL HIGH (ref 6–20)
CHLORIDE: 114 mmol/L — AB (ref 101–111)
CO2: 24 mmol/L (ref 22–32)
CREATININE: 1.66 mg/dL — AB (ref 0.61–1.24)
Calcium: 8.4 mg/dL — ABNORMAL LOW (ref 8.9–10.3)
GFR calc non Af Amer: 40 mL/min — ABNORMAL LOW (ref >60)
GFR, EST AFRICAN AMERICAN: 46 mL/min — AB (ref >60)
Glucose, Bld: 170 mg/dL — ABNORMAL HIGH (ref 65–99)
Potassium: 3.1 mmol/L — ABNORMAL LOW (ref 3.5–5.1)
Sodium: 145 mmol/L (ref 135–145)

## 2017-11-20 MED ORDER — POTASSIUM CHLORIDE 10 MEQ/100ML IV SOLN
10.0000 meq | INTRAVENOUS | Status: AC
  Administered 2017-11-20 (×2): 10 meq via INTRAVENOUS
  Filled 2017-11-20 (×2): qty 100

## 2017-11-20 MED ORDER — KCL IN DEXTROSE-NACL 40-5-0.45 MEQ/L-%-% IV SOLN
INTRAVENOUS | Status: DC
  Administered 2017-11-20 – 2017-11-21 (×3): via INTRAVENOUS
  Filled 2017-11-20 (×5): qty 1000

## 2017-11-20 NOTE — Progress Notes (Signed)
PROGRESS NOTE  Richard Powers ZOX:[id number] DOB: 1946/02/25 DOA: 11/16/2017 PCP: [name], MD  HPI/Recap of past 57 hours: 72 year old male with medical history significant for dementia, DM type 2, hypertension, currently incarcerated, presents to the ED with acute renal failure after labs on 3/18 were obtained from jail showed elevated creatinine likely due to poor p.o. Intake.  In the ED patient was noted to be hypotensive, tachycardic, with elevated lactic acid.  Patient was subsequently hydrated, and started on empiric antibiotics.  Patient noted to be a poor historian.  Patient has been in [place] jail since October 21, 2017.  Last hospitalization was September 09, 2017 for sepsis and dehydration due to influenza.  Patient admitted for further management  Today, noted with poor oral intake, alert, refuses to be interactive. Denies any chest pain, shortness of breath, abdominal pain, headaches.  11/20/2017-patient seen.  Available records reviewed.  Patient cannot give any significant history.  Patient has at least moderate dementing illness.  Patient lacks capacity.  Will consult psychiatry for Mini-Mental status examination and capacity assessment.  Meanwhile, the patient's acute kidney injury is improving.  The improvement we see may be totally artificial, as the patient continues to have a poor p.o. intake.  Long-term care for this patient should be discussed extensively prior to discharge.  I doubt if the patient will survive in jail, considering the extent of patient's condition and prognosis.  Assessment/Plan: Active Problems:   Essential hypertension   DM II (diabetes mellitus, type II), controlled (HCC)   Dementia   Dehydration   AKI (acute kidney injury) (HCC)   Acute encephalopathy   Elevated lactic acid level  Failure to thrive in the setting of dementia Refusing to eat food/drink, generalized weakness Currently afebrile, no leukocytosis, improved BP  s/p IVF Hypotensive, tachycardic, elevated LA on admission Unlikely sepsis, likely due to severe dehydration Lactic acid trended down to normal, s/p 2.5L of IVF Pro-calcitonin negative UA negative for infection, urine culture no growth Blood culture no growth till date Chest x-ray negative We will discontinue empiric IV Zosyn plus vancomycin Changed IVF to D5W due to hypernatremia Dietitian consult placed, advised RN to give patient Ensure 3 times daily  AKI Improving BUN and creatinine improving status post IVF Likely due to dehydration Renal ultrasound normal Continue IVF Strict I's and O's, avoid nephrotoxins, hold home lisinopril + HCT Daily BMP  Hypernatremia Improving Change IV NS to D5W Daily BMP  Elevated d-dimer D-dimer 7.02 VQ scan negative for PE  Type 2 diabetes mellitus A1c 9.6 Sensitive SSI, Accu-Cheks Held home metformin, saxagliptin, insulin regular  Hypertension Stable Held lisinopril + HCT due to AKI + hypotension on admission May start norvasc if BP uncontrolled  Dementia Continue aricept    Code Status: Full  Family Communication: None at bedside  Disposition Plan: Back to jail once creatinine back to baseline   Consultants:  None  Procedures:  None  Antimicrobials:  S/P IV vancomycin plus Zosyn  DVT prophylaxis: Lovenox  Objective: Vitals:   11/19/17 1653 11/19/17 2036 11/20/17 0015 11/20/17 0420  BP: (!) 161/99 (!) 154/64 (!) 150/65 123/60  Pulse: (!) 55 (!) 58 (!) 56 69  Resp: 18 18 18 18   Temp: 98.3 F (36.8 C) 98.3 F (36.8 C) 98.7 F (37.1 C) 98.6 F (37 C)  TempSrc: Oral Oral Oral Oral  SpO2: 100% 100% 100% 99%  Weight:        Intake/Output Summary (Last 24 hours) at 11/20/2017 8119 Last data  filed at 11/20/2017 0552 Gross per 24 hour  Intake 1808.75 ml  Output 800 ml  Net 1008.75 ml   Filed Weights   11/16/17 1401  Weight: 68.9 kg (152 lb)    Exam:   General: Lethargic.  Not able to give any  significant history.    Cardiovascular: S1, S2 present  Respiratory: CTAB  Abdomen: Soft, nontender, nondistended, bowel sounds present  Musculoskeletal: No pedal edema bilaterally  Skin: Normal     Data Reviewed: CBC: Recent Labs  Lab 11/16/17 1356 11/17/17 0133 11/18/17 0534 11/19/17 0552 11/20/17 0624  WBC 6.3 8.6 6.0 6.0 5.5  NEUTROABS 4.0  --  4.4 3.7 3.3  HGB 15.6 12.7* 13.3 10.8* 10.4*  HCT 46.1 38.8* 42.5 33.3* 32.0*  MCV 90.4 90.9 92.6 92.0 89.1  PLT 218 152 139* 123* 118*   Basic Metabolic Panel: Recent Labs  Lab 11/16/17 1356 11/17/17 0133 11/17/17 0819 11/18/17 0534 11/19/17 0552 11/20/17 0624  NA 144 149*  --  153* 148* 145  K 4.6 3.3*  --  4.0 3.6 3.1*  CL 105 116*  --  117* 114* 114*  CO2 16* 19*  --  20* 21* 24  GLUCOSE 224* 175*  --  103* 152* 170*  BUN 101* 86*  --  62* 41* 24*  CREATININE 3.44* 3.21*  --  2.76* 2.02* 1.66*  CALCIUM 10.0 8.2*  --  9.0 8.3* 8.4*  MG  --   --  2.5*  --   --   --   PHOS  --   --  4.7*  --   --   --    GFR: Estimated Creatinine Clearance: 38.2 mL/min (A) (by C-G formula based on SCr of 1.66 mg/dL (H)). Liver Function Tests: Recent Labs  Lab 11/16/17 1356 11/17/17 0133  AST 29 22  ALT 14* 15*  ALKPHOS 42 31*  BILITOT 1.5* 1.9*  PROT 8.0 6.2*  ALBUMIN 4.2 3.2*   Recent Labs  Lab 11/16/17 1621  LIPASE 79*   No results for input(s): AMMONIA in the last 168 hours. Coagulation Profile: Recent Labs  Lab 11/16/17 2205  INR 1.17   Cardiac Enzymes: Recent Labs  Lab 11/16/17 2205 11/17/17 0133 11/17/17 0819  CKTOTAL 59  --   --   TROPONINI <0.03 <0.03 <0.03   BNP (last 3 results) No results for input(s): PROBNP in the last 8760 hours. HbA1C: No results for input(s): HGBA1C in the last 72 hours. CBG: Recent Labs  Lab 11/19/17 1649 11/19/17 2034 11/20/17 0014 11/20/17 0419 11/20/17 0754  GLUCAP 160* 179* 150* 156* 162*   Lipid Profile: Recent Labs    11/18/17 0534  CHOL 136  HDL  50  LDLCALC 62  TRIG 161  CHOLHDL 2.7   Thyroid Function Tests: No results for input(s): TSH, T4TOTAL, FREET4, T3FREE, THYROIDAB in the last 72 hours. Anemia Panel: No results for input(s): VITAMINB12, FOLATE, FERRITIN, TIBC, IRON, RETICCTPCT in the last 72 hours. Urine analysis:    Component Value Date/Time   COLORURINE YELLOW 11/16/2017 1630   APPEARANCEUR CLEAR 11/16/2017 1630   LABSPEC 1.021 11/16/2017 1630   PHURINE 5.0 11/16/2017 1630   GLUCOSEU NEGATIVE 11/16/2017 1630   HGBUR NEGATIVE 11/16/2017 1630   BILIRUBINUR NEGATIVE 11/16/2017 1630   KETONESUR 20 (A) 11/16/2017 1630   PROTEINUR NEGATIVE 11/16/2017 1630   UROBILINOGEN 0.2 02/22/2015 2331   NITRITE NEGATIVE 11/16/2017 1630   LEUKOCYTESUR NEGATIVE 11/16/2017 1630   Sepsis Labs: @LABRCNTIP (procalcitonin:4,lacticidven:4)  ) Recent Results (from the  past 240 hour(s))  Blood Culture (routine x 2)     Status: None (Preliminary result)   Collection Time: 11/16/17  1:56 PM  Result Value Ref Range Status   Specimen Description BLOOD LEFT ANTECUBITAL  Final   Special Requests   Final    BOTTLES DRAWN AEROBIC ONLY Blood Culture results may not be optimal due to an inadequate volume of blood received in culture bottles   Culture   Final    NO GROWTH 3 DAYS Performed at [facility], [address] [place], [place] [address]    Report Status PENDING  Incomplete  Urine culture     Status: None   Collection Time: 11/16/17  4:30 PM  Result Value Ref Range Status   Specimen Description URINE, RANDOM  Final   Special Requests NONE  Final   Culture   Final    NO GROWTH Performed at [facility], [address] [place], [place] [address]    Report Status 11/17/2017 FINAL  Final  Culture, blood (Routine X 2) w Reflex to ID Panel     Status: None (Preliminary result)   Collection Time: 11/16/17 10:00 PM  Result Value Ref Range Status   Specimen Description BLOOD RIGHT HAND  Final   Special Requests IN  PEDIATRIC BOTTLE Blood Culture adequate volume  Final   Culture   Final    NO GROWTH 3 DAYS Performed at [facility], [address] [place], [place] [address]    Report Status PENDING  Incomplete  MRSA PCR Screening     Status: None   Collection Time: 11/17/17 12:48 AM  Result Value Ref Range Status   MRSA by PCR NEGATIVE NEGATIVE Final    Comment:        The GeneXpert MRSA Assay (FDA approved for NASAL specimens only), is one component of a comprehensive MRSA colonization surveillance program. It is not intended to diagnose MRSA infection nor to guide or monitor treatment for MRSA infections. Performed at [facility], [address] [place], [place] [address]       Studies: No results found.  Scheduled Meds: . donepezil  10 mg Oral QHS  . enoxaparin (LOVENOX) injection  30 mg Subcutaneous Daily  . feeding supplement (ENSURE ENLIVE)  237 mL Oral BID BM  . guaiFENesin  600 mg Oral BID  . insulin aspart  0-9 Units Subcutaneous Q4H  . ketotifen  1 drop Both Eyes BID  . simvastatin  40 mg Oral Daily  . thiamine  100 mg Oral Daily    Continuous Infusions: . dextrose 5 % and 0.45 % NaCl with KCl 40 mEq/L    . potassium chloride       LOS: 4 days     Barnetta ChapelSylvester I Sumer [place], MD [facility]   If 7PM-7AM, please contact night-coverage [email or web address] Password [email or web address] 11/20/2017, 9:06 AM

## 2017-11-20 NOTE — Consult Note (Signed)
[facility] Face-to-Face Psychiatry Consult   Reason for Consult:  Capacity? Referring Physician:  Dr. Marthenia Rolling Patient Identification: Richard Powers MRN:  [id number] Principal Diagnosis: Dementia Diagnosis:   Patient Active Problem List   Diagnosis Date Noted  . Dehydration [E86.0] 11/16/2017  . AKI (acute kidney injury) ([place]) [N17.9] 11/16/2017  . Acute encephalopathy [G93.40] 11/16/2017  . Elevated lactic acid level [R79.89] 11/16/2017  . Glaucoma [H40.9] 10/17/2017  . Dementia [F03.90] 09/15/2017  . Sepsis ([place]) [A41.9] 09/09/2017  . Essential hypertension [I10] 09/09/2017  . DM II (diabetes mellitus, type II), controlled ([place]) [E11.9] 09/09/2017    Total Time spent with patient: 45 minutes  Subjective:   Richard Powers is a 72 y.o. male patient admitted with dehydration.Marland Kitchen  HPI:  Patient with medical history significant for dementia, DM type 2, hypertension, currently incarcerated in [place] since Oct 21, 2017 for trespassing and failure to appear in court. Patient is a poor historian who was not able to verbalize the detail of his incarceration or the circumstances that lead to his hospital admission. Patient does not understand while he needs hospitalization and has been refusing to eat properly since admission. He says he is no longer able to care for himself but does not appreciate the needs for him to ask for help. Patient denies psychosis, delusions, SI/HI or depressive symptoms. Patient scored 15/30 on Mini mental status examination. He is unable to reports if makes his medical decisions or has a guardian.  Past Psychiatric History:   Risk to Self: Is patient at risk for suicide?: No Risk to Others:   Prior Inpatient Therapy:   Prior Outpatient Therapy:    Past Medical History:  Past Medical History:  Diagnosis Date  . Diabetes mellitus without complication ([place])   . Hypertension    History reviewed. No pertinent surgical history. Family  History: History reviewed. No pertinent family history. Family Psychiatric  History:  Social History:  Social History   Substance and Sexual Activity  Alcohol Use Yes     Social History   Substance and Sexual Activity  Drug Use No    Social History   Socioeconomic History  . Marital status: Married    Spouse name: Not on file  . Number of children: Not on file  . Years of education: Not on file  . Highest education level: Not on file  Occupational History  . Not on file  Social Needs  . Financial resource strain: Not on file  . Food insecurity:    Worry: Not on file    Inability: Not on file  . Transportation needs:    Medical: Not on file    Non-medical: Not on file  Tobacco Use  . Smoking status: Former Research scientist (life sciences)  . Smokeless tobacco: Never Used  Substance and Sexual Activity  . Alcohol use: Yes  . Drug use: No  . Sexual activity: Yes  Lifestyle  . Physical activity:    Days per week: Not on file    Minutes per session: Not on file  . Stress: Not on file  Relationships  . Social connections:    Talks on phone: Not on file    Gets together: Not on file    Attends religious service: Not on file    Active member of club or organization: Not on file    Attends meetings of clubs or organizations: Not on file    Relationship status: Not on file  Other Topics Concern  . Not on  file  Social History Narrative  . Not on file   Additional Social History:    Allergies:  No Known Allergies  Labs:  Results for orders placed or performed during the hospital encounter of 11/16/17 (from the past 48 hour(s))  Glucose, capillary     Status: Abnormal   Collection Time: 11/18/17  4:05 PM  Result Value Ref Range   Glucose-Capillary 132 (H) 65 - 99 mg/dL  Glucose, capillary     Status: Abnormal   Collection Time: 11/18/17  8:25 PM  Result Value Ref Range   Glucose-Capillary 151 (H) 65 - 99 mg/dL  Glucose, capillary     Status: Abnormal   Collection Time: 11/19/17 12:28  AM  Result Value Ref Range   Glucose-Capillary 154 (H) 65 - 99 mg/dL  Glucose, capillary     Status: Abnormal   Collection Time: 11/19/17  4:24 AM  Result Value Ref Range   Glucose-Capillary 131 (H) 65 - 99 mg/dL  CBC with Differential/Platelet     Status: Abnormal   Collection Time: 11/19/17  5:52 AM  Result Value Ref Range   WBC 6.0 4.0 - 10.5 K/uL   RBC 3.62 (L) 4.22 - 5.81 MIL/uL   Hemoglobin 10.8 (L) 13.0 - 17.0 g/dL   HCT 33.3 (L) 39.0 - 52.0 %   MCV 92.0 78.0 - 100.0 fL   MCH 29.8 26.0 - 34.0 pg   MCHC 32.4 30.0 - 36.0 g/dL   RDW 13.9 11.5 - 15.5 %   Platelets 123 (L) 150 - 400 K/uL   Neutrophils Relative % 61 %   Neutro Abs 3.7 1.7 - 7.7 K/uL   Lymphocytes Relative 30 %   Lymphs Abs 1.8 0.7 - 4.0 K/uL   Monocytes Relative 6 %   Monocytes Absolute 0.4 0.1 - 1.0 K/uL   Eosinophils Relative 3 %   Eosinophils Absolute 0.2 0.0 - 0.7 K/uL   Basophils Relative 0 %   Basophils Absolute 0.0 0.0 - 0.1 K/uL    Comment: Performed at [facility], [address] [place], [place] [address]  Basic metabolic panel     Status: Abnormal   Collection Time: 11/19/17  5:52 AM  Result Value Ref Range   Sodium 148 (H) 135 - 145 mmol/L   Potassium 3.6 3.5 - 5.1 mmol/L   Chloride 114 (H) 101 - 111 mmol/L   CO2 21 (L) 22 - 32 mmol/L   Glucose, Bld 152 (H) 65 - 99 mg/dL   BUN 41 (H) 6 - 20 mg/dL   Creatinine, Ser 2.02 (H) 0.61 - 1.24 mg/dL   Calcium 8.3 (L) 8.9 - 10.3 mg/dL   GFR calc non Af Amer 31 (L) >60 mL/min   GFR calc Af Amer 36 (L) >60 mL/min    Comment: (NOTE) The eGFR has been calculated using the CKD EPI equation. This calculation has not been validated in all clinical situations. eGFR's persistently <60 mL/min signify possible Chronic Kidney Disease.    Anion gap 13 5 - 15    Comment: Performed at [facility] [address] [place], [place] [address]  Glucose, capillary     Status: Abnormal   Collection Time: 11/19/17  7:35 AM  Result Value Ref Range    Glucose-Capillary 155 (H) 65 - 99 mg/dL  Glucose, capillary     Status: Abnormal   Collection Time: 11/19/17 11:41 AM  Result Value Ref Range   Glucose-Capillary 125 (H) 65 - 99 mg/dL  Glucose,  capillary     Status: Abnormal   Collection Time: 11/19/17  4:49 PM  Result Value Ref Range   Glucose-Capillary 160 (H) 65 - 99 mg/dL  Glucose, capillary     Status: Abnormal   Collection Time: 11/19/17  8:34 PM  Result Value Ref Range   Glucose-Capillary 179 (H) 65 - 99 mg/dL  Glucose, capillary     Status: Abnormal   Collection Time: 11/20/17 12:14 AM  Result Value Ref Range   Glucose-Capillary 150 (H) 65 - 99 mg/dL  Glucose, capillary     Status: Abnormal   Collection Time: 11/20/17  4:19 AM  Result Value Ref Range   Glucose-Capillary 156 (H) 65 - 99 mg/dL  CBC with Differential/Platelet     Status: Abnormal   Collection Time: 11/20/17  6:24 AM  Result Value Ref Range   WBC 5.5 4.0 - 10.5 K/uL   RBC 3.59 (L) 4.22 - 5.81 MIL/uL   Hemoglobin 10.4 (L) 13.0 - 17.0 g/dL   HCT 32.0 (L) 39.0 - 52.0 %   MCV 89.1 78.0 - 100.0 fL   MCH 29.0 26.0 - 34.0 pg   MCHC 32.5 30.0 - 36.0 g/dL   RDW 13.2 11.5 - 15.5 %   Platelets 118 (L) 150 - 400 K/uL    Comment: PLATELET COUNT CONFIRMED BY SMEAR   Neutrophils Relative % 60 %   Neutro Abs 3.3 1.7 - 7.7 K/uL   Lymphocytes Relative 27 %   Lymphs Abs 1.5 0.7 - 4.0 K/uL   Monocytes Relative 8 %   Monocytes Absolute 0.4 0.1 - 1.0 K/uL   Eosinophils Relative 5 %   Eosinophils Absolute 0.3 0.0 - 0.7 K/uL   Basophils Relative 0 %   Basophils Absolute 0.0 0.0 - 0.1 K/uL    Comment: Performed at [facility], [address], [place], [place] [address]  Basic metabolic panel     Status: Abnormal   Collection Time: 11/20/17  6:24 AM  Result Value Ref Range   Sodium 145 135 - 145 mmol/L   Potassium 3.1 (L) 3.5 - 5.1 mmol/L   Chloride 114 (H) 101 - 111 mmol/L   CO2 24 22 - 32 mmol/L   Glucose, Bld 170 (H) 65 - 99 mg/dL   BUN 24 (H) 6 - 20 mg/dL    Creatinine, Ser 1.66 (H) 0.61 - 1.24 mg/dL   Calcium 8.4 (L) 8.9 - 10.3 mg/dL   GFR calc non Af Amer 40 (L) >60 mL/min   GFR calc Af Amer 46 (L) >60 mL/min    Comment: (NOTE) The eGFR has been calculated using the CKD EPI equation. This calculation has not been validated in all clinical situations. eGFR's persistently <60 mL/min signify possible Chronic Kidney Disease.    Anion gap 7 5 - 15    Comment: Performed at [facility] [address], [place], [place] [address]  Glucose, capillary     Status: Abnormal   Collection Time: 11/20/17  7:54 AM  Result Value Ref Range   Glucose-Capillary 162 (H) 65 - 99 mg/dL  Glucose, capillary     Status: Abnormal   Collection Time: 11/20/17 12:10 PM  Result Value Ref Range   Glucose-Capillary 119 (H) 65 - 99 mg/dL    Current Facility-Administered Medications  Medication Dose Route Frequency Provider Last Rate Last Dose  . acetaminophen (TYLENOL) tablet 650 mg  650 mg Oral Q6H PRN Toy Baker, MD   650 mg at 11/18/17 [phone]  Or  . acetaminophen (TYLENOL) suppository 650 mg  650 mg Rectal Q6H PRN Doutova, Anastassia, MD      . albuterol (PROVENTIL) (2.5 MG/3ML) 0.083% nebulizer solution 2.5 mg  2.5 mg Nebulization Q2H PRN Doutova, Anastassia, MD      . dextrose 5 % and 0.45 % NaCl with KCl 40 mEq/L infusion   Intravenous Continuous Dana Allan I, MD 75 mL/hr at 11/20/17 1005    . donepezil (ARICEPT) tablet 10 mg  10 mg Oral QHS Toy Baker, MD   10 mg at 11/19/17 2158  . enoxaparin (LOVENOX) injection 30 mg  30 mg Subcutaneous Daily Doutova, Anastassia, MD   30 mg at 11/20/17 0852  . feeding supplement (ENSURE ENLIVE) (ENSURE ENLIVE) liquid 237 mL  237 mL Oral BID BM Alma Friendly, MD   237 mL at 11/20/17 0854  . guaiFENesin (MUCINEX) 12 hr tablet 600 mg  600 mg Oral BID Toy Baker, MD   600 mg at 11/19/17 2158  . HYDROcodone-acetaminophen (NORCO/VICODIN) 5-325 MG per tablet 1-2 tablet  1-2 tablet Oral  Q4H PRN Doutova, Anastassia, MD      . insulin aspart (novoLOG) injection 0-9 Units  0-9 Units Subcutaneous Q4H Toy Baker, MD   2 Units at 11/20/17 3244  . ketotifen (ZADITOR) 0.025 % ophthalmic solution 1 drop  1 drop Both Eyes BID Toy Baker, MD   1 drop at 11/19/17 2158  . ondansetron (ZOFRAN) tablet 4 mg  4 mg Oral Q6H PRN Toy Baker, MD       Or  . ondansetron (ZOFRAN) injection 4 mg  4 mg Intravenous Q6H PRN Doutova, Anastassia, MD      . polyvinyl alcohol (LIQUIFILM TEARS) 1.4 % ophthalmic solution 1 drop  1 drop Both Eyes PRN Alma Friendly, MD      . simvastatin (ZOCOR) tablet 40 mg  40 mg Oral Daily Doutova, Anastassia, MD   40 mg at 11/19/17 0845  . thiamine (VITAMIN B-1) tablet 100 mg  100 mg Oral Daily Alma Friendly, MD   100 mg at 11/19/17 0845    Musculoskeletal: Strength & Muscle Tone: within normal limits Gait & Station: unable to assess Patient leans: N/A  Psychiatric Specialty Exam: Physical Exam  Psychiatric: Judgment and thought content normal. His affect is blunt. His speech is delayed. He is slowed and withdrawn. Cognition and memory are impaired.    Review of Systems  Constitutional: Positive for malaise/fatigue.  Eyes: Negative.   Respiratory: Negative.   Musculoskeletal: Negative.   Skin: Negative.   Endo/Heme/Allergies: Negative.   Psychiatric/Behavioral: Positive for memory loss.    Blood pressure 123/60, pulse 69, temperature 98.6 F (37 C), temperature source Oral, resp. rate 18, weight 68.9 kg (152 lb), SpO2 99 %.Body mass index is 23.81 kg/m.  General Appearance: Casual  Eye Contact:  Good  Speech:  Clear and Coherent and Slow  Volume:  Decreased  Mood:  Dysphoric  Affect:  Constricted  Thought Process:  Disorganized  Orientation:  Other:  only to place and person  Thought Content:  Illogical  Suicidal Thoughts:  No  Homicidal Thoughts:  No  Memory:  Immediate;   Fair Recent;   Poor Remote;   Poor   Judgement:  Poor  Insight:  Shallow  Psychomotor Activity:  Psychomotor Retardation  Concentration:  Concentration: Fair and Attention Span: Fair  Recall:  Poor  Fund of Knowledge:  unable to assess  Language:  Fair  Akathisia:  No  Handed:  Right  AIMS (if indicated):     Assets:  Resilience  ADL's:  Impaired  Cognition:  Impaired,  Moderate  Sleep:   fair     Treatment Plan Summary: 72 year old man with history of Dementia who was brought to the hospital for treatment of Dehydration. Patient appears to be in state of advanced Dementia and has difficulty organizing his thoughts or articulating his needs as one would expect in an individual with normal cognitive status. Patient scored 15/30 on Mini mental status examination. At this point, patient does not appear to have Capacity to make an informed decision. Furthermore, he seems like he is incapable of taking care of himself.  Plan/Recommendations: -Consider Neuro Consult to fully evaluate patient level of Dementia. -May need to contact patient's family to consider seeking power of attorney or guardianship for patient. -Consider Social worker consult  Disposition: No evidence of imminent risk to self or others at present.   Patient does not meet criteria for psychiatric inpatient admission.  Corena Pilgrim, MD 11/20/2017 3:02 PM

## 2017-11-20 NOTE — Progress Notes (Signed)
Second day patient hasn't had desire or appetite to eat. MD aware.

## 2017-11-20 NOTE — Progress Notes (Addendum)
CSW consulted with AD Zack. CSW unable to seek placement patient due to incarceration status.   CSW signing off.   Budd Palmerara Nioma Mccubbins LCSWA [phone]

## 2017-11-20 NOTE — Plan of Care (Signed)
Progressing

## 2017-11-21 LAB — GLUCOSE, CAPILLARY
GLUCOSE-CAPILLARY: 144 mg/dL — AB (ref 65–99)
GLUCOSE-CAPILLARY: 144 mg/dL — AB (ref 65–99)
GLUCOSE-CAPILLARY: 138 mg/dL — AB (ref 65–99)
GLUCOSE-CAPILLARY: 137 mg/dL — AB (ref 65–99)
GLUCOSE-CAPILLARY: 149 mg/dL — AB (ref 65–99)
GLUCOSE-CAPILLARY: 187 mg/dL — AB (ref 65–99)
Glucose-Capillary: 133 mg/dL — ABNORMAL HIGH (ref 65–99)
Glucose-Capillary: 148 mg/dL — ABNORMAL HIGH (ref 65–99)

## 2017-11-21 LAB — CBC WITH DIFFERENTIAL/PLATELET
BASOS ABS: 0 10*3/uL (ref 0.0–0.1)
Basophils Relative: 0 %
EOS PCT: 4 %
Eosinophils Absolute: 0.2 10*3/uL (ref 0.0–0.7)
HEMATOCRIT: 32.7 % — AB (ref 39.0–52.0)
Hemoglobin: 10.7 g/dL — ABNORMAL LOW (ref 13.0–17.0)
LYMPHS PCT: 36 %
Lymphs Abs: 1.7 10*3/uL (ref 0.7–4.0)
MCH: 29.6 pg (ref 26.0–34.0)
MCHC: 32.7 g/dL (ref 30.0–36.0)
MCV: 90.3 fL (ref 78.0–100.0)
MONO ABS: 0.4 10*3/uL (ref 0.1–1.0)
Monocytes Relative: 8 %
NEUTROS ABS: 2.4 10*3/uL (ref 1.7–7.7)
Neutrophils Relative %: 52 %
PLATELETS: 101 10*3/uL — AB (ref 150–400)
RBC: 3.62 MIL/uL — AB (ref 4.22–5.81)
RDW: 13.5 % (ref 11.5–15.5)
WBC: 4.7 10*3/uL (ref 4.0–10.5)

## 2017-11-21 LAB — CULTURE, BLOOD (ROUTINE X 2)
CULTURE: NO GROWTH
Culture: NO GROWTH
Special Requests: ADEQUATE

## 2017-11-21 LAB — RENAL FUNCTION PANEL
Albumin: 2.6 g/dL — ABNORMAL LOW (ref 3.5–5.0)
Anion gap: 5 (ref 5–15)
BUN: 15 mg/dL (ref 6–20)
CO2: 23 mmol/L (ref 22–32)
Calcium: 8.2 mg/dL — ABNORMAL LOW (ref 8.9–10.3)
Chloride: 114 mmol/L — ABNORMAL HIGH (ref 101–111)
Creatinine, Ser: 1.45 mg/dL — ABNORMAL HIGH (ref 0.61–1.24)
GFR calc Af Amer: 54 mL/min — ABNORMAL LOW (ref >60)
GFR calc non Af Amer: 47 mL/min — ABNORMAL LOW (ref >60)
Glucose, Bld: 132 mg/dL — ABNORMAL HIGH (ref 65–99)
Phosphorus: 1.7 mg/dL — ABNORMAL LOW (ref 2.5–4.6)
Potassium: 3.6 mmol/L (ref 3.5–5.1)
Sodium: 142 mmol/L (ref 135–145)

## 2017-11-21 MED ORDER — K PHOS MONO-SOD PHOS DI & MONO 155-852-130 MG PO TABS
500.0000 mg | ORAL_TABLET | Freq: Three times a day (TID) | ORAL | Status: AC
  Administered 2017-11-21 – 2017-11-22 (×5): 500 mg via ORAL
  Filled 2017-11-21 (×6): qty 2

## 2017-11-21 NOTE — Progress Notes (Signed)
Initial Nutrition Assessment  DOCUMENTATION CODES:   Not applicable  INTERVENTION:  1. Ensure Enlive po BID, each supplement provides 350 kcal and 20 grams of protein  NUTRITION DIAGNOSIS:   Inadequate oral intake related to lethargy/confusion as evidenced by meal completion < 25%.  GOAL:   Patient will meet greater than or equal to 90% of their needs  MONITOR:   PO intake, I & O's, Labs, Weight trends, Supplement acceptance  REASON FOR ASSESSMENT:   Consult Assessment of nutrition requirement/status  ASSESSMENT:   Mr. Almquist is a 72 yo male with PMH Dementia, DM Type 2, HTN, currently incarcerated, presents with AKI, FTT, Hypernatremia  Spoke with Mr. Wassenaar and Sheriff at bedside. Mr. Knoth provides is a poor historian but per NT and RN he has been refusing meals since admission. Does not want to eat. Took a few sips of ensure today. Says he has lost weight but is unsure of weight loss. It appears per chart he has lost 18 pounds/10.5% of his total body weight over the past 2 months. 15/30 on MMSE yesterday. Unsure what long term plan is but if current mentation is baseline PO intake will continue to be poor. Monitor GOC.  Labs reviewed:  CBGs 149, 137, 138 Phos 1.7,  Medications reviewed and include:  Phos, Insulin D5 1/2 NS 40 K+ at 30mL/hr --> 306 calories  NUTRITION - FOCUSED PHYSICAL EXAM:    Most Recent Value  Orbital Region  No depletion  Upper Arm Region  Mild depletion  Thoracic and Lumbar Region  No depletion  Buccal Region  No depletion  Temple Region  Mild depletion  Clavicle Bone Region  No depletion  Clavicle and Acromion Bone Region  No depletion  Scapular Bone Region  Unable to assess  Dorsal Hand  Mild depletion  Patellar Region  Mild depletion  Anterior Thigh Region  Mild depletion  Posterior Calf Region  Mild depletion  Edema (RD Assessment)  None    May be related to aging. Unsure.  Diet Order:  Diet Carb Modified Fluid consistency: Thin;  Room service appropriate? Yes  EDUCATION NEEDS:   Not appropriate for education at this time  Skin:  Skin Assessment: Reviewed RN Assessment  Last BM:  PTA  Height:   Ht Readings from Last 1 Encounters:  09/09/17  (1.702 m)    Weight:   Wt Readings from Last 1 Encounters:  11/16/17 152 lb (68.9 kg)    Ideal Body Weight:  67.27 kg  BMI:  Body mass index is 23.81 kg/m.  Estimated Nutritional Needs:   Kcal:  1700-1830 calories (MSJ x1.2-1.3)  Protein:  83-103 grams (1.2-1.5g/kg)  Fluid:  1.7-1.9L    Dionne Ano. Joelyn Lover, MS, RD LDN Inpatient Clinical Dietitian Pager [phone]

## 2017-11-21 NOTE — Progress Notes (Signed)
Physical Therapy Treatment Patient Details Name: Richard Powers MRN: [id number] DOB: April 18, 1946 Today's Date: 11/21/2017    History of Present Illness Richard Powers is a 72 y.o. male admitted from jail with medical history significant of Dementia, DM 2, HTN. Found to have Acute encephalopathy, Dehydration and AKI    PT Comments    Pt is making slow progress towards his goals today, however continues to be limited by decreased strength and balance. Pt is currently min A for bed mobility, min guard for transfers and minAx1 for ambulation of 30 feet with RW. Pt becomes agitated when fatigued and can not be redirected to further ambulation, requesting to go back to bed. Current d/c plans continue to remain appropriate at this time. PT will continue to follow acutely until d/c.    Follow Up Recommendations  Other (comment)(whatever skilled PT services is available to patient in [place])     Administrator, arts walker with 5" wheels    Recommendations for Other Services       Precautions / Restrictions Precautions Precautions: Fall;Other (comment) Precaution Comments: can become agitated especially when fatigued  Restrictions Weight Bearing Restrictions: No    Mobility  Bed Mobility Overal bed mobility: Needs Assistance Bed Mobility: Supine to Sit     Supine to sit: Min guard     General bed mobility comments: min guard for line management and safety  Transfers Overall transfer level: Needs assistance Equipment used: Rolling walker (2 wheeled) Transfers: Sit to/from Stand Sit to Stand: Min guard         General transfer comment: min guard for safety, vc for safe hand placement for powerup   Ambulation/Gait Ambulation/Gait assistance: Min assist Ambulation Distance (Feet): 30 Feet Assistive device: Rolling walker (2 wheeled) Gait Pattern/deviations: Step-through pattern;Decreased step length - right;Decreased step length -  left;Decreased stride length;Trunk flexed Gait velocity: slowed Gait velocity interpretation: Below normal speed for age/gender General Gait Details: gait pattern limited by shackles, verbal and tactile cues for proximity to walker, RW management. Pt became vocal about wanting to go back to bed after he was out in the hallway and could not be encouraged for further ambualtion      Balance Overall balance assessment: Needs assistance Sitting-balance support: No upper extremity supported;Feet supported Sitting balance-Leahy Scale: Good     Standing balance support: No upper extremity supported Standing balance-Leahy Scale: Poor                              Cognition Arousal/Alertness: Awake/alert Behavior During Therapy: WFL for tasks assessed/performed Overall Cognitive Status: No family/caregiver present to determine baseline cognitive functioning Area of Impairment: Orientation;Problem solving;Safety/judgement                 Orientation Level: Person;Place;Situation       Safety/Judgement: Decreased awareness of safety;Decreased awareness of deficits   Problem Solving: Requires verbal cues               Pertinent Vitals/Pain Pain Assessment: No/denies pain Faces Pain Scale: No hurt           PT Goals (current goals can now be found in the care plan section) Acute Rehab PT Goals Patient Stated Goal: to be warm  PT Goal Formulation: With patient Progress towards PT goals: Progressing toward goals    Frequency    Min 3X/week      PT Plan Current plan remains appropriate;Equipment recommendations need  to be updated       AM-PAC PT "6 Clicks" Daily Activity  Outcome Measure  Difficulty turning over in bed (including adjusting bedclothes, sheets and blankets)?: Unable Difficulty moving from lying on back to sitting on the side of the bed? : Unable Difficulty sitting down on and standing up from a chair with arms (e.g., wheelchair,  bedside commode, etc,.)?: Unable Help needed moving to and from a bed to chair (including a wheelchair)?: A Little Help needed walking in hospital room?: A Little Help needed climbing 3-5 steps with a railing? : A Lot 6 Click Score: 11    End of Session Equipment Utilized During Treatment: Gait belt Activity Tolerance: Patient tolerated treatment well Patient left: with bed alarm set;in bed;with call bell/phone within reach;with family/visitor present(law enforcement officer present) Nurse Communication: Mobility status PT Visit Diagnosis: Unsteadiness on feet (R26.81);Muscle weakness (generalized) (M62.81);Difficulty in walking, not elsewhere classified (R26.2)     Time: [id number] PT Time Calculation (min) (ACUTE ONLY): 19 min  Charges:  $Gait Training: 8-22 mins                    G Codes:       Mario Coronado B. Beverely RisenVan Fleet PT, DPT Acute Rehabilitation  [phone] Pager [phone]     Elon Alaslizabeth B Van Fleet 11/21/2017, 2:17 PM

## 2017-11-21 NOTE — Progress Notes (Signed)
PROGRESS NOTE  Richard Powers [place] III WUJ:[id number] DOB: 1945/11/20 DOA: 11/16/2017 PCP: [name], MD  HPI/Recap of past 97 hours: 72 year old male with medical history significant for dementia, DM type 2, hypertension, currently incarcerated, presents to the ED with acute renal failure after labs on 3/18 were obtained from jail showed elevated creatinine likely due to poor p.o. Intake.  In the ED patient was noted to be hypotensive, tachycardic, with elevated lactic acid.  Patient was subsequently hydrated, and started on empiric antibiotics.  Patient noted to be a poor historian.  Patient has been in [place] jail since October 21, 2017.  Last hospitalization was September 09, 2017 for sepsis and dehydration due to influenza.  Patient admitted for further management  Today, noted with poor oral intake, alert, refuses to be interactive. Denies any chest pain, shortness of breath, abdominal pain, headaches.  11/21/2017-patient seen.  No new changes.  We will continue current management.  Will liaise with the jail providers, as well as Child psychotherapist and court liaison.  Ideally, patient will need placement, as it will be unsafe for the patient to return back to jail (this will depend on the legal system's decision).  Assessment/Plan: Principal Problem:   Dementia Active Problems:   Essential hypertension   DM II (diabetes mellitus, type II), controlled (HCC)   Dehydration   AKI (acute kidney injury) (HCC)   Acute encephalopathy   Elevated lactic acid level  Failure to thrive in the setting of dementia Refusing to eat food/drink, generalized weakness Currently afebrile, no leukocytosis, improved BP s/p IVF Hypotensive, tachycardic, elevated LA on admission Unlikely sepsis, likely due to severe dehydration Lactic acid trended down to normal, s/p 2.5L of IVF Pro-calcitonin negative UA negative for infection, urine culture no growth Blood culture no growth till date Chest  x-ray negative We will discontinue empiric IV Zosyn plus vancomycin Changed IVF to D5W due to hypernatremia Dietitian consult placed, advised RN to give patient Ensure 3 times daily  AKI Improving BUN and creatinine improving status post IVF Likely due to dehydration Renal ultrasound normal Continue IVF Strict I's and O's, avoid nephrotoxins, hold home lisinopril + HCT Daily BMP  Hypernatremia, resolved:  Elevated d-dimer D-dimer 7.02 VQ scan negative for PE  Type 2 diabetes mellitus A1c 9.6 Sensitive SSI, Accu-Cheks Held home metformin, saxagliptin, insulin regular  Hypertension Stable Held lisinopril + HCT due to AKI + hypotension on admission May start norvasc if BP uncontrolled  Dementia Continue aricept    Code Status: Full  Family Communication: None at bedside  Disposition Plan: Back to jail once creatinine back to baseline   Consultants:  None  Procedures:  None  Antimicrobials:  S/P IV vancomycin plus Zosyn  DVT prophylaxis: Lovenox  Objective: Vitals:   11/20/17 0420 11/20/17 1548 11/20/17 2109 11/21/17 0420  BP: 123/60 129/72 (!) 144/75 120/83  Pulse: 69 65 62 71  Resp: 18 16 18 18   Temp: 98.6 F (37 C) 98.9 F (37.2 C) 99.2 F (37.3 C) 99 F (37.2 C)  TempSrc: Oral Oral Oral Oral  SpO2: 99% 99% 100% 100%  Weight:        Intake/Output Summary (Last 24 hours) at 11/21/2017 1000 Last data filed at 11/20/2017 1858 Gross per 24 hour  Intake 488.75 ml  Output -  Net 488.75 ml   Filed Weights   11/16/17 1401  Weight: 68.9 kg (152 lb)    Exam:   General: Lethargic.  Not able to give any significant history.  Cardiovascular: S1, S2 present  Respiratory: CTAB  Abdomen: Soft, nontender, nondistended, bowel sounds present  Musculoskeletal: No pedal edema bilaterally  Skin: Normal     Data Reviewed: CBC: Recent Labs  Lab 11/16/17 1356 11/17/17 0133 11/18/17 0534 11/19/17 0552 11/20/17 0624 11/21/17 0737    WBC 6.3 8.6 6.0 6.0 5.5 4.7  NEUTROABS 4.0  --  4.4 3.7 3.3 2.4  HGB 15.6 12.7* 13.3 10.8* 10.4* 10.7*  HCT 46.1 38.8* 42.5 33.3* 32.0* 32.7*  MCV 90.4 90.9 92.6 92.0 89.1 90.3  PLT 218 152 139* 123* 118* 101*   Basic Metabolic Panel: Recent Labs  Lab 11/17/17 0133 11/17/17 0819 11/18/17 0534 11/19/17 0552 11/20/17 0624 11/21/17 0737  NA 149*  --  153* 148* 145 142  K 3.3*  --  4.0 3.6 3.1* 3.6  CL 116*  --  117* 114* 114* 114*  CO2 19*  --  20* 21* 24 23  GLUCOSE 175*  --  103* 152* 170* 132*  BUN 86*  --  62* 41* 24* 15  CREATININE 3.21*  --  2.76* 2.02* 1.66* 1.45*  CALCIUM 8.2*  --  9.0 8.3* 8.4* 8.2*  MG  --  2.5*  --   --   --   --   PHOS  --  4.7*  --   --   --  1.7*   GFR: Estimated Creatinine Clearance: 43.7 mL/min (A) (by C-G formula based on SCr of 1.45 mg/dL (H)). Liver Function Tests: Recent Labs  Lab 11/16/17 1356 11/17/17 0133 11/21/17 0737  AST 29 22  --   ALT 14* 15*  --   ALKPHOS 42 31*  --   BILITOT 1.5* 1.9*  --   PROT 8.0 6.2*  --   ALBUMIN 4.2 3.2* 2.6*   Recent Labs  Lab 11/16/17 1621  LIPASE 79*   No results for input(s): AMMONIA in the last 168 hours. Coagulation Profile: Recent Labs  Lab 11/16/17 2205  INR 1.17   Cardiac Enzymes: Recent Labs  Lab 11/16/17 2205 11/17/17 0133 11/17/17 0819  CKTOTAL 59  --   --   TROPONINI <0.03 <0.03 <0.03   BNP (last 3 results) No results for input(s): PROBNP in the last 8760 hours. HbA1C: No results for input(s): HGBA1C in the last 72 hours. CBG: Recent Labs  Lab 11/20/17 2019 11/21/17 0134 11/21/17 0419 11/21/17 0452 11/21/17 0826  GLUCAP 162* 144* 144* 138* 137*   Lipid Profile: No results for input(s): CHOL, HDL, LDLCALC, TRIG, CHOLHDL, LDLDIRECT in the last 72 hours. Thyroid Function Tests: No results for input(s): TSH, T4TOTAL, FREET4, T3FREE, THYROIDAB in the last 72 hours. Anemia Panel: No results for input(s): VITAMINB12, FOLATE, FERRITIN, TIBC, IRON, RETICCTPCT in  the last 72 hours. Urine analysis:    Component Value Date/Time   COLORURINE YELLOW 11/16/2017 1630   APPEARANCEUR CLEAR 11/16/2017 1630   LABSPEC 1.021 11/16/2017 1630   PHURINE 5.0 11/16/2017 1630   GLUCOSEU NEGATIVE 11/16/2017 1630   HGBUR NEGATIVE 11/16/2017 1630   BILIRUBINUR NEGATIVE 11/16/2017 1630   KETONESUR 20 (A) 11/16/2017 1630   PROTEINUR NEGATIVE 11/16/2017 1630   UROBILINOGEN 0.2 02/22/2015 2331   NITRITE NEGATIVE 11/16/2017 1630   LEUKOCYTESUR NEGATIVE 11/16/2017 1630   Sepsis Labs: @LABRCNTIP (procalcitonin:4,lacticidven:4)  ) Recent Results (from the past 240 hour(s))  Blood Culture (routine x 2)     Status: None (Preliminary result)   Collection Time: 11/16/17  1:56 PM  Result Value Ref Range Status   Specimen Description BLOOD LEFT  ANTECUBITAL  Final   Special Requests   Final    BOTTLES DRAWN AEROBIC ONLY Blood Culture results may not be optimal due to an inadequate volume of blood received in culture bottles   Culture   Final    NO GROWTH 4 DAYS Performed at [facility], [address], [place], [place] [address]    Report Status PENDING  Incomplete  Urine culture     Status: None   Collection Time: 11/16/17  4:30 PM  Result Value Ref Range Status   Specimen Description URINE, RANDOM  Final   Special Requests NONE  Final   Culture   Final    NO GROWTH Performed at [facility], [address], [place], [place] [address]    Report Status 11/17/2017 FINAL  Final  Culture, blood (Routine X 2) w Reflex to ID Panel     Status: None (Preliminary result)   Collection Time: 11/16/17 10:00 PM  Result Value Ref Range Status   Specimen Description BLOOD RIGHT HAND  Final   Special Requests IN PEDIATRIC BOTTLE Blood Culture adequate volume  Final   Culture   Final    NO GROWTH 4 DAYS Performed at [facility], [address], [place], [place] [address]    Report Status PENDING  Incomplete  MRSA PCR Screening     Status: None    Collection Time: 11/17/17 12:48 AM  Result Value Ref Range Status   MRSA by PCR NEGATIVE NEGATIVE Final    Comment:        The GeneXpert MRSA Assay (FDA approved for NASAL specimens only), is one component of a comprehensive MRSA colonization surveillance program. It is not intended to diagnose MRSA infection nor to guide or monitor treatment for MRSA infections. Performed at [facility], [address] [place], [place] [address]       Studies: No results found.  Scheduled Meds: . donepezil  10 mg Oral QHS  . enoxaparin (LOVENOX) injection  30 mg Subcutaneous Daily  . feeding supplement (ENSURE ENLIVE)  237 mL Oral BID BM  . guaiFENesin  600 mg Oral BID  . insulin aspart  0-9 Units Subcutaneous Q4H  . ketotifen  1 drop Both Eyes BID  . phosphorus  500 mg Oral TID  . simvastatin  40 mg Oral Daily  . thiamine  100 mg Oral Daily    Continuous Infusions: . dextrose 5 % and 0.45 % NaCl with KCl 40 mEq/L 75 mL/hr at 11/21/17 0149     LOS: 5 days     Barnetta Chapel, MD [facility]   If 7PM-7AM, please contact night-coverage [email or web address] Password [email or web address] 11/21/2017, 10:00 AM

## 2017-11-22 LAB — CBC WITH DIFFERENTIAL/PLATELET
BASOS ABS: 0 10*3/uL (ref 0.0–0.1)
Basophils Relative: 0 %
EOS PCT: 6 %
Eosinophils Absolute: 0.3 10*3/uL (ref 0.0–0.7)
HCT: 31.6 % — ABNORMAL LOW (ref 39.0–52.0)
Hemoglobin: 10.3 g/dL — ABNORMAL LOW (ref 13.0–17.0)
LYMPHS PCT: 32 %
Lymphs Abs: 1.6 10*3/uL (ref 0.7–4.0)
MCH: 28.6 pg (ref 26.0–34.0)
MCHC: 32.6 g/dL (ref 30.0–36.0)
MCV: 87.8 fL (ref 78.0–100.0)
MONO ABS: 0.4 10*3/uL (ref 0.1–1.0)
Monocytes Relative: 8 %
Neutro Abs: 2.7 10*3/uL (ref 1.7–7.7)
Neutrophils Relative %: 54 %
PLATELETS: 119 10*3/uL — AB (ref 150–400)
RBC: 3.6 MIL/uL — ABNORMAL LOW (ref 4.22–5.81)
RDW: 13.1 % (ref 11.5–15.5)
WBC: 4.9 10*3/uL (ref 4.0–10.5)

## 2017-11-22 LAB — GLUCOSE, CAPILLARY
GLUCOSE-CAPILLARY: 223 mg/dL — AB (ref 65–99)
GLUCOSE-CAPILLARY: 233 mg/dL — AB (ref 65–99)
Glucose-Capillary: 146 mg/dL — ABNORMAL HIGH (ref 65–99)
Glucose-Capillary: 140 mg/dL — ABNORMAL HIGH (ref 65–99)
Glucose-Capillary: 130 mg/dL — ABNORMAL HIGH (ref 65–99)
Glucose-Capillary: 262 mg/dL — ABNORMAL HIGH (ref 65–99)

## 2017-11-22 LAB — BASIC METABOLIC PANEL
ANION GAP: 8 (ref 5–15)
BUN: 12 mg/dL (ref 6–20)
CO2: 24 mmol/L (ref 22–32)
CREATININE: 1.37 mg/dL — AB (ref 0.61–1.24)
Calcium: 8.1 mg/dL — ABNORMAL LOW (ref 8.9–10.3)
Chloride: 111 mmol/L (ref 101–111)
GFR calc Af Amer: 58 mL/min — ABNORMAL LOW (ref >60)
GFR, EST NON AFRICAN AMERICAN: 50 mL/min — AB (ref >60)
GLUCOSE: 135 mg/dL — AB (ref 65–99)
Potassium: 3.8 mmol/L (ref 3.5–5.1)
Sodium: 143 mmol/L (ref 135–145)

## 2017-11-22 MED ORDER — HYDROCERIN EX CREA
TOPICAL_CREAM | Freq: Two times a day (BID) | CUTANEOUS | Status: DC
  Administered 2017-11-22 – 2017-11-25 (×7): via TOPICAL
  Filled 2017-11-22: qty 113

## 2017-11-22 MED ORDER — ENOXAPARIN SODIUM 40 MG/0.4ML ~~LOC~~ SOLN
40.0000 mg | Freq: Every day | SUBCUTANEOUS | Status: DC
  Administered 2017-11-22 – 2017-11-25 (×4): 40 mg via SUBCUTANEOUS
  Filled 2017-11-22 (×3): qty 0.4

## 2017-11-22 MED ORDER — MEGESTROL ACETATE 400 MG/10ML PO SUSP
400.0000 mg | Freq: Two times a day (BID) | ORAL | Status: DC
  Administered 2017-11-22 – 2017-11-25 (×7): 400 mg via ORAL
  Filled 2017-11-22 (×8): qty 10

## 2017-11-22 NOTE — Progress Notes (Signed)
Per this RN's shift thus far, patient's HR has been stable and below 100. Will continue to monitor

## 2017-11-22 NOTE — Care Management Important Message (Signed)
Important Message  Patient Details  Name: Genia DelWilliam [place] III MRN: [id number] Date of Birth: 1946/01/15   Medicare Important Message Given:  Yes    Dorena BodoIris Katherleen Folkes 11/22/2017, 2:01 PM

## 2017-11-22 NOTE — Progress Notes (Signed)
PROGRESS NOTE  Richard Powers NWG:[id number] DOB: 11/06/1945 DOA: 11/16/2017 PCP: [name], MD  HPI/Recap of past 86 hours: 72 year old male with medical history significant for documented dementia, DM type 2, and hypertension.  Patient was admitted from jail, but has just been released from the jail today, 11/22/2017.  Patient presented to the ED with acute kidney injury noted after labs on 3/18 were obtained from jail showed elevated creatinine.  Patient was reported to have significantly poor p.o. intake while in jail.  On presentation to the the ED, patient was noted to be hypotensive, tachycardic, with elevated lactic acid.  Patient was admitted for further assessment and management.  Initially, patient was aggressively hydrated, and started on empiric antibiotics.  Acute kidney injury has continued to improve.  Acute encephalopathy seems to have improved significantly.  There were concerns for moderate dementing illness.  However, has been reviewed patient today, 11/22/2017, in the presence of the case manager, Richard Powers, patient's niece and her husband, there seems to be a significant amount of acute encephalopathy in patient's psychopathology.  Will await complete resolution of acute encephalopathy before determining the extent of patient's cognitive impairment.  Will consult physical therapy and Occupational Therapy, as patient may even be a candidate for acute rehabilitation.  Will await assessment from the physical therapy and occupational therapy.  Will continue to assess patient for now, and final disposition will be determined when patient is fully optimized.  No new complaints today, 11/22/2017.  Patient continues to make improvement.  However, patient is not back to his baseline.  Assessment/Plan: Principal Problem:   Dementia Active Problems:   Essential hypertension   DM II (diabetes mellitus, type II), controlled (HCC)   Dehydration   AKI (acute kidney injury)  (HCC)   Acute encephalopathy   Elevated lactic acid level  Failure to thrive in the setting of dementia Refusing to eat food/drink, generalized weakness Currently afebrile, no leukocytosis, improved BP s/p IVF Hypotensive, tachycardic, elevated LA on admission Unlikely sepsis, likely due to severe dehydration Lactic acid trended down to normal, s/p 2.5L of IVF Pro-calcitonin negative UA negative for infection, urine culture no growth Blood culture no growth till date Chest x-ray negative We will discontinue empiric IV Zosyn plus vancomycin Changed IVF to D5W due to hypernatremia Dietitian consult placed, advised RN to give patient Ensure 3 times daily  AKI: Improved significantly.  BUN and creatinine improving status post IVF Likely due to dehydration Renal ultrasound normal Continue IVF Strict I's and O's, avoid nephrotoxins, hold home lisinopril + HCT Daily BMP  Hypernatremia, resolved:  Elevated d-dimer D-dimer 7.02 VQ scan negative for PE  Type 2 diabetes mellitus A1c 9.6 Sensitive SSI, Accu-Cheks Held home metformin, saxagliptin, insulin regular  Hypertension Stable Held lisinopril + HCT due to AKI + hypotension on admission May start norvasc if BP uncontrolled  Possible dementia: -Degree of dementia will need to be reassessed after patient's acute encephalopathy has resolved completely. -Degree of dementia may not be as much as initially thought. -Have a low threshold to reconsult psychiatry and/or neurology team to reassess patient's dementing process if needed. -Meanwhile, continue Aricept for now.  Exfoliative skin lesions plantar surface of both feet: -Soak both feet daily. -Use Eucerin or other moisturizing cream. -Continue to assess for possible secondary fungal infection.  Code Status: Full  Family Communication: Niece and her husband.  Disposition Plan: To be determined when patient is fully  optimized.   Consultants:  None  Procedures:  None  Antimicrobials:  S/P IV vancomycin plus Zosyn  DVT prophylaxis: Lovenox  Objective: Vitals:   11/21/17 0420 11/21/17 1327 11/21/17 2155 11/22/17 0504  BP: 120/83 (!) 147/74 134/69 130/75  Pulse: 71 65 74 75  Resp: 18 14 12 16   Temp: 99 F (37.2 C) 98.1 F (36.7 C) 99.6 F (37.6 C) 99.1 F (37.3 C)  TempSrc: Oral Oral Oral Oral  SpO2: 100% 98% 99% 96%  Weight:    75.9 kg (167 lb 5.3 oz)    Intake/Output Summary (Last 24 hours) at 11/22/2017 0941 Last data filed at 11/22/2017 0005 Gross per 24 hour  Intake -  Output 700 ml  Net -700 ml   Filed Weights   11/16/17 1401 11/22/17 0504  Weight: 68.9 kg (152 lb) 75.9 kg (167 lb 5.3 oz)    Exam:   General: Patient has improved significantly.  Patient is able to hold conversation to a significant level today.  Patient is not in any obvious distress.  Patient is awake and alert, and seems oriented to place.    Cardiovascular: S1, S2 present  Respiratory: CTAB  Abdomen: Soft, nontender, nondistended, bowel sounds present  Musculoskeletal: No pedal edema bilaterally  Skin: Significant exfoliation both feet.       Data Reviewed: CBC: Recent Labs  Lab 11/18/17 0534 11/19/17 0552 11/20/17 0624 11/21/17 0737 11/22/17 0613  WBC 6.0 6.0 5.5 4.7 4.9  NEUTROABS 4.4 3.7 3.3 2.4 2.7  HGB 13.3 10.8* 10.4* 10.7* 10.3*  HCT 42.5 33.3* 32.0* 32.7* 31.6*  MCV 92.6 92.0 89.1 90.3 87.8  PLT 139* 123* 118* 101* 119*   Basic Metabolic Panel: Recent Labs  Lab 11/17/17 0819 11/18/17 0534 11/19/17 0552 11/20/17 0624 11/21/17 0737 11/22/17 0613  NA  --  153* 148* 145 142 143  K  --  4.0 3.6 3.1* 3.6 3.8  CL  --  117* 114* 114* 114* 111  CO2  --  20* 21* 24 23 24   GLUCOSE  --  103* 152* 170* 132* 135*  BUN  --  62* 41* 24* 15 12  CREATININE  --  2.76* 2.02* 1.66* 1.45* 1.37*  CALCIUM  --  9.0 8.3* 8.4* 8.2* 8.1*  MG 2.5*  --   --   --   --   --   PHOS 4.7*   --   --   --  1.7*  --    GFR: Estimated Creatinine Clearance: 46.2 mL/min (A) (by C-G formula based on SCr of 1.37 mg/dL (H)). Liver Function Tests: Recent Labs  Lab 11/16/17 1356 11/17/17 0133 11/21/17 0737  AST 29 22  --   ALT 14* 15*  --   ALKPHOS 42 31*  --   BILITOT 1.5* 1.9*  --   PROT 8.0 6.2*  --   ALBUMIN 4.2 3.2* 2.6*   Recent Labs  Lab 11/16/17 1621  LIPASE 79*   No results for input(s): AMMONIA in the last 168 hours. Coagulation Profile: Recent Labs  Lab 11/16/17 2205  INR 1.17   Cardiac Enzymes: Recent Labs  Lab 11/16/17 2205 11/17/17 0133 11/17/17 0819  CKTOTAL 59  --   --   TROPONINI <0.03 <0.03 <0.03   BNP (last 3 results) No results for input(s): PROBNP in the last 8760 hours. HbA1C: No results for input(s): HGBA1C in the last 72 hours. CBG: Recent Labs  Lab 11/21/17 1629 11/21/17 2003 11/22/17 0244 11/22/17 0406 11/22/17 0809  GLUCAP 187* 148* 146* 140* 130*   Lipid Profile: No results for input(s):  CHOL, HDL, LDLCALC, TRIG, CHOLHDL, LDLDIRECT in the last 72 hours. Thyroid Function Tests: No results for input(s): TSH, T4TOTAL, FREET4, T3FREE, THYROIDAB in the last 72 hours. Anemia Panel: No results for input(s): VITAMINB12, FOLATE, FERRITIN, TIBC, IRON, RETICCTPCT in the last 72 hours. Urine analysis:    Component Value Date/Time   COLORURINE YELLOW 11/16/2017 1630   APPEARANCEUR CLEAR 11/16/2017 1630   LABSPEC 1.021 11/16/2017 1630   PHURINE 5.0 11/16/2017 1630   GLUCOSEU NEGATIVE 11/16/2017 1630   HGBUR NEGATIVE 11/16/2017 1630   BILIRUBINUR NEGATIVE 11/16/2017 1630   KETONESUR 20 (A) 11/16/2017 1630   PROTEINUR NEGATIVE 11/16/2017 1630   UROBILINOGEN 0.2 02/22/2015 2331   NITRITE NEGATIVE 11/16/2017 1630   LEUKOCYTESUR NEGATIVE 11/16/2017 1630   Sepsis Labs: @LABRCNTIP (procalcitonin:4,lacticidven:4)  ) Recent Results (from the past 240 hour(s))  Blood Culture (routine x 2)     Status: None   Collection Time:  11/16/17  1:56 PM  Result Value Ref Range Status   Specimen Description BLOOD LEFT ANTECUBITAL  Final   Special Requests   Final    BOTTLES DRAWN AEROBIC ONLY Blood Culture results may not be optimal due to an inadequate volume of blood received in culture bottles   Culture   Final    NO GROWTH 5 DAYS Performed at [facility], [address] [place], [place] [address]    Report Status 11/21/2017 FINAL  Final  Urine culture     Status: None   Collection Time: 11/16/17  4:30 PM  Result Value Ref Range Status   Specimen Description URINE, RANDOM  Final   Special Requests NONE  Final   Culture   Final    NO GROWTH Performed at [facility], [address] [place], [place] [address]    Report Status 11/17/2017 FINAL  Final  Culture, blood (Routine X 2) w Reflex to ID Panel     Status: None   Collection Time: 11/16/17 10:00 PM  Result Value Ref Range Status   Specimen Description BLOOD RIGHT HAND  Final   Special Requests IN PEDIATRIC BOTTLE Blood Culture adequate volume  Final   Culture   Final    NO GROWTH 5 DAYS Performed at [facility], [address] [place], [place] [address]    Report Status 11/21/2017 FINAL  Final  MRSA PCR Screening     Status: None   Collection Time: 11/17/17 12:48 AM  Result Value Ref Range Status   MRSA by PCR NEGATIVE NEGATIVE Final    Comment:        The GeneXpert MRSA Assay (FDA approved for NASAL specimens only), is one component of a comprehensive MRSA colonization surveillance program. It is not intended to diagnose MRSA infection nor to guide or monitor treatment for MRSA infections. Performed at [facility], [address] [place], [place] [address]       Studies: No results found.  Scheduled Meds: . donepezil  10 mg Oral QHS  . enoxaparin (LOVENOX) injection  40 mg Subcutaneous Daily  . feeding supplement (ENSURE ENLIVE)  237 mL Oral BID BM  . guaiFENesin  600 mg Oral BID  . insulin aspart  0-9  Units Subcutaneous Q4H  . ketotifen  1 drop Both Eyes BID  . megestrol  400 mg Oral BID  . phosphorus  500 mg Oral TID  . simvastatin  40 mg Oral Daily  . thiamine  100 mg Oral Daily    Continuous Infusions: .  dextrose 5 % and 0.45 % NaCl with KCl 40 mEq/L 75 mL/hr at 11/21/17 1515     LOS: 6 days     Barnetta Chapel, MD [facility]   If 7PM-7AM, please contact night-coverage [email or web address] Password [facility] 11/22/2017, 9:41 AM

## 2017-11-22 NOTE — Plan of Care (Signed)
  Problem: Nutrition: Goal: Adequate nutrition will be maintained Description Pt tolerated 75% of breakfast with additional intake of ensure supplement with assistance from staff. 11/22/2017 1240 by Aldean AstBurton, Liem Copenhaver C, RN Outcome: Progressing 11/22/2017 1238 by Aldean AstBurton, Yannis Broce C, RN Outcome: Progressing

## 2017-11-22 NOTE — Progress Notes (Signed)
CSW and RNCM spoke with patient's [facility] case manager, April. She states patient is not service connected (and has no SNF benefits). RNCM left voicemails for patient's sisters to discuss discharge plan.  Osborne Cascoadia Laure Leone LCSW [phone]

## 2017-11-22 NOTE — Progress Notes (Signed)
NCM left voice message with pt's sisters, Enid SkeensSusan [phone]) and [name] Matier [phone]) to call to discuss discharge plan. Gae GallopAngela Wilbur Labuda RN,BSN,CM

## 2017-11-22 NOTE — Progress Notes (Signed)
NCM met with [name] (niece, not sister per [name]). [name] is pt's only relative in [place]. [name] states pt has a living 72 y/o sister whom she has tried to reach that lives in [place]. However, pt's sister has not responded to her. [name] states 10 years ago pt lived @ [place] and was evicted in November 2018. From there he took residence @ the [facility] (homeless shelter) until he was jailed in February 2019. [name] states pt is homeless, she can't care for pt. She has children in the home and her husband is scheduled to have hip surgery soon.  MD made aware [facility] @ bedside. MD stated will come to speak with her. Whitman Hero RN,BSN,CM

## 2017-11-22 NOTE — Progress Notes (Signed)
CCMD notified RN pts HR sustaining in 150's BP 94/63, pt sitting up in chair talking asymptomatic MD notified will continue to monitor

## 2017-11-22 NOTE — Progress Notes (Signed)
Pt refused IV stick, explained to pt that he had continuous fluids that needed to be infused for his potassium. Pt still refused.

## 2017-11-22 NOTE — Progress Notes (Signed)
Occupational Therapy Treatment Patient Details Name: Moe Graca MRN: [id number] DOB: 1946-05-22 Today's Date: 11/22/2017    History of present illness Salil Raineri III is a 72 y.o. male admitted from jail with medical history significant of Dementia, DM 2, HTN. Found to have Acute encephalopathy, Dehydration and AKI   OT comments  Pt progressing towards goals, presents supine in bed agreeable to treatment session. Pt requires repetition and multimodal cues to follow commands this session. Pt completing sit<>stand from EOB using RW with MinA, requires MaxA for LB ADLs. Mobility limited this session due to increased HR upon standing (up to 139). If pt is not returning to previous location of incarceration, anticipate pt will need 24 hr supervision/assist after discharge. Pending availability of 24hr assist may need to consider SNF placement. Will continue to follow acutely to progress pt towards established OT goals.     Follow Up Recommendations  Supervision/Assistance - 24 hour;Other (comment)(if pt not able to have 24hr, may require SNF placement)    Equipment Recommendations  None recommended by OT          Precautions / Restrictions Precautions Precautions: Fall;Other (comment) Precaution Comments: can become agitated especially when fatigued; watch HR  Restrictions Weight Bearing Restrictions: No       Mobility Bed Mobility Overal bed mobility: Needs Assistance Bed Mobility: Supine to Sit;Sit to Supine     Supine to sit: Min assist Sit to supine: Min assist   General bed mobility comments: minA for management of LE out of bed as well as to return to supine  Transfers Overall transfer level: Needs assistance Equipment used: Rolling walker (2 wheeled) Transfers: Sit to/from Stand Sit to Stand: Min assist         General transfer comment: assist to rise and steady at RW; VC's for safe hand placement     Balance Overall balance  assessment: Needs assistance Sitting-balance support: No upper extremity supported;Feet supported Sitting balance-Leahy Scale: Good     Standing balance support: No upper extremity supported Standing balance-Leahy Scale: Poor                             ADL either performed or assessed with clinical judgement   ADL Overall ADL's : Needs assistance/impaired Eating/Feeding: Sitting;Set up                   Lower Body Dressing: Maximal assistance;Sit to/from stand Lower Body Dressing Details (indicate cue type and reason): total assist to don socks sitting EOB              Functional mobility during ADLs: Minimal assistance;Rolling walker General ADL Comments: pt completing bed mobility and sit<>stand at RW during session; increased HR this session (up to 139 upon standing) therefore further mobility deferred, pt returned to sitting/supine in bed                        Cognition Arousal/Alertness: Awake/alert Behavior During Therapy: WFL for tasks assessed/performed Overall Cognitive Status: No family/caregiver present to determine baseline cognitive functioning Area of Impairment: Problem solving;Safety/judgement;Following commands                       Following Commands: Follows one step commands inconsistently Safety/Judgement: Decreased awareness of safety;Decreased awareness of deficits   Problem Solving: Requires verbal cues;Requires tactile cues General Comments: pt requires repetition, cues, and increased time to follow  one step commands                           Pertinent Vitals/ Pain       Pain Assessment: No/denies pain                                                          Frequency  Min 2X/week        Progress Toward Goals  OT Goals(current goals can now be found in the care plan section)  Progress towards OT goals: Progressing toward goals  Acute Rehab OT Goals Patient Stated  Goal: none stated  OT Goal Formulation: With patient Time For Goal Achievement: 12/01/17 Potential to Achieve Goals: Good  Plan Discharge plan remains appropriate                     AM-PAC PT "6 Clicks" Daily Activity     Outcome Measure   Help from another person eating meals?: None Help from another person taking care of personal grooming?: A Little Help from another person toileting, which includes using toliet, bedpan, or urinal?: A Lot Help from another person bathing (including washing, rinsing, drying)?: A Lot Help from another person to put on and taking off regular upper body clothing?: A Little Help from another person to put on and taking off regular lower body clothing?: A Lot 6 Click Score: 16    End of Session Equipment Utilized During Treatment: Gait belt;Rolling walker  OT Visit Diagnosis: Unsteadiness on feet (R26.81);Other symptoms and signs involving cognitive function   Activity Tolerance Patient tolerated treatment well;Treatment limited secondary to medical complications (Comment)(increased HR )   Patient Left in bed;with call bell/phone within reach;with bed alarm set   Nurse Communication Mobility status        Time: 1541-1601 OT Time Calculation (min): 20 min  Charges: OT General Charges $OT Visit: 1 Visit OT Treatments $Therapeutic Activity: 8-22 mins  Marcy SirenBreanna Stehanie Ekstrom, OT Pager [phone] 11/22/2017    Orlando PennerBreanna L Josede Cicero 11/22/2017, 5:00 PM

## 2017-11-22 NOTE — NC FL2 (Addendum)
[place] MEDICAID FL2 LEVEL OF CARE SCREENING TOOL     IDENTIFICATION  Patient Name: Richard Powers III Birthdate: Feb 03, 1946 Sex: male Admission Date (Current Location): 11/16/2017  [facility] and [place] Number:  Producer, television/film/video and Address:  [facility] [place], [address], [place], [place] [address]      Provider Number: [id number]  Attending Physician Name and Address:  Barnetta Chapel, MD  Relative Name and Phone Number:  Angelique Blonder sister, [phone]    Current Level of Care: Hospital Recommended Level of Care: Skilled Nursing Facility Prior Approval Number:    Date Approved/Denied:   PASRR Number:   [id number]  Discharge Plan: SNF    Current Diagnoses: Patient Active Problem List   Diagnosis Date Noted  . Dehydration 11/16/2017  . AKI (acute kidney injury) (HCC) 11/16/2017  . Acute encephalopathy 11/16/2017  . Elevated lactic acid level 11/16/2017  . Glaucoma 10/17/2017  . Dementia 09/15/2017  . Sepsis (HCC) 09/09/2017  . Essential hypertension 09/09/2017  . DM II (diabetes mellitus, type II), controlled (HCC) 09/09/2017    Orientation RESPIRATION BLADDER Height & Weight     Self, Place  Normal Incontinent, External catheter Weight: 75.9 kg (167 lb 5.3 oz) Height:     BEHAVIORAL SYMPTOMS/MOOD NEUROLOGICAL BOWEL NUTRITION STATUS  (Has Dementia)   Continent Diet(Please see DC Summary)  AMBULATORY STATUS COMMUNICATION OF NEEDS Skin   Limited Assist Verbally Normal                       Personal Care Assistance Level of Assistance  Bathing, Feeding, Dressing Bathing Assistance: Maximum assistance Feeding assistance: Limited assistance Dressing Assistance: Limited assistance     Functional Limitations Info             SPECIAL CARE FACTORS FREQUENCY  PT (By licensed PT), OT (By licensed OT)     PT Frequency: 5x/week OT Frequency: 3x/week            Contractures      Additional Factors Info   Code Status, Allergies, Insulin Sliding Scale Code Status Info: Full Allergies Info: NKA   Insulin Sliding Scale Info: Every 4 hours       Current Medications (11/22/2017):  This is the current hospital active medication list Current Facility-Administered Medications  Medication Dose Route Frequency Provider Last Rate Last Dose  . acetaminophen (TYLENOL) tablet 650 mg  650 mg Oral Q6H PRN Therisa Doyne, MD   650 mg at 11/18/17 0816   Or  . acetaminophen (TYLENOL) suppository 650 mg  650 mg Rectal Q6H PRN Doutova, Anastassia, MD      . albuterol (PROVENTIL) (2.5 MG/3ML) 0.083% nebulizer solution 2.5 mg  2.5 mg Nebulization Q2H PRN Doutova, Anastassia, MD      . dextrose 5 % and 0.45 % NaCl with KCl 40 mEq/L infusion   Intravenous Continuous Berton Mount I, MD 75 mL/hr at 11/21/17 1515    . donepezil (ARICEPT) tablet 10 mg  10 mg Oral QHS Therisa Doyne, MD   10 mg at 11/21/17 2123  . enoxaparin (LOVENOX) injection 40 mg  40 mg Subcutaneous Daily Hammons, Kimberly B, RPH   40 mg at 11/22/17 0930  . feeding supplement (ENSURE ENLIVE) (ENSURE ENLIVE) liquid 237 mL  237 mL Oral BID BM Briant Cedar, MD   237 mL at 11/22/17 0930  . guaiFENesin (MUCINEX) 12 hr tablet 600 mg  600 mg Oral BID Therisa Doyne, MD   600  mg at 11/22/17 0930  . HYDROcodone-acetaminophen (NORCO/VICODIN) 5-325 MG per tablet 1-2 tablet  1-2 tablet Oral Q4H PRN Doutova, Anastassia, MD      . insulin aspart (novoLOG) injection 0-9 Units  0-9 Units Subcutaneous Q4H Therisa Doyneoutova, Anastassia, MD   1 Units at 11/22/17 0927  . ketotifen (ZADITOR) 0.025 % ophthalmic solution 1 drop  1 drop Both Eyes BID Therisa Doyneoutova, Anastassia, MD   1 drop at 11/22/17 0936  . megestrol (MEGACE) 400 MG/10ML suspension 400 mg  400 mg Oral BID Berton Mountgbata, Sylvester I, MD      . ondansetron [facility](ZOFRAN) tablet 4 mg  4 mg Oral Q6H PRN Therisa Doyneoutova, Anastassia, MD       Or  . ondansetron (ZOFRAN) injection 4 mg  4 mg Intravenous Q6H PRN Doutova,  Anastassia, MD      . phosphorus (K PHOS NEUTRAL) tablet 500 mg  500 mg Oral TID Berton Mountgbata, Sylvester I, MD   500 mg at 11/22/17 0930  . polyvinyl alcohol (LIQUIFILM TEARS) 1.4 % ophthalmic solution 1 drop  1 drop Both Eyes PRN Briant CedarEzenduka, Nkeiruka J, MD      . simvastatin (ZOCOR) tablet 40 mg  40 mg Oral Daily Doutova, Anastassia, MD   40 mg at 11/22/17 0930  . thiamine (VITAMIN B-1) tablet 100 mg  100 mg Oral Daily Briant CedarEzenduka, Nkeiruka J, MD   100 mg at 11/22/17 0930     Discharge Medications: Please see discharge summary for a list of discharge medications.  Relevant Imaging Results:  Relevant Lab Results:   Additional Information SSN: [id number]      In custody of [facility] [place] [employer] due to trespassing but they are going to drop the charges due to Dementia  Mearl LatinNadia S Leiani Enright, LCSWA

## 2017-11-23 ENCOUNTER — Telehealth: Payer: Self-pay

## 2017-11-23 LAB — BASIC METABOLIC PANEL
Anion gap: 13 (ref 5–15)
BUN: 14 mg/dL (ref 6–20)
CO2: 22 mmol/L (ref 22–32)
CREATININE: 1.32 mg/dL — AB (ref 0.61–1.24)
Calcium: 8.6 mg/dL — ABNORMAL LOW (ref 8.9–10.3)
Chloride: 110 mmol/L (ref 101–111)
GFR calc Af Amer: >60 mL/min (ref >60)
GFR calc non Af Amer: 53 mL/min — ABNORMAL LOW (ref >60)
Glucose, Bld: 126 mg/dL — ABNORMAL HIGH (ref 65–99)
Potassium: 3.3 mmol/L — ABNORMAL LOW (ref 3.5–5.1)
SODIUM: 145 mmol/L (ref 135–145)

## 2017-11-23 LAB — GLUCOSE, CAPILLARY
GLUCOSE-CAPILLARY: 209 mg/dL — AB (ref 65–99)
GLUCOSE-CAPILLARY: 223 mg/dL — AB (ref 65–99)
Glucose-Capillary: 100 mg/dL — ABNORMAL HIGH (ref 65–99)
Glucose-Capillary: 169 mg/dL — ABNORMAL HIGH (ref 65–99)

## 2017-11-23 MED ORDER — POTASSIUM CHLORIDE CRYS ER 20 MEQ PO TBCR
40.0000 meq | EXTENDED_RELEASE_TABLET | Freq: Two times a day (BID) | ORAL | Status: DC
  Administered 2017-11-23 – 2017-11-25 (×4): 40 meq via ORAL
  Filled 2017-11-23 (×4): qty 2

## 2017-11-23 NOTE — Progress Notes (Signed)
Calorie Count Note  48 hour calorie count ordered.  Diet: Carb Modified Supplements:  Ensure Enlive po BID, each supplement provides 350 kcal and 20 grams of protein  Breakfast: 245 calories, 10 grams of protein Lunch: No reciept Dinner: No reciept Supplements:  Not noted  Total intake: 245 kcal (14% of minimum estimated needs)  10 grams of protein (12% of minimum estimated needs)  Nutrition Dx: Inadequate Oral Intake  Goal: Patient will meet >90% of estimated needs  Intervention: Continue Ensure Enlive po BID, each supplement provides 350 kcal and 20 grams of protein RD to monitor for further reciepts.  Dionne AnoWilliam M. Dniya Neuhaus, MS, RD LDN Inpatient Clinical Dietitian Pager [phone]

## 2017-11-23 NOTE — Telephone Encounter (Signed)
[facility] As per Gae GallopAngela Cole, RN CM they are seeking placement for the patient. She explained that the charges against the patient have been dropped and he will not be going home with his niece.   Update provided to [place][name], RN/[place] [place].

## 2017-11-23 NOTE — Progress Notes (Signed)
Physical Therapy Treatment Patient Details Name: Richard Powers MRN: [id number] DOB: 1945-11-14 Today's Date: 11/23/2017    History of Present Illness Richard Powers is a 72 y.o. male admitted from jail with medical history significant of Dementia, DM 2, HTN. Found to have Acute encephalopathy, Dehydration and AKI. During admission charges against pt have been lifted and pt is once again homeless    PT Comments    Pt requires maximal encouragement to work with PT today and perseverates on having to call his sister. Pt is currently minA for bed mobility and transfers and minA progressing to modA with fatigue, for ambulation of 16 feet with RW. At this time PT recommending SNF level care at rehab to progress strength and balance and for DME training. PT will continue to follow acutely until d/c.    Follow Up Recommendations  SNF     Equipment Recommendations  (TBD)    Recommendations for Other Services       Precautions / Restrictions Precautions Precautions: Fall;Other (comment) Precaution Comments: can become agitated especially when fatigued  Restrictions Weight Bearing Restrictions: No    Mobility  Bed Mobility Overal bed mobility: Needs Assistance Bed Mobility: Supine to Sit     Supine to sit: Min assist Sit to supine: Min assist   General bed mobility comments: minA for management of LE in/out of bed  Transfers Overall transfer level: Needs assistance Equipment used: Rolling walker (2 wheeled) Transfers: Sit to/from Stand Sit to Stand: Min assist;From elevated surface         General transfer comment: minA for power up and steadying with RW, vc for hand placement  Ambulation/Gait Ambulation/Gait assistance: Min assist;Mod assist Ambulation Distance (Feet): 16 Feet Assistive device: Rolling walker (2 wheeled) Gait Pattern/deviations: Step-through pattern;Decreased step length - right;Decreased step length - left;Decreased stride  length;Trunk flexed Gait velocity: slowed Gait velocity interpretation: Below normal speed for age/gender General Gait Details: minA progressing to modA as pt fatigued, decreased awareness of RW use, vc for proximity and clearing obstacles, after reaching the door pt started to curse and turned around to walk back to bed.       Balance Overall balance assessment: Needs assistance Sitting-balance support: No upper extremity supported;Feet supported Sitting balance-Leahy Scale: Good     Standing balance support: No upper extremity supported Standing balance-Leahy Scale: Poor                              Cognition Arousal/Alertness: Awake/alert Behavior During Therapy: WFL for tasks assessed/performed Overall Cognitive Status: No family/caregiver present to determine baseline cognitive functioning Area of Impairment: Orientation;Problem solving;Safety/judgement                 Orientation Level: Person;Place;Situation       Safety/Judgement: Decreased awareness of safety;Decreased awareness of deficits   Problem Solving: Requires verbal cues General Comments: perseverated on having to call his sister throughout session      Exercises      General Comments General comments (skin integrity, edema, etc.): HR max 105 bpm with activity today      Pertinent Vitals/Pain Pain Assessment: No/denies pain Faces Pain Scale: No hurt           PT Goals (current goals can now be found in the care plan section) Acute Rehab PT Goals Patient Stated Goal: to be warm  PT Goal Formulation: With patient Progress towards PT goals: Not progressing toward goals -  comment(difficulty focusing pt efforts today, progression limited)    Frequency    Min 3X/week      PT Plan Discharge plan needs to be updated       AM-PAC PT "6 Clicks" Daily Activity  Outcome Measure  Difficulty turning over in bed (including adjusting bedclothes, sheets and blankets)?:  Unable Difficulty moving from lying on back to sitting on the side of the bed? : Unable Difficulty sitting down on and standing up from a chair with arms (e.g., wheelchair, bedside commode, etc,.)?: Unable Help needed moving to and from a bed to chair (including a wheelchair)?: A Little Help needed walking in hospital room?: A Little Help needed climbing 3-5 steps with a railing? : A Lot 6 Click Score: 11    End of Session Equipment Utilized During Treatment: Gait belt Activity Tolerance: Patient tolerated treatment well Patient left: with bed alarm set;in bed;with call bell/phone within reach;with family/visitor present(law enforcement officer present) Nurse Communication: Mobility status PT Visit Diagnosis: Unsteadiness on feet (R26.81);Muscle weakness (generalized) (M62.81);Difficulty in walking, not elsewhere classified (R26.2)     Time: [id number] PT Time Calculation (min) (ACUTE ONLY): 14 min  Charges:  $Gait Training: 8-22 mins                    G Codes:       Smrithi Pigford B. Beverely RisenVan Fleet PT, DPT Acute Rehabilitation  [phone] Pager [phone]) [phone]     Elon Alaslizabeth B Van Fleet 11/23/2017, 4:18 PM

## 2017-11-23 NOTE — Progress Notes (Signed)
PROGRESS NOTE  Michele RockersWilliam Thomas MadrasXXXBrown III MVH:[id number] DOB: 1945-12-11 DOA: 11/16/2017 PCP: [name], [name], MD  HPI/Recap of past 24 hours: [age] male documented dementia, DM type 2, and hypertension.  Patient was admitted from jail, presented to the ED with acute kidney injury noted after labs on 3/18 were obtained from jail showed elevated creatinine.  Patient was reported to have significantly poor p.o. intake while in jail.  On presentation to the the ED, patient was noted to be hypotensive, tachycardic, with elevated lactic acid.   Patient was admitted for further assessment and management.  Initially, patient was aggressively hydrated, and started on empiric antibiotics.  Acute kidney injury has continued to improve.  Acute encephalopathy seems to have improved significantly.   There were concerns for moderate dementing illness.  significant amount of acute encephalopathy in patient's psychopathology He has underlying dementia which is clearing and worsened by his uremia  Assessment/Plan: Principal Problem:   Dementia Active Problems:   Essential hypertension   DM II (diabetes mellitus, type II), controlled (HCC)   Dehydration   AKI (acute kidney injury) (HCC)   Acute encephalopathy   Elevated lactic acid level   SUBJECTIVE Awake alert pleasant in nad but occ confabulates and gets lost in track of thought On discussion with sister-doesn't seem to be outside of his norm    Failure to thrive in the setting of dementia Refusing to eat food/drink, generalized weakness Currently afebrile, no leukocytosis, improved BP s/p IVF Hypotensive, tachycardic, elevated LA on admission Unlikely sepsis, likely due to severe dehydration Lactic acid trended down to normal, s/p 2.5L of IVF Pro-calcitonin negative UA negative for infection, urine culture no growth Blood culture no growth till date Chest x-ray negative--discontinue empiric IV Zosyn plus vancomycin Changed IVF to D5W due to  hypernatremia--will saline lock today and observe labs am Dietitian consult placed, advised RN to give patient Ensure 3 times daily  AKI: Improved significantly.  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 -Use Eucerin or other moisturizing cream. -Continue to assess for possible secondary fungal infection.  Code Status: Full  Family Communication: Niece  Disposition Plan: plans for SNF in am   Consultants:  None  Procedures:  None  Antimicrobials:  S/P IV vancomycin plus Zosyn  DVT prophylaxis: Lovenox  Objective: Vitals:   11/22/17 2120 11/23/17 0443 11/23/17 0729 11/23/17 1218  BP: 124/76 116/68 128/70 110/60  Pulse: 92 78 68 76  Resp: 18 18 18 20   Temp: 98.2 F (36.8 C) 98.1 F (36.7 C) 99.4 F (37.4 C) 99.3 F (37.4 C)  TempSrc: Oral Oral Oral Oral  SpO2: 99% 95% 97% 98%  Weight:        Intake/Output Summary (Last 24 hours) at 11/23/2017 1722 Last data filed at 11/23/2017 1400 Gross per 24 hour  Intake 597 ml  Output 700 ml  Net -103 ml   Filed Weights   11/16/17 1401 11/22/17 0504  Weight: 68.9 kg (  152 lb) 75.9 kg (167 lb 5.3 oz)    Exam:   General:awake alert in nad  Cardiovascular: S1, S2 present, no m/r/g  Respiratory: CTAB  Abdomen: Soft, nontender, nondistended, bowel sounds present  Musculoskeletal: No pedal edema  bilaterally  Skin: Significant exfoliation both feet.       Data Reviewed: CBC: Recent Labs  Lab 11/18/17 0534 11/19/17 0552 11/20/17 0624 11/21/17 0737 11/22/17 0613  WBC 6.0 6.0 5.5 4.7 4.9  NEUTROABS 4.4 3.7 3.3 2.4 2.7  HGB 13.3 10.8* 10.4* 10.7* 10.3*  HCT 42.5 33.3* 32.0* 32.7* 31.6*  MCV 92.6 92.0 89.1 90.3 87.8  PLT 139* 123* 118* 101* 119*   Basic Metabolic Panel: Recent Labs  Lab 11/17/17 0819  11/19/17 0552 11/20/17 0624 11/21/17 0737 11/22/17 0613 11/23/17 1005  NA  --    < > 148* 145 142 143 145  K  --    < > 3.6 3.1* 3.6 3.8 3.3*  CL  --    < > 114* 114* 114* 111 110  CO2  --    < > 21* 24 23 24 22   GLUCOSE  --    < > 152* 170* 132* 135* 126*  BUN  --    < > 41* 24* 15 12 14   CREATININE  --    < > 2.02* 1.66* 1.45* 1.37* 1.32*  CALCIUM  --    < > 8.3* 8.4* 8.2* 8.1* 8.6*  MG 2.5*  --   --   --   --   --   --   PHOS 4.7*  --   --   --  1.7*  --   --    < > = values in this interval not displayed.   GFR: Estimated Creatinine Clearance: 48 mL/min (A) (by C-G formula based on SCr of 1.32 mg/dL (H)). Liver Function Tests: Recent Labs  Lab 11/17/17 0133 11/21/17 0737  AST 22  --   ALT 15*  --   ALKPHOS 31*  --   BILITOT 1.9*  --   PROT 6.2*  --   ALBUMIN 3.2* 2.6*   No results for input(s): LIPASE, AMYLASE in the last 168 hours. No results for input(s): AMMONIA in the last 168 hours. Coagulation Profile: Recent Labs  Lab 11/16/17 2205  INR 1.17   Cardiac Enzymes: Recent Labs  Lab 11/16/17 2205 11/17/17 0133 11/17/17 0819  CKTOTAL 59  --   --   TROPONINI <0.03 <0.03 <0.03   BNP (last 3 results) No results for input(s): PROBNP in the last 8760 hours. HbA1C: No results for input(s): HGBA1C in the last 72 hours. CBG: Recent Labs  Lab 11/22/17 2022 11/23/17 0006 11/23/17 0441 11/23/17 1217 11/23/17 1625  GLUCAP 262* 209* 100* 223* 169*   Lipid Profile: No results for input(s): CHOL, HDL, LDLCALC, TRIG, CHOLHDL, LDLDIRECT in  the last 72 hours. Thyroid Function Tests: No results for input(s): TSH, T4TOTAL, FREET4, T3FREE, THYROIDAB in the last 72 hours. Anemia Panel: No results for input(s): VITAMINB12, FOLATE, FERRITIN, TIBC, IRON, RETICCTPCT in the last 72 hours. Urine analysis:    Component Value Date/Time   COLORURINE YELLOW 11/16/2017 1630   APPEARANCEUR CLEAR 11/16/2017 1630   LABSPEC 1.021 11/16/2017 1630   PHURINE 5.0 11/16/2017 1630   GLUCOSEU NEGATIVE 11/16/2017 1630   HGBUR NEGATIVE 11/16/2017 1630   BILIRUBINUR NEGATIVE 11/16/2017 1630   KETONESUR 20 (A) 11/16/2017 1630   PROTEINUR NEGATIVE 11/16/2017 1630   UROBILINOGEN 0.2 02/22/2015 2331   NITRITE  NEGATIVE 11/16/2017 1630   LEUKOCYTESUR NEGATIVE 11/16/2017 1630   Sepsis Labs: @LABRCNTIP (procalcitonin:4,lacticidven:4)  ) Recent Results (from the past 240 hour(s))  Blood Culture (routine x 2)     Status: None   Collection Time: 11/16/17  1:56 PM  Result Value Ref Range Status   Specimen Description BLOOD LEFT ANTECUBITAL  Final   Special Requests   Final    BOTTLES DRAWN AEROBIC ONLY Blood Culture results may not be optimal due to an inadequate volume of blood received in culture bottles   Culture   Final    NO GROWTH 5 DAYS Performed at [facility], [address], [place], [place] [address]    Report Status 11/21/2017 FINAL  Final  Urine culture     Status: None   Collection Time: 11/16/17  4:30 PM  Result Value Ref Range Status   Specimen Description URINE, RANDOM  Final   Special Requests NONE  Final   Culture   Final    NO GROWTH Performed at [facility], [address], [place], [place] [address]    Report Status 11/17/2017 FINAL  Final  Culture, blood (Routine X 2) w Reflex to ID Panel     Status: None   Collection Time: 11/16/17 10:00 PM  Result Value Ref Range Status   Specimen Description BLOOD RIGHT HAND  Final   Special Requests IN PEDIATRIC BOTTLE Blood Culture adequate volume  Final   Culture    Final    NO GROWTH 5 DAYS Performed at [facility], [address] [place], [place] [address]    Report Status 11/21/2017 FINAL  Final  MRSA PCR Screening     Status: None   Collection Time: 11/17/17 12:48 AM  Result Value Ref Range Status   MRSA by PCR NEGATIVE NEGATIVE Final    Comment:        The GeneXpert MRSA Assay (FDA approved for NASAL specimens only), is one component of a comprehensive MRSA colonization surveillance program. It is not intended to diagnose MRSA infection nor to guide or monitor treatment for MRSA infections. Performed at [facility], [address], [place], [place] [address]       Studies: No results found.  Scheduled Meds: . donepezil  10 mg Oral QHS  . enoxaparin (LOVENOX) injection  40 mg Subcutaneous Daily  . feeding supplement (ENSURE ENLIVE)  237 mL Oral BID BM  . guaiFENesin  600 mg Oral BID  . hydrocerin   Topical BID  . insulin aspart  0-9 Units Subcutaneous Q4H  . ketotifen  1 drop Both Eyes BID  . megestrol  400 mg Oral BID  . simvastatin  40 mg Oral Daily  . thiamine  100 mg Oral Daily    Continuous Infusions: . dextrose 5 % and 0.45 % NaCl with KCl 40 mEq/L 75 mL/hr at 11/21/17 1515     LOS: 7 days    Pleas Koch, MD [facility] (P) [phone]   If 7PM-7AM, please contact night-coverage [email or web address] Password [facility] 11/23/2017, 5:22 PM

## 2017-11-24 LAB — BASIC METABOLIC PANEL
Anion gap: 9 (ref 5–15)
BUN: 20 mg/dL (ref 6–20)
CALCIUM: 8.5 mg/dL — AB (ref 8.9–10.3)
CO2: 25 mmol/L (ref 22–32)
CREATININE: 1.38 mg/dL — AB (ref 0.61–1.24)
Chloride: 109 mmol/L (ref 101–111)
GFR calc Af Amer: 58 mL/min — ABNORMAL LOW (ref >60)
GFR calc non Af Amer: 50 mL/min — ABNORMAL LOW (ref >60)
GLUCOSE: 174 mg/dL — AB (ref 65–99)
Potassium: 4.2 mmol/L (ref 3.5–5.1)
Sodium: 143 mmol/L (ref 135–145)

## 2017-11-24 LAB — CBC WITH DIFFERENTIAL/PLATELET
BASOS ABS: 0 10*3/uL (ref 0.0–0.1)
Basophils Relative: 0 %
Eosinophils Absolute: 0.2 10*3/uL (ref 0.0–0.7)
Eosinophils Relative: 3 %
HEMATOCRIT: 31.6 % — AB (ref 39.0–52.0)
HEMOGLOBIN: 10.4 g/dL — AB (ref 13.0–17.0)
LYMPHS PCT: 32 %
Lymphs Abs: 1.6 10*3/uL (ref 0.7–4.0)
MCH: 29.8 pg (ref 26.0–34.0)
MCHC: 32.9 g/dL (ref 30.0–36.0)
MCV: 90.5 fL (ref 78.0–100.0)
MONO ABS: 0.3 10*3/uL (ref 0.1–1.0)
Monocytes Relative: 7 %
NEUTROS ABS: 2.9 10*3/uL (ref 1.7–7.7)
Neutrophils Relative %: 58 %
Platelets: 145 10*3/uL — ABNORMAL LOW (ref 150–400)
RBC: 3.49 MIL/uL — AB (ref 4.22–5.81)
RDW: 13.8 % (ref 11.5–15.5)
WBC: 5 10*3/uL (ref 4.0–10.5)

## 2017-11-24 LAB — GLUCOSE, CAPILLARY
GLUCOSE-CAPILLARY: 178 mg/dL — AB (ref 65–99)
GLUCOSE-CAPILLARY: 106 mg/dL — AB (ref 65–99)
GLUCOSE-CAPILLARY: 273 mg/dL — AB (ref 65–99)
Glucose-Capillary: 123 mg/dL — ABNORMAL HIGH (ref 65–99)
Glucose-Capillary: 156 mg/dL — ABNORMAL HIGH (ref 65–99)
Glucose-Capillary: 173 mg/dL — ABNORMAL HIGH (ref 65–99)
Glucose-Capillary: 257 mg/dL — ABNORMAL HIGH (ref 65–99)

## 2017-11-24 LAB — PHOSPHORUS: Phosphorus: 2.3 mg/dL — ABNORMAL LOW (ref 2.5–4.6)

## 2017-11-24 MED ORDER — THIAMINE HCL 100 MG PO TABS: 100.0000 mg | ORAL_TABLET | Freq: Every day | ORAL | Status: DC

## 2017-11-24 MED ORDER — ENSURE ENLIVE PO LIQD: 237.0000 mL | Freq: Two times a day (BID) | ORAL | 12 refills | Status: DC

## 2017-11-24 MED ORDER — MEGESTROL ACETATE 400 MG/10ML PO SUSP: 400.0000 mg | Freq: Two times a day (BID) | ORAL | 0 refills | Status: DC

## 2017-11-24 MED ORDER — DONEPEZIL HCL 10 MG PO TABS: 10.0000 mg | ORAL_TABLET | Freq: Every day | ORAL | 3 refills | Status: DC

## 2017-11-24 MED ORDER — HYDROCERIN EX CREA: 1.0000 "application " | TOPICAL_CREAM | Freq: Two times a day (BID) | CUTANEOUS | 0 refills | Status: DC

## 2017-11-24 MED ORDER — POLYVINYL ALCOHOL 1.4 % OP SOLN: 1.0000 [drp] | OPHTHALMIC | 0 refills | Status: AC | PRN

## 2017-11-24 NOTE — Clinical Social Work Note (Signed)
Clinical Social Work Assessment  Patient Details  Name: Richard Powers MRN: [id number] Date of Birth: 1946/08/21  Date of referral:  11/24/17               Reason for consult:  Facility Placement                Permission sought to share information with:  Facility Medical sales representativeContact Representative Permission granted to share information::  Yes, Verbal Permission Granted  Name::     Web designerDenise  Agency::  SNFs  Relationship::  Niece  Contact Information:  [phone]  Housing/Transportation Living arrangements for the past 2 months:  Homeless Shelter Source of Information:  Other (Comment Required)(Niece) Patient Interpreter Needed:  None Criminal Activity/Legal Involvement Pertinent to Current Situation/Hospitalization:  No - Comment as needed Significant Relationships:  Siblings, Other Family Members Lives with:  Self Do you feel safe going back to the place where you live?  No Need for family participation in patient care:  Yes (Comment)  Care giving concerns:  CSW received consult for possible SNF placement at time of discharge. CSW spoke with patient's niece, [name], regarding PT recommendation of SNF placement at time of discharge. [name] reported that patient was homeless before he was taken to jail for trespassing. She is unable to have the patient stay with her due to not having room. [name] expressed understanding of PT recommendation and is agreeable to SNF placement at time of discharge. CSW to continue to follow and assist with discharge planning needs.   Social Worker assessment / plan:  CSW spoke with patient's neice [facility]  Employment status:  Disabled (Comment on whether or not currently receiving Disability), Retired Database administratornsurance information:  [place], [place] PT Recommendations:  Skilled Nursing Facility Information / Referral to community resources:  Skilled Nursing Facility  Patient/Family's  Response to care:  [name] recognizes need for rehab and possible long term placement and [place] agree[place]. She reports understanding that the only [facility] She will complete patient's paperwork tomorrow morning. He will require PTAR for transport. She is hopeful that patient will stay at the facility and not try to leave due to his wanting independence.   Patient/Family's Understanding of and Emotional Response to Diagnosis, Current Treatment, and Prognosis:  Patient/family is realistic regarding therapy needs and expressed being hopeful for SNF placement. [name] expressed understanding of CSW role and discharge process as well as medical condition. No questions/concerns about plan or treatment.    Emotional Assessment Appearance:  Appears stated age Attitude/Demeanor/Rapport:  Unable to Assess Affect (typically observed):  Unable to Assess Orientation:  Oriented to Self, Oriented to Place Alcohol / Substance use:  Not Applicable Psych involvement (Current and /or in the community):  No (Comment)  Discharge Needs  Concerns to be addressed:  Care Coordination Readmission within the last 30 days:  No Current discharge risk:  Cognitively Impaired, Homeless Barriers to Discharge:  No Barriers Identified   Mearl Latinadia S Tevion Laforge, LCSWA 11/24/2017, 3:25 PM

## 2017-11-24 NOTE — Discharge Summary (Signed)
Physician Discharge Summary  Richard RockersWilliam Thomas Green RiverXXXBrown Powers ZOX:[id number] DOB: [birth year] DOA: 11/16/2017  PCP: [name], [name], MD  Admit date: 11/16/2017 Discharge date: 11/24/2017  Time spent: 35 minutes  Recommendations for Outpatient Follow-uP 1. patient will need a basic metabolic panel and CBC in 1 month 2. Would recommend outpatient Geri psychiatry evaluation 3. Medication changes this admission include discontinuation of saxagliptin lisinopril HCTZ-please consider reinitiation of antihypertensive medications provided patient blood pressures come up   Discharge Diagnoses:  Principal Problem:   Dementia Active Problems:   Essential hypertension   DM II (diabetes mellitus, type II), controlled (HCC)   Dehydration   AKI (acute kidney injury) (HCC)   Acute encephalopathy   Elevated lactic acid level   Discharge Condition: Improved  Diet recommendation: Heart healthy low-salt  Filed Weights   11/16/17 1401 11/22/17 0504  Weight: 68.9 kg (152 lb) 75.9 kg (167 lb 5.3 oz)    History of present illness:  [age] male documented dementia, DM type 2, and hypertension.  Patient was admitted from jail, presented to the ED with acute kidney injury noted after labs on 3/18 were obtained from jail showed elevated creatinine.  Patient was reported to have significantly poor p.o. intake while in jail.  On presentation to the the ED, patient was noted to be hypotensive, tachycardic, with elevated lactic acid.   Patient was admitted for further assessment and management.  Initially, patient was aggressively hydrated, and started on empiric antibiotics.  Acute kidney injury has continued to improve.  Acute encephalopathy seems to have improved significantly.   There were concerns for moderate dementing illness.  significant amount of acute encephalopathy in patient's psychopathology He has underlying dementia which is clearing and worsened by his uremia    Hospital Course:   Failure to thrive  in the setting of dementia Refusing to eat food/drink, generalized weakness Currently afebrile, no leukocytosis, improved BP s/p IVF Hypotensive, tachycardic, elevated LA on admission due to severe dehydration Lactic acid trended down to normal, s/p 2.5L of IVF Pro-calcitonin negative UA negative for infection, urine culture no growth Blood culture no growth till date Chest x-ray negative--discontinue empiric IV Zosyn plus vancomycin Changed IVF to D5W due to hypernatremia-- saline lock and labs improved on discharge Dietitian consult placed, advised RN to give patient Ensure 3 times daily  AKI: Improved significantly due to dehydration Renal ultrasound normalStrict I's and O's, avoid nephrotoxins, hold home lisinopril + HCT  and consider not using in the future  Hypernatremia, resolved: D/c saline 3/27  Elevated d-dimer D-dimer 7.02 VQ scan negative for PE  Type 2 diabetes mellitus A1c 9.6 Sensitive SSI, Accu-Cheks Held home metformin, saxagliptin, insulin regular--sugars controlled during hospital stay On discharge resumed metformin and insulin 3 times daily 8 units  Hypertension Stable and controlled Held lisinopril + HCT due to AKI + hypotension on admission  Possible dementia:  continue Aricept for now, is at baseline and will need outpatient psychiatry involvement  Exfoliative skin lesions plantar surface of both feet: -Soak both feet daily. -Use Eucerin or other moisturizing cream.    Moderate malnutrition Calorie count was in place prior to discharge which can be reassessed as an outpatient Phosphorus is slightly low and should improve slowly started on megestrol this admission  Awake alert Discharge Exam: Vitals:   11/23/17 2059 11/24/17 0432  BP: 122/69 101/61  Pulse: 72 67  Resp: 12 12  Temp: 99.3 F (37.4 C) 99 F (37.2 C)  SpO2: 99% 98%    General: Oriented but  in no distress, no fever no chills no nausea no vomiting Cardiovascular: S1-S2  no murmur rub or gallop Respiratory: Clinically clear no added sounds no rales no rhonchi Neurologically intact moving all 4 limbs equally Lower extremities show scale and removal of dead skin but no fungal-like infection or lesion or ulcer and pulses are intact  Discharge Instructions   Discharge Instructions    Diet - low sodium heart healthy   Complete by:  As directed    Increase activity slowly   Complete by:  As directed      Allergies as of 11/24/2017   No Known Allergies     Medication List    STOP taking these medications   hydrochlorothiazide 25 MG tablet Commonly known as:  HYDRODIURIL   lisinopril 40 MG tablet Commonly known as:  PRINIVIL,ZESTRIL     TAKE these medications   acetaminophen 500 MG tablet Commonly known as:  TYLENOL Take 1 tablet (500 mg total) by mouth every 6 (six) hours as needed.   donepezil 10 MG tablet Commonly known as:  ARICEPT Take 1 tablet (10 mg total) by mouth at bedtime.   feeding supplement (ENSURE ENLIVE) Liqd Take 237 mLs by mouth 2 (two) times daily between meals.   hydrocerin Crea Apply 1 application topically 2 (two) times daily.   insulin regular 100 units/mL injection Commonly known as:  NOVOLIN R,HUMULIN R Inject into the skin 3 (three) times daily before meals. Per sliding scale   megestrol 400 MG/10ML suspension Commonly known as:  MEGACE Take 10 mLs (400 mg total) by mouth 2 (two) times daily.   metFORMIN 500 MG tablet Commonly known as:  GLUCOPHAGE Take 500 mg by mouth 2 (two) times daily with a meal.   polyvinyl alcohol 1.4 % ophthalmic solution Commonly known as:  LIQUIFILM TEARS Place 1 drop into both eyes as needed for dry eyes.   simvastatin 40 MG tablet Commonly known as:  ZOCOR Take 1 tablet (40 mg total) by mouth daily. What changed:  when to take this   thiamine 100 MG tablet Take 1 tablet (100 mg total) by mouth daily.      No Known Allergies    The results of significant diagnostics  from this hospitalization (including imaging, microbiology, ancillary and laboratory) are listed below for reference.    Significant Diagnostic Studies: Dg Chest 2 View  Result Date: 11/16/2017 CLINICAL DATA:  Altered mental status. EXAM: CHEST - 2 VIEW COMPARISON:  PA and lateral chest 10/20/2017 and 09/12/2017. CT chest 02/23/2015. FINDINGS: Lungs are clear. Heart size is normal. No pneumothorax or pleural effusion. Remote left rib fractures noted. IMPRESSION: No acute disease. Electronically Signed   By: Richard Powers M.D.   On: 11/16/2017 17:14   US Renal  Result Date: 11/16/2017 CLINICAL DATA:  Acute renal failure EXAM: RENAL / URINARY TRACT ULTRASOUND COMPLETE COMPARISON:  CT 02/22/2015 FINDINGS: Right Kidney: Length: 9.1 cm. Echogenicity within normal limits. No mass or hydronephrosis visualized. Left Kidney: Length: 8.6 cm. Echogenicity within normal limits. No mass or hydronephrosis visualized. Bladder: Appears normal for degree of bladder distention. IMPRESSION: Negative renal ultrasound Electronically Signed   By: Richard Powers M.D.   On: 11/16/2017 23:21   Nm Pulmonary Perf And Vent  Result Date: 11/17/2017 CLINICAL DATA:  Recurrent dyspnea.  Positive D-dimer. EXAM: NUCLEAR MEDICINE VENTILATION - PERFUSION LUNG SCAN TECHNIQUE: Ventilation images were obtained in multiple projections using inhaled aerosol Tc-48m DTPA. Perfusion images were obtained in multiple projections after intravenous injection of  Tc-68m-MAA. RADIOPHARMACEUTICALS:  32.3 mCi of Tc-78m DTPA aerosol inhalation and 4.27 mCi Tc30m-MAA IV COMPARISON:  Chest x-ray dated 11/16/2017 FINDINGS: Ventilation: Normal. Perfusion: No wedge shaped peripheral perfusion defects to suggest acute pulmonary embolism. Normal perfusion bilaterally. IMPRESSION: Normal ventilation-perfusion lung scan. No findings suggestive of pulmonary embolism. Electronically Signed   By: Richard Powers M.D.   On: 11/17/2017 13:16   Dg Chest Port 1  View  Result Date: 11/16/2017 CLINICAL DATA:  Dyspnea today. EXAM: PORTABLE CHEST 1 VIEW COMPARISON:  PA and lateral chest 11/16/2017. Single-view of the chest 02/22/2015. CT chest 02/23/2015. FINDINGS: The lungs are clear. Heart size is normal. Aortic atherosclerosis is noted. No pneumothorax or pleural effusion. Remote left rib fractures are seen. IMPRESSION: No acute disease. Atherosclerosis. Electronically Signed   By: Richard Powers M.D.   On: 11/16/2017 20:27    Microbiology: Recent Results (from the past 240 hour(s))  Blood Culture (routine x 2)     Status: None   Collection Time: 11/16/17  1:56 PM  Result Value Ref Range Status   Specimen Description BLOOD LEFT ANTECUBITAL  Final   Special Requests   Final    BOTTLES DRAWN AEROBIC ONLY Blood Culture results may not be optimal due to an inadequate volume of blood received in culture bottles   Culture   Final    NO GROWTH 5 DAYS Performed at [facility], [address], [place], [place] [address]    Report Status 11/21/2017 FINAL  Final  Urine culture     Status: None   Collection Time: 11/16/17  4:30 PM  Result Value Ref Range Status   Specimen Description URINE, RANDOM  Final   Special Requests NONE  Final   Culture   Final    NO GROWTH Performed at [facility], [address] [place], [place] [address]    Report Status 11/17/2017 FINAL  Final  Culture, blood (Routine X 2) w Reflex to ID Panel     Status: None   Collection Time: 11/16/17 10:00 PM  Result Value Ref Range Status   Specimen Description BLOOD RIGHT HAND  Final   Special Requests IN PEDIATRIC BOTTLE Blood Culture adequate volume  Final   Culture   Final    NO GROWTH 5 DAYS Performed at [facility], [address], [place], [place] [address]    Report Status 11/21/2017 FINAL  Final  MRSA PCR Screening     Status: None   Collection Time: 11/17/17 12:48 AM  Result Value Ref Range Status   MRSA by PCR NEGATIVE NEGATIVE Final     Comment:        The GeneXpert MRSA Assay (FDA approved for NASAL specimens only), is one component of a comprehensive MRSA colonization surveillance program. It is not intended to diagnose MRSA infection nor to guide or monitor treatment for MRSA infections. Performed at [facility], [address] [place], [place] [address]      Labs: Basic Metabolic Panel: Recent Labs  Lab 11/20/17 [phone] 11/21/17 0737 11/22/17 0613 11/23/17 1005 11/24/17 0445  NA 145 142 143 145 143  K 3.1* 3.6 3.8 3.3* 4.2  CL 114* 114* 111 110 109  CO2 24 23 24 22 25   GLUCOSE 170* 132* 135* 126* 174*  BUN 24* 15 12 14 20   CREATININE 1.66* 1.45* 1.37* 1.32* 1.38*  CALCIUM 8.4* 8.2* 8.1* 8.6* 8.5*  PHOS  --  1.7*  --   --  2.3*  Liver Function Tests: Recent Labs  Lab 11/21/17 0737  ALBUMIN 2.6*   No results for input(s): LIPASE, AMYLASE in the last 168 hours. No results for input(s): AMMONIA in the last 168 hours. CBC: Recent Labs  Lab 11/19/17 0552 11/20/17 0624 11/21/17 0737 11/22/17 0613 11/24/17 0445  WBC 6.0 5.5 4.7 4.9 5.0  NEUTROABS 3.7 3.3 2.4 2.7 2.9  HGB 10.8* 10.4* 10.7* 10.3* 10.4*  HCT 33.3* 32.0* 32.7* 31.6* 31.6*  MCV 92.0 89.1 90.3 87.8 90.5  PLT 123* 118* 101* 119* 145*   Cardiac Enzymes: No results for input(s): CKTOTAL, CKMB, CKMBINDEX, TROPONINI in the last 168 hours. BNP: BNP (last 3 results) No results for input(s): BNP in the last 8760 hours.  ProBNP (last 3 results) No results for input(s): PROBNP in the last 8760 hours.  CBG: Recent Labs  Lab 11/23/17 1625 11/23/17 2057 11/24/17 0005 11/24/17 0430 11/24/17 0756  GLUCAP 169* 257* 156* 173* 123*       Signed:  Rhetta Mura MD   [facility] 11/24/2017, 8:43 AM

## 2017-11-24 NOTE — Progress Notes (Addendum)
3:20pm-[facility] able to accept patient once patient's niece completes admission paperwork. She is able to go to the facility Friday morning (her daughter used to work there). Will send patient by PTAR after that.   2:54pm-[facility]. They are hopeful to accept patient in their locked unit.   Richard Cascoadia Heidy Mccubbin LCSW [phone]

## 2017-11-25 LAB — CBC WITH DIFFERENTIAL/PLATELET
BASOS ABS: 0 10*3/uL (ref 0.0–0.1)
BASOS PCT: 0 %
EOS ABS: 0.2 10*3/uL (ref 0.0–0.7)
EOS PCT: 4 %
HCT: 32.4 % — ABNORMAL LOW (ref 39.0–52.0)
HEMOGLOBIN: 10.6 g/dL — AB (ref 13.0–17.0)
LYMPHS ABS: 1.6 10*3/uL (ref 0.7–4.0)
Lymphocytes Relative: 30 %
MCH: 29.6 pg (ref 26.0–34.0)
MCHC: 32.7 g/dL (ref 30.0–36.0)
MCV: 90.5 fL (ref 78.0–100.0)
Monocytes Absolute: 0.3 10*3/uL (ref 0.1–1.0)
Monocytes Relative: 6 %
NEUTROS PCT: 60 %
Neutro Abs: 3.2 10*3/uL (ref 1.7–7.7)
PLATELETS: 174 10*3/uL (ref 150–400)
RBC: 3.58 MIL/uL — AB (ref 4.22–5.81)
RDW: 13.8 % (ref 11.5–15.5)
WBC: 5.2 10*3/uL (ref 4.0–10.5)

## 2017-11-25 LAB — GLUCOSE, CAPILLARY
GLUCOSE-CAPILLARY: 147 mg/dL — AB (ref 65–99)
GLUCOSE-CAPILLARY: 166 mg/dL — AB (ref 65–99)
GLUCOSE-CAPILLARY: 270 mg/dL — AB (ref 65–99)
Glucose-Capillary: 124 mg/dL — ABNORMAL HIGH (ref 65–99)

## 2017-11-25 NOTE — Progress Notes (Signed)
Patient will DC to: [facility] Anticipated DC date: 11/25/17 Family notified: Niece Transport by: PTAR 1:30pm   Per MD [facility]. RN, patient, patient's family, and facility notified of DC. Discharge Summary sent to facility. RN given number for report [phone]). DC packet on chart. Ambulance transport requested for patient.   CSW signing off.  Cristobal GoldmannNadia Calvina Liptak, LCSW Clinical Social Worker [phone]

## 2017-11-25 NOTE — Clinical Social Work Placement (Signed)
   CLINICAL SOCIAL WORK PLACEMENT  NOTE  Date:  11/25/2017  Patient Details  Name: Richard Powers MRN: [id number] Date of Birth: May 13, 1946  Clinical Social Work is seeking post-discharge placement for this patient at the Skilled  Nursing Facility level of care (*CSW will initial, date and re-position this form in  chart as items are completed):  Yes   Patient/family provided with [facility] Clinical Social Work Department's list of facilities offering this level of care within the geographic area requested by the patient (or if unable, by the patient's family).  Yes   Patient/family informed of their freedom to choose among providers that offer the needed level of care, that participate in Medicare, Medicaid or managed care program needed by the patient, have an available bed and are willing to accept the patient.  Yes   Patient/family informed of [facility]'s ownership interest in [facility] and [facility], as well as of the fact that they are under no obligation to receive care at these facilities.  PASRR submitted to EDS on 11/22/17     PASRR number received on 11/22/17     Existing PASRR number confirmed on       FL2 transmitted to all facilities in geographic area requested by pt/family on 11/22/17     FL2 transmitted to all facilities within larger geographic area on       Patient informed that his/her managed care company has contracts with or will negotiate with certain facilities, including the following:        Yes   Patient/family informed of bed offers received.  [facility]     Physician recommends and patient chooses bed at      [facility].  Patient to be transferred to facility by PTAR     Patient family notified on 11/25/17 of transfer.  Name of family member notified:  Niece, Denise     PHYSICIAN       Additional Comment:     _______________________________________________ Mearl LatinNadia S Nayib Remer, LCSWA 11/25/2017, 9:04 AM

## 2017-11-25 NOTE — Progress Notes (Signed)
Physical Therapy Treatment Patient Details Name: Richard Powers MRN: [id number] DOB: 1946/04/26 Today's Date: 11/25/2017    History of Present Illness Richard Powers is a 72 y.o. male admitted from jail with medical history significant of Dementia, DM 2, HTN. Found to have Acute encephalopathy, Dehydration and AKI. During admission charges against pt have been lifted and pt is once again homeless    PT Comments    Pt performed gait training and functional mobility during PT session this am.  Pt tolerated mobility with fatigue and mild agitation due to dementia.  Pt on arrival with faulty condom cath and required pericare in standing to clean skin.  Plan remains appropriate for skilled rehab in the SNF setting to improve strength and function.      Follow Up Recommendations  SNF     Equipment Recommendations  (TBD)    Recommendations for Other Services       Precautions / Restrictions Precautions Precautions: Fall;Other (comment) Precaution Comments: can become agitated especially when fatigued  Restrictions Weight Bearing Restrictions: No    Mobility  Bed Mobility   Bed Mobility: Supine to Sit     Supine to sit: Min assist Sit to supine: Supervision   General bed mobility comments: Min assist for trunk control and LE adavancement out of bed.  Pt able to return to bed unassisted.    Transfers Overall transfer level: Needs assistance Equipment used: Rolling walker (2 wheeled) Transfers: Sit to/from Stand Sit to Stand: Min assist;From elevated surface         General transfer comment: minA for power up and steadying with RW, vc for hand placement  Ambulation/Gait Ambulation/Gait assistance: Mod assist Ambulation Distance (Feet): 40 Feet Assistive device: Rolling walker (2 wheeled) Gait Pattern/deviations: Step-through pattern;Decreased stride length;Trunk flexed;Shuffle Gait velocity: slowed Gait velocity interpretation: Below normal  speed for age/gender General Gait Details: Poor safety and constant cueing for progression of LEs and to correct position within RW.  Pt screaming profanities when he begins to fatigue.     Stairs            Wheelchair Mobility    Modified Rankin (Stroke Patients Only)       Balance Overall balance assessment: Needs assistance   Sitting balance-Leahy Scale: Good       Standing balance-Leahy Scale: Poor                              Cognition Arousal/Alertness: Awake/alert Behavior During Therapy: WFL for tasks assessed/performed Overall Cognitive Status: History of cognitive impairments - at baseline(dementia at baseline) Area of Impairment: Orientation;Problem solving;Safety/judgement                 Orientation Level: Person;Place;Situation     Following Commands: Follows one step commands inconsistently Safety/Judgement: Decreased awareness of safety;Decreased awareness of deficits   Problem Solving: Requires verbal cues General Comments: Pt screaming out profanities.        Exercises      General Comments        Pertinent Vitals/Pain Pain Assessment: Faces Faces Pain Scale: Hurts even more Pain Location: generalized unclear where located(patient crying out in pain when condom cath removed and when performing pericare, appears to be inappropriately screaming due to dementia.  ) Pain Intervention(s): Monitored during session;Repositioned    Home Living  Prior Function            PT Goals (current goals can now be found in the care plan section) Acute Rehab PT Goals Patient Stated Goal: to be warm  PT Goal Formulation: With patient Progress towards PT goals: Progressing toward goals    Frequency    Min 3X/week      PT Plan Discharge plan needs to be updated    Co-evaluation              AM-PAC PT "6 Clicks" Daily Activity  Outcome Measure  Difficulty turning over in bed (including  adjusting bedclothes, sheets and blankets)?: Unable Difficulty moving from lying on back to sitting on the side of the bed? : Unable Difficulty sitting down on and standing up from a chair with arms (e.g., wheelchair, bedside commode, etc,.)?: Unable Help needed moving to and from a bed to chair (including a wheelchair)?: A Little Help needed walking in hospital room?: A Little Help needed climbing 3-5 steps with a railing? : A Lot 6 Click Score: 11    End of Session Equipment Utilized During Treatment: Gait belt Activity Tolerance: Patient tolerated treatment well Patient left: with bed alarm set;in bed;with call bell/phone within reach;with family/visitor present Nurse Communication: Mobility status PT Visit Diagnosis: Unsteadiness on feet (R26.81);Muscle weakness (generalized) (M62.81);Difficulty in walking, not elsewhere classified (R26.2)     Time: 1610-9604 PT Time Calculation (min) (ACUTE ONLY): 24 min  Charges:  $Gait Training: 8-22 mins $Therapeutic Activity: 8-22 mins                    G Codes:       Joycelyn Rua, PTA pager [phone]    Florestine Avers 11/25/2017, 11:14 AM

## 2017-11-25 NOTE — Discharge Summary (Signed)
Physician Discharge Summary  Richard Powers ZOX:[id number] DOB: [birth year] DOA: 11/16/2017 addend--seen again on 3/29 awake alert no distress eating and drinking some much more clear-ready for skilled care placement PCP: [name], MD  Admit date: 11/16/2017 Discharge date: 11/25/2017  Time spent: 35 minutes  Recommendations for Outpatient Follow-uP 1. patient will need a basic metabolic panel and CBC in 1 month 2. Would recommend outpatient Geri psychiatry evaluation 3. Medication changes this admission include discontinuation of saxagliptin lisinopril HCTZ-please consider reinitiation of antihypertensive medications provided patient blood pressures come up   Discharge Diagnoses:  Principal Problem:   Dementia Active Problems:   Essential hypertension   DM II (diabetes mellitus, type II), controlled (HCC)   Dehydration   AKI (acute kidney injury) (HCC)   Acute encephalopathy   Elevated lactic acid level   Discharge Condition: Improved  Diet recommendation: Heart healthy low-salt  Filed Weights   11/16/17 1401 11/22/17 0504 11/24/17 1546  Weight: 68.9 kg (152 lb) 75.9 kg (167 lb 5.3 oz) 75.9 kg (167 lb 5.3 oz)    History of present illness:  82 male documented dementia, DM type 2, and hypertension.  [facility] was [facility] from jail, presented to the ED with acute kidney injury noted after labs on 3/18 were obtained from jail showed elevated creatinine.  Patient was reported to have significantly poor p.o. intake while in jail.  On presentation to the the ED, patient was noted to be hypotensive, tachycardic, with elevated lactic acid.   Patient was admitted for further assessment and management.  Initially, patient was aggressively hydrated, and started on empiric antibiotics.  Acute kidney injury has continued to improve.  Acute encephalopathy seems to have improved significantly.   There were concerns for moderate dementing illness.  significant amount of acute  encephalopathy in patient's psychopathology He has underlying dementia which is clearing and worsened by his uremia    Hospital Course:   Failure to thrive in the setting of dementia Refusing to eat food/drink, generalized weakness Currently afebrile, no leukocytosis, improved BP s/p IVF Hypotensive, tachycardic, elevated LA on admission due to severe dehydration Lactic acid trended down to normal, s/p 2.5L of IVF Pro-calcitonin negative UA negative for infection, urine culture no growth Blood culture no growth till date Chest x-ray negative--discontinue empiric IV Zosyn plus vancomycin Changed IVF to D5W due to hypernatremia-- saline lock and labs improved on discharge Dietitian consult placed, advised RN to give patient Ensure 3 times daily  AKI: Improved significantly due to dehydration Renal ultrasound normalStrict I's and O's, avoid nephrotoxins, hold home lisinopril + HCT  and consider not using in the future  Hypernatremia, resolved: D/c saline 3/27  Elevated d-dimer D-dimer 7.02 VQ scan negative for PE  Type 2 diabetes mellitus A1c 9.6 Sensitive SSI, Accu-Cheks Held home metformin, saxagliptin, insulin regular--sugars controlled during hospital stay On discharge resumed metformin and insulin 3 times daily 8 units  Hypertension Stable and controlled Held lisinopril + HCT due to AKI + hypotension on admission  Possible dementia:  continue Aricept for now, is at baseline and will need outpatient psychiatry involvement  Exfoliative skin lesions plantar surface of both feet: -Soak both feet daily. -Use Eucerin or other moisturizing cream.    Moderate malnutrition Calorie count was in place prior to discharge which can be reassessed as an outpatient Phosphorus is slightly low and should improve slowly started on megestrol this admission  Awake alert Discharge Exam: Vitals:   11/24/17 2133 11/25/17 0522  BP: 116/74 125/79  Pulse:  86 69  Resp: 17 18   Temp: 98.6 F (37 C) 98.1 F (36.7 C)  SpO2: 97% 100%    General: Oriented but in no distress, no fever no chills no nausea no vomiting Cardiovascular: S1-S2 no murmur rub or gallop Respiratory: Clinically clear no added sounds no rales no rhonchi Neurologically intact moving all 4 limbs equally Lower extremities show scale and removal of dead skin but no fungal-like infection or lesion or ulcer and pulses are intact  Discharge Instructions   Discharge Instructions    Diet - low sodium heart healthy   Complete by:  As directed    Increase activity slowly   Complete by:  As directed      Allergies as of 11/25/2017   No Known Allergies     Medication List    STOP taking these medications   hydrochlorothiazide 25 MG tablet Commonly known as:  HYDRODIURIL   lisinopril 40 MG tablet Commonly known as:  PRINIVIL,ZESTRIL     TAKE these medications   acetaminophen 500 MG tablet Commonly known as:  TYLENOL Take 1 tablet (500 mg total) by mouth every 6 (six) hours as needed.   donepezil 10 MG tablet Commonly known as:  ARICEPT Take 1 tablet (10 mg total) by mouth at bedtime.   feeding supplement (ENSURE ENLIVE) Liqd Take 237 mLs by mouth 2 (two) times daily between meals.   hydrocerin Crea Apply 1 application topically 2 (two) times daily.   insulin regular 100 units/mL injection Commonly known as:  NOVOLIN R,HUMULIN R Inject into the skin 3 (three) times daily before meals. Per sliding scale   megestrol 400 MG/10ML suspension Commonly known as:  MEGACE Take 10 mLs (400 mg total) by mouth 2 (two) times daily.   metFORMIN 500 MG tablet Commonly known as:  GLUCOPHAGE Take 500 mg by mouth 2 (two) times daily with a meal.   polyvinyl alcohol 1.4 % ophthalmic solution Commonly known as:  LIQUIFILM TEARS Place 1 drop into both eyes as needed for dry eyes.   simvastatin 40 MG tablet Commonly known as:  ZOCOR Take 1 tablet (40 mg total) by mouth daily. What  changed:  when to take this   thiamine 100 MG tablet Take 1 tablet (100 mg total) by mouth daily.      No Known Allergies Contact information for after-discharge care    Destination    [facility] .   Service:  Skilled Nursing Contact information: [address] [place] [place] [address] [phone]               The results of significant diagnostics from this hospitalization (including imaging, microbiology, ancillary and laboratory) are listed below for reference.    Significant Diagnostic Studies: Dg Chest 2 View  Result Date: 11/16/2017 CLINICAL DATA:  Altered mental status. EXAM: CHEST - 2 VIEW COMPARISON:  PA and lateral chest 10/20/2017 and 09/12/2017. CT chest 02/23/2015. FINDINGS: Lungs are clear. Heart size is normal. No pneumothorax or pleural effusion. Remote left rib fractures noted. IMPRESSION: No acute disease. Electronically Signed   By: Drusilla Kanner M.D.   On: 11/16/2017 17:14   US Renal  Result Date: 11/16/2017 CLINICAL DATA:  Acute renal failure EXAM: RENAL / URINARY TRACT ULTRASOUND COMPLETE COMPARISON:  CT 02/22/2015 FINDINGS: Right Kidney: Length: 9.1 cm. Echogenicity within normal limits. No mass or hydronephrosis visualized. Left Kidney: Length: 8.6 cm. Echogenicity within normal limits. No mass or hydronephrosis visualized. Bladder: Appears normal for degree of bladder  distention. IMPRESSION: Negative renal ultrasound Electronically Signed   By: Jasmine Pang M.D.   On: 11/16/2017 23:21   Nm Pulmonary Perf And Vent  Result Date: 11/17/2017 CLINICAL DATA:  Recurrent dyspnea.  Positive D-dimer. EXAM: NUCLEAR MEDICINE VENTILATION - PERFUSION LUNG SCAN TECHNIQUE: Ventilation images were obtained in multiple projections using inhaled aerosol Tc-88m DTPA. Perfusion images were obtained in multiple projections after intravenous injection of Tc-62m-MAA. RADIOPHARMACEUTICALS:  32.3 mCi of Tc-65m DTPA aerosol inhalation and 4.27 mCi  Tc24m-MAA IV COMPARISON:  Chest x-ray dated 11/16/2017 FINDINGS: Ventilation: Normal. Perfusion: No wedge shaped peripheral perfusion defects to suggest acute pulmonary embolism. Normal perfusion bilaterally. IMPRESSION: Normal ventilation-perfusion lung scan. No findings suggestive of pulmonary embolism. Electronically Signed   By: Francene Boyers M.D.   On: 11/17/2017 13:16   Dg Chest Port 1 View  Result Date: 11/16/2017 CLINICAL DATA:  Dyspnea today. EXAM: PORTABLE CHEST 1 VIEW COMPARISON:  PA and lateral chest 11/16/2017. Single-view of the chest 02/22/2015. CT chest 02/23/2015. FINDINGS: The lungs are clear. Heart size is normal. Aortic atherosclerosis is noted. No pneumothorax or pleural effusion. Remote left rib fractures are seen. IMPRESSION: No acute disease. Atherosclerosis. Electronically Signed   By: Drusilla Kanner M.D.   On: 11/16/2017 20:27    Microbiology: Recent Results (from the past 240 hour(s))  Blood Culture (routine x 2)     Status: None   Collection Time: 11/16/17  1:56 PM  Result Value Ref Range Status   Specimen Description BLOOD LEFT ANTECUBITAL  Final   Special Requests   Final    BOTTLES DRAWN AEROBIC ONLY Blood Culture results may not be optimal due to an inadequate volume of blood received in culture bottles   Culture   Final    NO GROWTH 5 DAYS Performed at [facility], [address], [place], [place] [address]    Report Status 11/21/2017 FINAL  Final  Urine culture     Status: None   Collection Time: 11/16/17  4:30 PM  Result Value Ref Range Status   Specimen Description URINE, RANDOM  Final   Special Requests NONE  Final   Culture   Final    NO GROWTH Performed at [facility], [address], [place], [place] [address]    Report Status 11/17/2017 FINAL  Final  Culture, blood (Routine X 2) w Reflex to ID Panel     Status: None   Collection Time: 11/16/17 10:00 PM  Result Value Ref Range Status   Specimen Description BLOOD RIGHT HAND   Final   Special Requests IN PEDIATRIC BOTTLE Blood Culture adequate volume  Final   Culture   Final    NO GROWTH 5 DAYS Performed at [facility], [address], [place], [place] [address]    Report Status 11/21/2017 FINAL  Final  MRSA PCR Screening     Status: None   Collection Time: 11/17/17 12:48 AM  Result Value Ref Range Status   MRSA by PCR NEGATIVE NEGATIVE Final    Comment:        The GeneXpert MRSA Assay (FDA approved for NASAL specimens only), is one component of a comprehensive MRSA colonization surveillance program. It is not intended to diagnose MRSA infection nor to guide or monitor treatment for MRSA infections. Performed at [facility], [address], [place], [place] [address]      Labs: Basic Metabolic Panel: Recent Labs  Lab 11/20/17 [phone] 11/21/17 [phone] 11/22/17 [phone] 11/23/17 1005 11/24/17  0445  NA 145 142 143 145 143  K 3.1* 3.6 3.8 3.3* 4.2  CL 114* 114* 111 110 109  CO2 24 23 24 22 25   GLUCOSE 170* 132* 135* 126* 174*  BUN 24* 15 12 14 20   CREATININE 1.66* 1.45* 1.37* 1.32* 1.38*  CALCIUM 8.4* 8.2* 8.1* 8.6* 8.5*  PHOS  --  1.7*  --   --  2.3*   Liver Function Tests: Recent Labs  Lab 11/21/17 0737  ALBUMIN 2.6*   No results for input(s): LIPASE, AMYLASE in the last 168 hours. No results for input(s): AMMONIA in the last 168 hours. CBC: Recent Labs  Lab 11/20/17 0624 11/21/17 0737 11/22/17 0613 11/24/17 0445 11/25/17 0632  WBC 5.5 4.7 4.9 5.0 5.2  NEUTROABS 3.3 2.4 2.7 2.9 3.2  HGB 10.4* 10.7* 10.3* 10.4* 10.6*  HCT 32.0* 32.7* 31.6* 31.6* 32.4*  MCV 89.1 90.3 87.8 90.5 90.5  PLT 118* 101* 119* 145* 174   Cardiac Enzymes: No results for input(s): CKTOTAL, CKMB, CKMBINDEX, TROPONINI in the last 168 hours. BNP: BNP (last 3 results) No results for input(s): BNP in the last 8760 hours.  ProBNP (last 3 results) No results for input(s): PROBNP in the last 8760 hours.  CBG: Recent Labs  Lab 11/24/17 1740  11/24/17 2112 11/25/17 0016 11/25/17 0520 11/25/17 0827  GLUCAP 106* 273* 166* 147* 124*       Signed:  Rhetta MuraJai-Gurmukh Arlys Scatena MD   [facility] 11/25/2017, 12:16 PM

## 2017-11-25 NOTE — Progress Notes (Signed)
Patient discharged via ambulance to [facility].  Iv access removed and compression dressing applied.  Discharged information given to ambulance tech.  Report called in to nurse Cherrell at facility.  Patient stable for transport.

## 2018-01-13 ENCOUNTER — Ambulatory Visit: Payer: Medicare Other | Admitting: Family Medicine

## 2018-02-20 ENCOUNTER — Inpatient Hospital Stay (HOSPITAL_COMMUNITY): Payer: Medicare Other

## 2018-02-20 ENCOUNTER — Inpatient Hospital Stay (HOSPITAL_COMMUNITY): Payer: Medicare Other | Admitting: Certified Registered Nurse Anesthetist

## 2018-02-20 ENCOUNTER — Inpatient Hospital Stay (HOSPITAL_COMMUNITY)
Admission: EM | Admit: 2018-02-20 | Discharge: 2018-02-23 | DRG: 482 | Disposition: A | Discharge status: Skilled Nursing Facility | Payer: Medicare Other | Source: Skilled Nursing Facility | Attending: Internal Medicine | Admitting: Internal Medicine

## 2018-02-20 ENCOUNTER — Encounter (HOSPITAL_COMMUNITY): Payer: Self-pay | Admitting: Oncology

## 2018-02-20 ENCOUNTER — Emergency Department (HOSPITAL_COMMUNITY): Payer: Medicare Other

## 2018-02-20 DIAGNOSIS — I10 Essential (primary) hypertension: Secondary | ICD-10-CM | POA: Diagnosis not present

## 2018-02-20 DIAGNOSIS — E1129 Type 2 diabetes mellitus with other diabetic kidney complication: Secondary | ICD-10-CM

## 2018-02-20 DIAGNOSIS — F0390 Unspecified dementia without behavioral disturbance: Secondary | ICD-10-CM | POA: Diagnosis present

## 2018-02-20 DIAGNOSIS — E1122 Type 2 diabetes mellitus with diabetic chronic kidney disease: Secondary | ICD-10-CM | POA: Diagnosis present

## 2018-02-20 DIAGNOSIS — S7290XA Unspecified fracture of unspecified femur, initial encounter for closed fracture: Secondary | ICD-10-CM

## 2018-02-20 DIAGNOSIS — I129 Hypertensive chronic kidney disease with stage 1 through stage 4 chronic kidney disease, or unspecified chronic kidney disease: Secondary | ICD-10-CM | POA: Diagnosis present

## 2018-02-20 DIAGNOSIS — W010XXA Fall on same level from slipping, tripping and stumbling without subsequent striking against object, initial encounter: Secondary | ICD-10-CM | POA: Diagnosis present

## 2018-02-20 DIAGNOSIS — S72142A Displaced intertrochanteric fracture of left femur, initial encounter for closed fracture: Principal | ICD-10-CM | POA: Diagnosis present

## 2018-02-20 DIAGNOSIS — Y92129 Unspecified place in nursing home as the place of occurrence of the external cause: Secondary | ICD-10-CM

## 2018-02-20 DIAGNOSIS — Z87891 Personal history of nicotine dependence: Secondary | ICD-10-CM

## 2018-02-20 DIAGNOSIS — Z7984 Long term (current) use of oral hypoglycemic drugs: Secondary | ICD-10-CM | POA: Diagnosis not present

## 2018-02-20 DIAGNOSIS — N182 Chronic kidney disease, stage 2 (mild): Secondary | ICD-10-CM | POA: Diagnosis present

## 2018-02-20 DIAGNOSIS — Z794 Long term (current) use of insulin: Secondary | ICD-10-CM | POA: Diagnosis not present

## 2018-02-20 DIAGNOSIS — E1121 Type 2 diabetes mellitus with diabetic nephropathy: Secondary | ICD-10-CM | POA: Diagnosis not present

## 2018-02-20 DIAGNOSIS — F0150 Vascular dementia without behavioral disturbance: Secondary | ICD-10-CM | POA: Diagnosis not present

## 2018-02-20 DIAGNOSIS — Z01818 Encounter for other preprocedural examination: Secondary | ICD-10-CM

## 2018-02-20 DIAGNOSIS — S72002A Fracture of unspecified part of neck of left femur, initial encounter for closed fracture: Secondary | ICD-10-CM | POA: Diagnosis present

## 2018-02-20 LAB — SURGICAL PCR SCREEN
MRSA, PCR: NEGATIVE
STAPHYLOCOCCUS AUREUS: NEGATIVE

## 2018-02-20 LAB — CBC WITH DIFFERENTIAL/PLATELET
Abs Immature Granulocytes: 0.2 10*3/uL — ABNORMAL HIGH (ref 0.0–0.1)
BASOS ABS: 0 10*3/uL (ref 0.0–0.1)
Basophils Relative: 0 %
EOS ABS: 0 10*3/uL (ref 0.0–0.7)
Eosinophils Relative: 0 %
HEMATOCRIT: 32.7 % — AB (ref 39.0–52.0)
Hemoglobin: 10.5 g/dL — ABNORMAL LOW (ref 13.0–17.0)
IMMATURE GRANULOCYTES: 2 %
LYMPHS ABS: 1.1 10*3/uL (ref 0.7–4.0)
Lymphocytes Relative: 11 %
MCH: 30.9 pg (ref 26.0–34.0)
MCHC: 32.1 g/dL (ref 30.0–36.0)
MCV: 96.2 fL (ref 78.0–100.0)
Monocytes Absolute: 0.9 10*3/uL (ref 0.1–1.0)
Monocytes Relative: 9 %
Neutro Abs: 7.9 10*3/uL — ABNORMAL HIGH (ref 1.7–7.7)
Neutrophils Relative %: 78 %
PLATELETS: 204 10*3/uL (ref 150–400)
RBC: 3.4 MIL/uL — AB (ref 4.22–5.81)
RDW: 12.8 % (ref 11.5–15.5)
WBC: 10.1 10*3/uL (ref 4.0–10.5)

## 2018-02-20 LAB — BASIC METABOLIC PANEL
Anion gap: 15 (ref 5–15)
BUN: 16 mg/dL (ref 6–20)
CHLORIDE: 104 mmol/L (ref 101–111)
CO2: 23 mmol/L (ref 22–32)
CREATININE: 1.29 mg/dL — AB (ref 0.61–1.24)
Calcium: 9.2 mg/dL (ref 8.9–10.3)
GFR, EST NON AFRICAN AMERICAN: 54 mL/min — AB (ref >60)
Glucose, Bld: 300 mg/dL — ABNORMAL HIGH (ref 65–99)
Potassium: 3.9 mmol/L (ref 3.5–5.1)
SODIUM: 142 mmol/L (ref 135–145)

## 2018-02-20 LAB — GLUCOSE, CAPILLARY
GLUCOSE-CAPILLARY: 249 mg/dL — AB (ref 65–99)
Glucose-Capillary: 186 mg/dL — ABNORMAL HIGH (ref 65–99)
Glucose-Capillary: 122 mg/dL — ABNORMAL HIGH (ref 65–99)

## 2018-02-20 LAB — TYPE AND SCREEN
ABO/RH(D): O POS
Antibody Screen: NEGATIVE

## 2018-02-20 LAB — ABO/RH: ABO/RH(D): O POS

## 2018-02-20 LAB — PROTIME-INR
INR: 1.15
PROTHROMBIN TIME: 14.6 s (ref 11.4–15.2)

## 2018-02-20 MED ORDER — ONDANSETRON HCL 4 MG/2ML IJ SOLN: 4.0000 mg | Freq: Four times a day (QID) | INTRAMUSCULAR | Status: DC | PRN

## 2018-02-20 MED ORDER — PROPOFOL 10 MG/ML IV BOLUS
INTRAVENOUS | Status: AC
  Filled 2018-02-20: qty 20

## 2018-02-20 MED ORDER — LACTATED RINGERS IV SOLN
INTRAVENOUS | Status: DC
  Administered 2018-02-20 – 2018-02-21 (×2): via INTRAVENOUS

## 2018-02-20 MED ORDER — SODIUM CHLORIDE 0.9 % IV SOLN
Freq: Once | INTRAVENOUS | Status: AC
  Administered 2018-02-20: 04:00:00 via INTRAVENOUS

## 2018-02-20 MED ORDER — INSULIN ASPART 100 UNIT/ML ~~LOC~~ SOLN
0.0000 [IU] | Freq: Three times a day (TID) | SUBCUTANEOUS | Status: DC
  Administered 2018-02-20: 1 [IU] via SUBCUTANEOUS
  Administered 2018-02-20: 3 [IU] via SUBCUTANEOUS
  Administered 2018-02-20: 1 [IU] via SUBCUTANEOUS
  Administered 2018-02-21 (×2): 2 [IU] via SUBCUTANEOUS
  Administered 2018-02-22: 5 [IU] via SUBCUTANEOUS
  Administered 2018-02-22: 2 [IU] via SUBCUTANEOUS
  Administered 2018-02-22: 9 [IU] via SUBCUTANEOUS
  Administered 2018-02-23 (×2): 3 [IU] via SUBCUTANEOUS

## 2018-02-20 MED ORDER — MORPHINE SULFATE (PF) 2 MG/ML IV SOLN
0.5000 mg | INTRAVENOUS | Status: DC | PRN
Start: 2018-02-20 — End: 2018-02-21
  Administered 2018-02-20 – 2018-02-21 (×5): 0.5 mg via INTRAVENOUS
  Filled 2018-02-20 (×6): qty 1

## 2018-02-20 MED ORDER — MORPHINE SULFATE (PF) 4 MG/ML IV SOLN
4.0000 mg | INTRAVENOUS | Status: DC | PRN
  Administered 2018-02-20: 4 mg via INTRAVENOUS
  Filled 2018-02-20 (×2): qty 1

## 2018-02-20 MED ORDER — ACETAMINOPHEN 325 MG PO TABS: 650.0000 mg | ORAL_TABLET | Freq: Four times a day (QID) | ORAL | Status: DC | PRN

## 2018-02-20 MED ORDER — FENTANYL CITRATE (PF) 250 MCG/5ML IJ SOLN
INTRAMUSCULAR | Status: AC
  Filled 2018-02-20: qty 5

## 2018-02-20 NOTE — Progress Notes (Signed)
Patient admitted after midnight, please see H&P.  Needs surgery for hip but family will need to consent as he has advanced dementia.  AM labs ordered Marlin CanaryJessica Mattalyn Anderegg DO

## 2018-02-20 NOTE — H&P (Signed)
History and Physical    Richard GuessWilliam Thomas Vogelsang Powers WUJ:[id number] DOB: 09-Sep-1945 DOA: 02/20/2018  PCP: [name], [name], MD  Patient coming from: Nursing home.  History obtained from ER physician.  Chief Complaint: Fall.  HPI: Richard Powers is a 72 y.o. male with history of dementia, diabetes mellitus type 2, anemia, chronic kidney disease, hyperlipidemia was brought to the ER the patient had an unwitnessed fall at the nursing facility.  Not much history is available as patient has advanced dementia.  ED Course: In the ER patient appeared nonfocal oriented to his name follows commands moves all extremity's.  X-rays reveal left hip fracture and on-call orthopedic surgeon Dr. Aundria Rudogers was consulted and patient is being admitted for further management.  Review of Systems: As per HPI, rest all negative.   Past Medical History:  Diagnosis Date  . Diabetes mellitus without complication (HCC)   . Hypertension     History reviewed. No pertinent surgical history.   reports that he has quit smoking. He has never used smokeless tobacco. He reports that he drinks alcohol. He reports that he does not use drugs.  No Known Allergies  Family History  Family history unknown: Yes    Prior to Admission medications   Medication Sig Start Date End Date Taking? Authorizing Provider  acetaminophen (TYLENOL) 500 MG tablet Take 1 tablet (500 mg total) by mouth every 6 (six) hours as needed. 09/13/17   Esperanza SheetsBuriev, Ulugbek N, MD  donepezil (ARICEPT) 10 MG tablet Take 1 tablet (10 mg total) by mouth at bedtime. 11/24/17   Rhetta MuraSamtani, Jai-Gurmukh, MD  feeding supplement, ENSURE ENLIVE, (ENSURE ENLIVE) LIQD Take 237 mLs by mouth 2 (two) times daily between meals. 11/24/17   Rhetta MuraSamtani, Jai-Gurmukh, MD  hydrocerin (EUCERIN) CREA Apply 1 application topically 2 (two) times daily. 11/24/17   Rhetta MuraSamtani, Jai-Gurmukh, MD  insulin regular (NOVOLIN R,HUMULIN R) 100 units/mL injection Inject into the skin 3 (three) times  daily before meals. Per sliding scale    [provider]  megestrol (MEGACE) 400 MG/10ML suspension Take 10 mLs (400 mg total) by mouth 2 (two) times daily. 11/24/17   Rhetta MuraSamtani, Jai-Gurmukh, MD  metFORMIN (GLUCOPHAGE) 500 MG tablet Take 500 mg by mouth 2 (two) times daily with a meal.    [provider]  polyvinyl alcohol (LIQUIFILM TEARS) 1.4 % ophthalmic solution Place 1 drop into both eyes as needed for dry eyes. 11/24/17   Rhetta MuraSamtani, Jai-Gurmukh, MD  simvastatin (ZOCOR) 40 MG tablet Take 1 tablet (40 mg total) by mouth daily. Patient taking differently: Take 40 mg by mouth every evening.  10/21/17   [name], [name], MD  thiamine 100 MG tablet Take 1 tablet (100 mg total) by mouth daily. 11/24/17   Rhetta MuraSamtani, Jai-Gurmukh, MD    Physical Exam: Vitals:   02/20/18 0315 02/20/18 0345 02/20/18 0445 02/20/18 0531  BP: (!) 168/98 (!) 171/96 (!) 180/99   Pulse: 92 86 84 87  Resp: 13 (!) 8 10   Temp:    98.1 F (36.7 C)  TempSrc:    Oral  SpO2: 98% 100% 100% 100%      Constitutional: Moderately built and nourished. Vitals:   02/20/18 0315 02/20/18 0345 02/20/18 0445 02/20/18 0531  BP: (!) 168/98 (!) 171/96 (!) 180/99   Pulse: 92 86 84 87  Resp: 13 (!) 8 10   Temp:    98.1 F (36.7 C)  TempSrc:    Oral  SpO2: 98% 100% 100% 100%   Eyes: Anicteric no pallor.  ENMT: No discharge from the ears eyes nose or mouth. Neck: No mass palpated no neck rigidity no JVD appreciated. Respiratory: No rhonchi or crepitations. Cardiovascular: S1-S2 heard no murmurs appreciated. Abdomen: Soft nontender bowel sounds present. Musculoskeletal: Pain on moving left hip. Skin: No rash. Neurologic: Alert awake oriented to name.  Moves all extremities.  Psychiatric: Oriented to his name.   Labs on Admission: I have personally reviewed following labs and imaging studies  CBC: Recent Labs  Lab 02/20/18 0229  WBC 10.1  NEUTROABS 7.9*  HGB 10.5*  HCT 32.7*  MCV 96.2  PLT 204   Basic  Metabolic Panel: Recent Labs  Lab 02/20/18 0229  NA 142  K 3.9  CL 104  CO2 23  GLUCOSE 300*  BUN 16  CREATININE 1.29*  CALCIUM 9.2   GFR: CrCl cannot be calculated (Unknown ideal weight.). Liver Function Tests: No results for input(s): AST, ALT, ALKPHOS, BILITOT, PROT, ALBUMIN in the last 168 hours. No results for input(s): LIPASE, AMYLASE in the last 168 hours. No results for input(s): AMMONIA in the last 168 hours. Coagulation Profile: Recent Labs  Lab 02/20/18 0229  INR 1.15   Cardiac Enzymes: No results for input(s): CKTOTAL, CKMB, CKMBINDEX, TROPONINI in the last 168 hours. BNP (last 3 results) No results for input(s): PROBNP in the last 8760 hours. HbA1C: No results for input(s): HGBA1C in the last 72 hours. CBG: No results for input(s): GLUCAP in the last 168 hours. Lipid Profile: No results for input(s): CHOL, HDL, LDLCALC, TRIG, CHOLHDL, LDLDIRECT in the last 72 hours. Thyroid Function Tests: No results for input(s): TSH, T4TOTAL, FREET4, T3FREE, THYROIDAB in the last 72 hours. Anemia Panel: No results for input(s): VITAMINB12, FOLATE, FERRITIN, TIBC, IRON, RETICCTPCT in the last 72 hours. Urine analysis:    Component Value Date/Time   COLORURINE YELLOW 11/16/2017 1630   APPEARANCEUR CLEAR 11/16/2017 1630   LABSPEC 1.021 11/16/2017 1630   PHURINE 5.0 11/16/2017 1630   GLUCOSEU NEGATIVE 11/16/2017 1630   HGBUR NEGATIVE 11/16/2017 1630   BILIRUBINUR NEGATIVE 11/16/2017 1630   KETONESUR 20 (A) 11/16/2017 1630   PROTEINUR NEGATIVE 11/16/2017 1630   UROBILINOGEN 0.2 02/22/2015 2331   NITRITE NEGATIVE 11/16/2017 1630   LEUKOCYTESUR NEGATIVE 11/16/2017 1630   Sepsis Labs: @LABRCNTIP (procalcitonin:4,lacticidven:4) )No results found for this or any previous visit (from the past 240 hour(s)).   Radiological Exams on Admission: Dg Chest 1 View  Result Date: 02/20/2018 CLINICAL DATA:  Preop left hip fracture. EXAM: CHEST  1 VIEW COMPARISON:  11/16/2017  FINDINGS: Top-normal size heart. Aortic atherosclerosis is noted. No pulmonary consolidation, effusion or pneumothorax. Skin fold artifacts suspected along the periphery the right lung base. No acute osseous abnormality. IMPRESSION: No active disease. Electronically Signed   By: Tollie Eth M.D.   On: 02/20/2018 02:23   Dg Hip Unilat W Or Wo Pelvis 2-3 Views Left  Result Date: 02/20/2018 CLINICAL DATA:  Left hip pain after fall EXAM: DG HIP (WITH OR WITHOUT PELVIS) 2-3V LEFT COMPARISON:  None. FINDINGS: Acute, closed, slightly distracted left intertrochanteric fracture of the femur is identified. Intact bony pelvis. No hip joint dislocation. IMPRESSION: Acute intertrochanteric fracture of the left femur. Electronically Signed   By: Tollie Eth M.D.   On: 02/20/2018 02:52    EKG: Independently reviewed.  Normal sinus rhythm with RBBB.  Assessment/Plan Principal Problem:   Closed left hip fracture, initial encounter [facility]) Active Problems:   Essential hypertension   DM (diabetes mellitus), type 2 with renal complications (HCC)  Dementia   CKD (chronic kidney disease) stage 2, GFR 60-89 ml/min   Closed left hip fracture (HCC)    1. Left hip fracture -circumstances of patient's fall is unknown.  CT head is pending.  Will monitor in telemetry.  Patient will be kept n.p.o. in anticipation of possible surgery.  Patient will be at moderate risk for intermediate risk procedure.  Pain relief medications. 2. Hypertension -patient's home medication does not show any antihypertensives.  For now we will keep patient on PRN IV hydralazine.  Closely follow blood pressure trends. 3. Diabetes mellitus type 2 -since patient is n.p.o. I have kept patient on sliding scale coverage.  Closely follow CBGs.  Resume home medications once medication list is updated and patient starts taking orally. 4. Dementia on Aricept which can be continued after surgery. 5. Chronic kidney disease stage II appears to be at  baseline. 6. Chronic anemia likely from renal disease -hemoglobin appears to be at baseline.  Chest x-ray UA and CT are pending.   DVT prophylaxis: SCDs. Code Status: Full code. Family Communication: No family at the bedside. Disposition Plan: Back to nursing facility. Consults called: Orthopedics. Admission status: Inpatient.   Eduard Clos MD [facility] Pager [phone].  If 7PM-7AM, please contact night-coverage [email or web address] Password [facility]  02/20/2018, 5:40 AM

## 2018-02-20 NOTE — Progress Notes (Signed)
Attempted to contact emergency contacts with no answer. Left message with niece to contact us. Notified Dr. Jena GaussHaddix.

## 2018-02-20 NOTE — Progress Notes (Signed)
pT IV infiltrated he will not let me take the IV out he wanted me to leave him alone

## 2018-02-20 NOTE — Progress Notes (Signed)
Dr. Caryn BeeKevin Haddix to assume care of hip fracture from orthopedic standpoint.  Planning for likely surgery today.  Consult note to follow this am, please keep NPO.

## 2018-02-20 NOTE — Plan of Care (Signed)
  Problem: Education: Goal: Knowledge of General Education information will improve Outcome: Progressing Note:  Patient does not comprhend

## 2018-02-20 NOTE — Consult Note (Signed)
Orthopaedic Trauma Service (OTS) Consult   Patient ID: Richard Powers MRN: [id number] DOB/AGE: [birth year] 72 y.o.  Reason for Consult:Left hip fracture Referring Physician: Dr. Duwayne Heck, MD [facility]  HPI: Richard Powers is an 72 y.o. male who is being seen in consultation at the request of Dr. Aundria Rud for evaluation of left intertrochanteric femur fracture.  Patient had unwitnessed fall at his nursing facility.  He had immediate pain and inability to bear weight.  X-rays at the facility showed a intertrochanteric femur fracture.  He was brought to [facility].  Patient has advanced dementia and is able to provide significant history.  Patient knows he is in the hospital he does not know what year or what hospital he is in.  Currently complains of only left hip pain.  Unable to perform any other history due to his dementia.  Past Medical History:  Diagnosis Date  . Diabetes mellitus without complication (HCC)   . Hypertension     History reviewed. No pertinent surgical history.  Family History  Family history unknown: Yes    Social History:  reports that he has quit smoking. He has never used smokeless tobacco. He reports that he drinks alcohol. He reports that he does not use drugs.  Allergies: No Known Allergies  Medications:  No current facility-administered medications on file prior to encounter.    Current Outpatient Medications on File Prior to Encounter  Medication Sig Dispense Refill  . acetaminophen (TYLENOL) 500 MG tablet Take 1 tablet (500 mg total) by mouth every 6 (six) hours as needed. 30 tablet 0  . donepezil (ARICEPT) 10 MG tablet Take 1 tablet (10 mg total) by mouth at bedtime. 30 tablet 3  . feeding supplement, ENSURE ENLIVE, (ENSURE ENLIVE) LIQD Take 237 mLs by mouth 2 (two) times daily between meals. 237 mL 12  . hydrocerin (EUCERIN) CREA Apply 1 application topically 2 (two) times daily.  0  . insulin regular  (NOVOLIN R,HUMULIN R) 100 units/mL injection Inject into the skin 3 (three) times daily before meals. Per sliding scale    . megestrol (MEGACE) 400 MG/10ML suspension Take 10 mLs (400 mg total) by mouth 2 (two) times daily. 240 mL 0  . metFORMIN (GLUCOPHAGE) 500 MG tablet Take 500 mg by mouth 2 (two) times daily with a meal.    . polyvinyl alcohol (LIQUIFILM TEARS) 1.4 % ophthalmic solution Place 1 drop into both eyes as needed for dry eyes. 15 mL 0  . simvastatin (ZOCOR) 40 MG tablet Take 1 tablet (40 mg total) by mouth daily. (Patient taking differently: Take 40 mg by mouth every evening. ) 30 tablet 0  . thiamine 100 MG tablet Take 1 tablet (100 mg total) by mouth daily.       ROS: Unable to perform due to his advanced dementia  Exam: Blood pressure (!) 180/99, pulse 87, temperature 98.1 F (36.7 C), temperature source Oral, resp. rate 10, SpO2 100 %. General: Awake and alert no acute distress Orientation: Oriented to the fact that he is in the hospital.  No further orientation other to himself Mood and Affect: Cooperative Gait: Unable to assess due to his fracture and being bedbound  Left lower extremity: Skin without lesions.  Unable to perform any range of motion the patient screams out in pain.  Leg is shortened and externally rotated.  Patient has active EHL, FHL, gastrocsoleus and tibialis anterior motor function.  Sensation is grossly intact.  He has a warm well-perfused  foot.  No obvious deformities about the ankle or knee.  Right lower extremity: Skin without lesions. No tenderness to palpation. Full painless ROM, full strength in each muscle groups without evidence of instability.   Medical Decision Making: Imaging: X-rays of the pelvis and left hip show a 2 part left intertrochanteric femur fracture with a significant external rotation of the femoral shaft.  Labs:  Results for orders placed or performed during the hospital encounter of 02/20/18 (from the past 24 hour(s))   Basic metabolic panel     Status: Abnormal   Collection Time: 02/20/18  2:29 AM  Result Value Ref Range   Sodium 142 135 - 145 mmol/L   Potassium 3.9 3.5 - 5.1 mmol/L   Chloride 104 101 - 111 mmol/L   CO2 23 22 - 32 mmol/L   Glucose, Bld 300 (H) 65 - 99 mg/dL   BUN 16 6 - 20 mg/dL   Creatinine, Ser 1.611.29 (H) 0.61 - 1.24 mg/dL   Calcium 9.2 8.9 - 09.610.3 mg/dL   GFR calc non Af Amer 54 (L) >60 mL/min   GFR calc Af Amer >60 >60 mL/min   Anion gap 15 5 - 15  CBC WITH DIFFERENTIAL     Status: Abnormal   Collection Time: 02/20/18  2:29 AM  Result Value Ref Range   WBC 10.1 4.0 - 10.5 K/uL   RBC 3.40 (L) 4.22 - 5.81 MIL/uL   Hemoglobin 10.5 (L) 13.0 - 17.0 g/dL   HCT 04.532.7 (L) 40.939.0 - 81.152.0 %   MCV 96.2 78.0 - 100.0 fL   MCH 30.9 26.0 - 34.0 pg   MCHC 32.1 30.0 - 36.0 g/dL   RDW 91.412.8 78.211.5 - 95.615.5 %   Platelets 204 150 - 400 K/uL   Neutrophils Relative % 78 %   Neutro Abs 7.9 (H) 1.7 - 7.7 K/uL   Lymphocytes Relative 11 %   Lymphs Abs 1.1 0.7 - 4.0 K/uL   Monocytes Relative 9 %   Monocytes Absolute 0.9 0.1 - 1.0 K/uL   Eosinophils Relative 0 %   Eosinophils Absolute 0.0 0.0 - 0.7 K/uL   Basophils Relative 0 %   Basophils Absolute 0.0 0.0 - 0.1 K/uL   Immature Granulocytes 2 %   Abs Immature Granulocytes 0.2 (H) 0.0 - 0.1 K/uL  Protime-INR     Status: None   Collection Time: 02/20/18  2:29 AM  Result Value Ref Range   Prothrombin Time 14.6 11.4 - 15.2 seconds   INR 1.15   Type and screen [facility]     Status: None   Collection Time: 02/20/18  2:29 AM  Result Value Ref Range   ABO/RH(D) O POS    Antibody Screen NEG    Sample Expiration      02/23/2018 Performed at [facility], [address] [place], [place] [address]   ABO/Rh     Status: None (Preliminary result)   Collection Time: 02/20/18  2:29 AM  Result Value Ref Range   ABO/RH(D)      O POS Performed at [facility], [address] [place], [place] [address]   Glucose, capillary      Status: Abnormal   Collection Time: 02/20/18  6:21 AM  Result Value Ref Range   Glucose-Capillary 249 (H) 65 - 99 mg/dL   Comment 1 Document in Chart     Medical history and chart was reviewed  Assessment/Plan: 72 year old male with a history of advanced dementia,  diabetes type 2 on insulin along with chronic anemia and chronic kidney disease with left intertrochanteric femur fracture  Patient will need to have surgical fixation of his left hip to provide pain control allow him to mobilize and get out of bed with his minimal pain is possible.  Briefly discussed with his niece but she was getting off work and we have been unable to reach her again to obtain consent. Will delay the patient until can discuss with family further and obtain consent.  Roby Lofts, MD [facility] [phone] (phone)

## 2018-02-20 NOTE — ED Provider Notes (Signed)
[facility] EMERGENCY DEPARTMENT Provider Note   CSN: [id number] Arrival date & time: 02/20/18  0138     History   Chief Complaint Chief Complaint  Patient presents with  . Hip Pain   Level 5 caveat: Dementia HPI Richard Powers is a 72 y.o. male.  HPI 72 y.o. male with medical history significant of Dementia, DM 2, HTN who presents to the emergency department after a fall at [facility].  Patient was given 150 mcg of fentanyl in route for what was a confirmed a left hip fracture performed on outpatient imaging at the nursing facility.  Patient has dementia per the medical chart.  He is not on anticoagulants.  He is unable to provide any additional information regarding the fall itself.  There are no signs of head injury.  He is noted to be moving all 4 extremities.  He complains of left hip pain at this time.     Past Medical History:  Diagnosis Date  . Diabetes mellitus without complication (HCC)   . Hypertension     Patient Active Problem List   Diagnosis Date Noted  . Dehydration 11/16/2017  . AKI (acute kidney injury) (HCC) 11/16/2017  . Acute encephalopathy 11/16/2017  . Elevated lactic acid level 11/16/2017  . Glaucoma 10/17/2017  . Dementia 09/15/2017  . Sepsis (HCC) 09/09/2017  . Essential hypertension 09/09/2017  . DM II (diabetes mellitus, type II), controlled (HCC) 09/09/2017    History reviewed. No pertinent surgical history.      Home Medications    Prior to Admission medications   Medication Sig Start Date End Date Taking? Authorizing Provider  acetaminophen (TYLENOL) 500 MG tablet Take 1 tablet (500 mg total) by mouth every 6 (six) hours as needed. 09/13/17   Esperanza Sheets, MD  donepezil (ARICEPT) 10 MG tablet Take 1 tablet (10 mg total) by mouth at bedtime. 11/24/17   Rhetta Mura, MD  feeding supplement, ENSURE ENLIVE, (ENSURE ENLIVE) LIQD Take 237 mLs by mouth 2 (two) times daily between meals. 11/24/17    Rhetta Mura, MD  hydrocerin (EUCERIN) CREA Apply 1 application topically 2 (two) times daily. 11/24/17   Rhetta Mura, MD  insulin regular (NOVOLIN R,HUMULIN R) 100 units/mL injection Inject into the skin 3 (three) times daily before meals. Per sliding scale    [provider]  megestrol (MEGACE) 400 MG/10ML suspension Take 10 mLs (400 mg total) by mouth 2 (two) times daily. 11/24/17   Rhetta Mura, MD  metFORMIN (GLUCOPHAGE) 500 MG tablet Take 500 mg by mouth 2 (two) times daily with a meal.    [provider]  polyvinyl alcohol (LIQUIFILM TEARS) 1.4 % ophthalmic solution Place 1 drop into both eyes as needed for dry eyes. 11/24/17   Rhetta Mura, MD  simvastatin (ZOCOR) 40 MG tablet Take 1 tablet (40 mg total) by mouth daily. Patient taking differently: Take 40 mg by mouth every evening.  10/21/17   [name], MD  thiamine 100 MG tablet Take 1 tablet (100 mg total) by mouth daily. 11/24/17   Rhetta Mura, MD    Family History No family history on file.  Social History Social History   Tobacco Use  . Smoking status: Former Games developer  . Smokeless tobacco: Never Used  Substance Use Topics  . Alcohol use: Yes  . Drug use: No     Allergies   Patient has no known allergies.   Review of Systems Review of Systems  Unable to perform ROS:  Dementia     Physical Exam Updated Vital Signs BP (!) 143/83 (BP Location: Left Arm)   Pulse 90   Temp 99.2 F (37.3 C) (Oral)   Resp 16   SpO2 98%   Physical Exam  HENT:  Head: Normocephalic and atraumatic.  Eyes: EOM are normal.  Neck: Normal range of motion. Neck supple.  C-spine nontender  Cardiovascular: Normal rate, regular rhythm and intact distal pulses.  Pulmonary/Chest: Effort normal and breath sounds normal. No respiratory distress.  Abdominal: He exhibits no distension. There is no tenderness.  Musculoskeletal: Normal range of motion.  Painful range of motion of the  left hip.  Full range of motion of left knee.  Normal pulses in left foot.  Neurological: He is alert.  Oriented to person and place but not time  Skin: Skin is warm and dry.  Nursing note and vitals reviewed.    ED Treatments / Results  Labs (all labs ordered are listed, but only abnormal results are displayed) Labs Reviewed  BASIC METABOLIC PANEL  CBC WITH DIFFERENTIAL/PLATELET  PROTIME-INR  TYPE AND SCREEN    EKG EKG Interpretation  Date/Time:  Monday February 20 2018 01:56:38 EDT Ventricular Rate:  93 PR Interval:    QRS Duration: 123 QT Interval:  408 QTC Calculation: 508 R Axis:   65 Text Interpretation:  Sinus rhythm Probable left atrial enlargement Right bundle branch block Baseline wander in lead(s) V5 No significant change was found Confirmed by Azalia Bilisampos, Kosta Schnitzler ([id number]) on 02/20/2018 2:11:10 AM   Radiology Dg Chest 1 View  Result Date: 02/20/2018 CLINICAL DATA:  Preop left hip fracture. EXAM: CHEST  1 VIEW COMPARISON:  11/16/2017 FINDINGS: Top-normal size heart. Aortic atherosclerosis is noted. No pulmonary consolidation, effusion or pneumothorax. Skin fold artifacts suspected along the periphery the right lung base. No acute osseous abnormality. IMPRESSION: No active disease. Electronically Signed   By: Tollie Ethavid  Kwon M.D.   On: 02/20/2018 02:23    Procedures Procedures (including critical care time)  Medications Ordered in ED Medications  morphine 4 MG/ML injection 4 mg (has no administration in time range)     Initial Impression / Assessment and Plan / ED Course  I have reviewed the triage vital signs and the nursing notes.  Pertinent labs & imaging results that were available during my care of the patient were reviewed by me and considered in my medical decision making (see chart for details).     Left intertrochanteric hip fracture.  Patient will be admitted to the hospitalist service.  Orthopedic consultation.  No head trauma noted.  Labs  pending.  Consultations:  Orthopedic: Dr Fredrik Coveoger [facility]  Admit to the hospital  Final Clinical Impressions(s) / ED Diagnoses   Final diagnoses:  Closed left hip fracture, initial encounter [facility](HCC)    ED Discharge Orders    None       Azalia Bilisampos, Evella Kasal, MD 02/20/18 [phone]

## 2018-02-20 NOTE — Progress Notes (Signed)
Dr. Jena GaussHaddix at bedside. Unable to contact family. Spoke to Tyson FoodsMariom, Charity fundraiserN on 5 N. Per Dr. Jena GaussHaddix will keep patient NPO. Requested that they attempt to contact family once every hour.

## 2018-02-20 NOTE — Progress Notes (Signed)
Attempted to contact emergency contact again without success.

## 2018-02-20 NOTE — ED Triage Notes (Signed)
Pt bib [facility] from [facility].  Unknown how pt fell.  Facility did an x-ray showing left hip fx.  Pt given 150 mcg of fentanyl en route.

## 2018-02-20 NOTE — Progress Notes (Signed)
RN called pt sister Darl PikesSusan and Niece [name] did not get an answer will try back again in an hour.

## 2018-02-20 NOTE — Progress Notes (Signed)
Attempted to contact emergency contact again with no answer.

## 2018-02-21 ENCOUNTER — Encounter (HOSPITAL_COMMUNITY)
Admission: EM | Disposition: A | Discharge status: Skilled Nursing Facility | Payer: Self-pay | Source: Skilled Nursing Facility | Attending: Internal Medicine

## 2018-02-21 ENCOUNTER — Inpatient Hospital Stay (HOSPITAL_COMMUNITY): Payer: Medicare Other

## 2018-02-21 ENCOUNTER — Encounter (HOSPITAL_COMMUNITY): Payer: Self-pay | Admitting: Certified Registered Nurse Anesthetist

## 2018-02-21 ENCOUNTER — Inpatient Hospital Stay (HOSPITAL_COMMUNITY): Payer: Medicare Other | Admitting: Certified Registered Nurse Anesthetist

## 2018-02-21 DIAGNOSIS — F0390 Unspecified dementia without behavioral disturbance: Secondary | ICD-10-CM

## 2018-02-21 DIAGNOSIS — I10 Essential (primary) hypertension: Secondary | ICD-10-CM

## 2018-02-21 HISTORY — PX: INTRAMEDULLARY (IM) NAIL INTERTROCHANTERIC: SHX5875

## 2018-02-21 LAB — BASIC METABOLIC PANEL
ANION GAP: 9 (ref 5–15)
BUN: 10 mg/dL (ref 8–23)
CO2: 25 mmol/L (ref 22–32)
CREATININE: 1.08 mg/dL (ref 0.61–1.24)
Calcium: 8.7 mg/dL — ABNORMAL LOW (ref 8.9–10.3)
Chloride: 106 mmol/L (ref 98–111)
Glucose, Bld: 179 mg/dL — ABNORMAL HIGH (ref 70–99)
Potassium: 3.8 mmol/L (ref 3.5–5.1)
SODIUM: 140 mmol/L (ref 135–145)

## 2018-02-21 LAB — CBC
HCT: 31.6 % — ABNORMAL LOW (ref 39.0–52.0)
Hemoglobin: 10.3 g/dL — ABNORMAL LOW (ref 13.0–17.0)
MCH: 31.7 pg (ref 26.0–34.0)
MCHC: 32.6 g/dL (ref 30.0–36.0)
MCV: 97.2 fL (ref 78.0–100.0)
PLATELETS: 195 10*3/uL (ref 150–400)
RBC: 3.25 MIL/uL — ABNORMAL LOW (ref 4.22–5.81)
RDW: 12.7 % (ref 11.5–15.5)
WBC: 8.1 10*3/uL (ref 4.0–10.5)

## 2018-02-21 LAB — GLUCOSE, CAPILLARY
GLUCOSE-CAPILLARY: 132 mg/dL — AB (ref 70–99)
GLUCOSE-CAPILLARY: 162 mg/dL — AB (ref 70–99)
Glucose-Capillary: 201 mg/dL — ABNORMAL HIGH (ref 70–99)
Glucose-Capillary: 150 mg/dL — ABNORMAL HIGH (ref 70–99)
Glucose-Capillary: 149 mg/dL — ABNORMAL HIGH (ref 70–99)
Glucose-Capillary: 197 mg/dL — ABNORMAL HIGH (ref 70–99)
Glucose-Capillary: 345 mg/dL — ABNORMAL HIGH (ref 70–99)

## 2018-02-21 SURGERY — FIXATION, FRACTURE, INTERTROCHANTERIC, WITH INTRAMEDULLARY ROD
Anesthesia: General | Laterality: Left

## 2018-02-21 MED ORDER — METOCLOPRAMIDE HCL 5 MG/ML IJ SOLN: 5.0000 mg | Freq: Three times a day (TID) | INTRAMUSCULAR | Status: DC | PRN

## 2018-02-21 MED ORDER — DOCUSATE SODIUM 100 MG PO CAPS
100.0000 mg | ORAL_CAPSULE | Freq: Two times a day (BID) | ORAL | Status: DC
  Administered 2018-02-21 – 2018-02-23 (×4): 100 mg via ORAL
  Filled 2018-02-21 (×5): qty 1

## 2018-02-21 MED ORDER — LIDOCAINE 2% (20 MG/ML) 5 ML SYRINGE
INTRAMUSCULAR | Status: AC
  Filled 2018-02-21: qty 5

## 2018-02-21 MED ORDER — LIDOCAINE 2% (20 MG/ML) 5 ML SYRINGE
INTRAMUSCULAR | Status: DC | PRN
  Administered 2018-02-21: 100 mg via INTRAVENOUS

## 2018-02-21 MED ORDER — PROPOFOL 10 MG/ML IV BOLUS
INTRAVENOUS | Status: DC | PRN
  Administered 2018-02-21: 120 mg via INTRAVENOUS

## 2018-02-21 MED ORDER — ASPIRIN EC 81 MG PO TBEC
81.0000 mg | DELAYED_RELEASE_TABLET | Freq: Two times a day (BID) | ORAL | Status: DC
  Administered 2018-02-21 – 2018-02-23 (×4): 81 mg via ORAL
  Filled 2018-02-21 (×4): qty 1

## 2018-02-21 MED ORDER — OXYCODONE HCL 5 MG/5ML PO SOLN: 5.0000 mg | Freq: Once | ORAL | Status: DC | PRN

## 2018-02-21 MED ORDER — ACETAMINOPHEN 500 MG PO TABS
500.0000 mg | ORAL_TABLET | Freq: Four times a day (QID) | ORAL | Status: DC
  Administered 2018-02-21 – 2018-02-22 (×3): 500 mg via ORAL
  Filled 2018-02-21 (×3): qty 1

## 2018-02-21 MED ORDER — DEXAMETHASONE SODIUM PHOSPHATE 10 MG/ML IJ SOLN
INTRAMUSCULAR | Status: AC
  Filled 2018-02-21: qty 1

## 2018-02-21 MED ORDER — HYDROCODONE-ACETAMINOPHEN 7.5-325 MG PO TABS: 1.0000 | ORAL_TABLET | ORAL | Status: DC | PRN

## 2018-02-21 MED ORDER — FENTANYL CITRATE (PF) 100 MCG/2ML IJ SOLN
25.0000 ug | Freq: Once | INTRAMUSCULAR | Status: AC
  Administered 2018-02-21: 25 ug via INTRAVENOUS

## 2018-02-21 MED ORDER — FENTANYL CITRATE (PF) 100 MCG/2ML IJ SOLN: 25.0000 ug | INTRAMUSCULAR | Status: DC | PRN

## 2018-02-21 MED ORDER — ADULT MULTIVITAMIN W/MINERALS CH
1.0000 | ORAL_TABLET | Freq: Every day | ORAL | Status: DC
  Administered 2018-02-22 – 2018-02-23 (×2): 1 via ORAL
  Filled 2018-02-21 (×2): qty 1

## 2018-02-21 MED ORDER — PROPOFOL 10 MG/ML IV BOLUS
INTRAVENOUS | Status: AC
  Filled 2018-02-21: qty 20

## 2018-02-21 MED ORDER — OXYCODONE HCL 5 MG PO TABS: 5.0000 mg | ORAL_TABLET | Freq: Once | ORAL | Status: DC | PRN

## 2018-02-21 MED ORDER — FENTANYL CITRATE (PF) 100 MCG/2ML IJ SOLN
INTRAMUSCULAR | Status: AC
  Administered 2018-02-21: 25 ug via INTRAVENOUS
  Filled 2018-02-21: qty 2

## 2018-02-21 MED ORDER — INSULIN ASPART 100 UNIT/ML ~~LOC~~ SOLN
5.0000 [IU] | Freq: Once | SUBCUTANEOUS | Status: AC
  Administered 2018-02-21: 5 [IU] via SUBCUTANEOUS

## 2018-02-21 MED ORDER — ONDANSETRON HCL 4 MG/2ML IJ SOLN
INTRAMUSCULAR | Status: DC | PRN
  Administered 2018-02-21: 4 mg via INTRAVENOUS

## 2018-02-21 MED ORDER — ONDANSETRON HCL 4 MG/2ML IJ SOLN: 4.0000 mg | Freq: Four times a day (QID) | INTRAMUSCULAR | Status: DC | PRN

## 2018-02-21 MED ORDER — FENTANYL CITRATE (PF) 250 MCG/5ML IJ SOLN
INTRAMUSCULAR | Status: AC
  Filled 2018-02-21: qty 5

## 2018-02-21 MED ORDER — MORPHINE SULFATE (PF) 2 MG/ML IV SOLN: 0.5000 mg | INTRAVENOUS | Status: DC | PRN

## 2018-02-21 MED ORDER — DEXAMETHASONE SODIUM PHOSPHATE 10 MG/ML IJ SOLN
INTRAMUSCULAR | Status: DC | PRN
  Administered 2018-02-21: 5 mg via INTRAVENOUS

## 2018-02-21 MED ORDER — ACETAMINOPHEN 325 MG PO TABS
325.0000 mg | ORAL_TABLET | Freq: Four times a day (QID) | ORAL | Status: DC | PRN
  Administered 2018-02-23: 650 mg via ORAL
  Filled 2018-02-21: qty 2

## 2018-02-21 MED ORDER — HYDROCODONE-ACETAMINOPHEN 5-325 MG PO TABS
1.0000 | ORAL_TABLET | ORAL | Status: DC | PRN
  Administered 2018-02-21: 1 via ORAL
  Administered 2018-02-22 – 2018-02-23 (×2): 2 via ORAL
  Filled 2018-02-21: qty 2
  Filled 2018-02-21: qty 1
  Filled 2018-02-21: qty 2

## 2018-02-21 MED ORDER — CEFAZOLIN SODIUM-DEXTROSE 2-3 GM-%(50ML) IV SOLR
INTRAVENOUS | Status: DC | PRN
  Administered 2018-02-21: 2 g via INTRAVENOUS

## 2018-02-21 MED ORDER — FENTANYL CITRATE (PF) 100 MCG/2ML IJ SOLN
INTRAMUSCULAR | Status: DC | PRN
  Administered 2018-02-21 (×2): 25 ug via INTRAVENOUS
  Administered 2018-02-21 (×2): 50 ug via INTRAVENOUS

## 2018-02-21 MED ORDER — MEPERIDINE HCL 50 MG/ML IJ SOLN: 6.2500 mg | INTRAMUSCULAR | Status: DC | PRN

## 2018-02-21 MED ORDER — METHOCARBAMOL 500 MG PO TABS
500.0000 mg | ORAL_TABLET | Freq: Four times a day (QID) | ORAL | Status: DC | PRN
  Administered 2018-02-22 – 2018-02-23 (×2): 500 mg via ORAL
  Filled 2018-02-21 (×2): qty 1

## 2018-02-21 MED ORDER — ROCURONIUM BROMIDE 50 MG/5ML IV SOLN
INTRAVENOUS | Status: AC
  Filled 2018-02-21: qty 1

## 2018-02-21 MED ORDER — METHOCARBAMOL 1000 MG/10ML IJ SOLN
500.0000 mg | Freq: Four times a day (QID) | INTRAVENOUS | Status: DC | PRN
  Filled 2018-02-21: qty 5

## 2018-02-21 MED ORDER — ONDANSETRON HCL 4 MG PO TABS: 4.0000 mg | ORAL_TABLET | Freq: Four times a day (QID) | ORAL | Status: DC | PRN

## 2018-02-21 MED ORDER — GLUCERNA SHAKE PO LIQD
237.0000 mL | Freq: Three times a day (TID) | ORAL | Status: DC
  Administered 2018-02-21 – 2018-02-23 (×4): 237 mL via ORAL

## 2018-02-21 MED ORDER — ONDANSETRON HCL 4 MG/2ML IJ SOLN
INTRAMUSCULAR | Status: AC
  Filled 2018-02-21: qty 2

## 2018-02-21 MED ORDER — CEFAZOLIN SODIUM 1 G IJ SOLR
INTRAMUSCULAR | Status: AC
  Filled 2018-02-21: qty 20

## 2018-02-21 MED ORDER — METOCLOPRAMIDE HCL 5 MG PO TABS: 5.0000 mg | ORAL_TABLET | Freq: Three times a day (TID) | ORAL | Status: DC | PRN

## 2018-02-21 MED ORDER — KETOROLAC TROMETHAMINE 15 MG/ML IJ SOLN
7.5000 mg | Freq: Four times a day (QID) | INTRAMUSCULAR | Status: DC
  Administered 2018-02-21 – 2018-02-22 (×3): 7.5 mg via INTRAVENOUS
  Filled 2018-02-21 (×3): qty 1

## 2018-02-21 SURGICAL SUPPLY — 41 items
ADH SKN CLS APL DERMABOND .7 (GAUZE/BANDAGES/DRESSINGS) ×1
ALCOHOL 70% 16 OZ (MISCELLANEOUS) ×3 IMPLANT
BIT DRILL FLUTED FEMUR 4.2/3 (BIT) ×2 IMPLANT
BNDG COHESIVE 6X5 TAN STRL LF (GAUZE/BANDAGES/DRESSINGS) ×6 IMPLANT
CANISTER SUCTION WELLS/JOHNSON (MISCELLANEOUS) ×3 IMPLANT
COVER PERINEAL POST (MISCELLANEOUS) ×3 IMPLANT
COVER SURGICAL LIGHT HANDLE (MISCELLANEOUS) ×3 IMPLANT
DERMABOND ADVANCED (GAUZE/BANDAGES/DRESSINGS) ×2
DERMABOND ADVANCED .7 DNX12 (GAUZE/BANDAGES/DRESSINGS) IMPLANT
DRAPE HALF SHEET 40X57 (DRAPES) IMPLANT
DRAPE INCISE IOBAN 66X45 STRL (DRAPES) ×3 IMPLANT
DRAPE STERI IOBAN 125X83 (DRAPES) ×3 IMPLANT
DRSG ADAPTIC 3X8 NADH LF (GAUZE/BANDAGES/DRESSINGS) ×3 IMPLANT
DRSG MEPILEX BORDER 4X4 (GAUZE/BANDAGES/DRESSINGS) ×2 IMPLANT
DURAPREP 26ML APPLICATOR (WOUND CARE) ×3 IMPLANT
ELECT CAUTERY BLADE 6.4 (BLADE) ×3 IMPLANT
ELECT REM PT RETURN 9FT ADLT (ELECTROSURGICAL) ×3
ELECTRODE REM PT RTRN 9FT ADLT (ELECTROSURGICAL) ×1 IMPLANT
GAUZE SPONGE 4X4 12PLY STRL LF (GAUZE/BANDAGES/DRESSINGS) ×3 IMPLANT
GAUZE XEROFORM 1X8 LF (GAUZE/BANDAGES/DRESSINGS) ×2 IMPLANT
GLOVE BIO SURGEON STRL SZ7.5 (GLOVE) ×3 IMPLANT
GLOVE BIOGEL PI IND STRL 8 (GLOVE) ×1 IMPLANT
GLOVE BIOGEL PI INDICATOR 8 (GLOVE) ×2
GOWN STRL REUS W/ TWL LRG LVL3 (GOWN DISPOSABLE) ×1 IMPLANT
GOWN STRL REUS W/ TWL XL LVL3 (GOWN DISPOSABLE) ×1 IMPLANT
GOWN STRL REUS W/TWL LRG LVL3 (GOWN DISPOSABLE) ×3
GOWN STRL REUS W/TWL XL LVL3 (GOWN DISPOSABLE) ×3
GUIDEWIRE 3.2X400 (WIRE) ×4 IMPLANT
KIT BASIN OR (CUSTOM PROCEDURE TRAY) ×3 IMPLANT
KIT TURNOVER KIT B (KITS) ×3 IMPLANT
NAIL TROCH FIX 10X235 LT 130 (Nail) ×2 IMPLANT
NS IRRIG 1000ML POUR BTL (IV SOLUTION) ×3 IMPLANT
PACK GENERAL/GYN (CUSTOM PROCEDURE TRAY) ×3 IMPLANT
PAD ARMBOARD 7.5X6 YLW CONV (MISCELLANEOUS) ×6 IMPLANT
SCREW LOCKING 5.0X34MM (Screw) ×2 IMPLANT
SCREW TFNA P5MM STERILE (Screw) ×2 IMPLANT
STAPLER VISISTAT 35W (STAPLE) ×3 IMPLANT
SUT MON AB 2-0 CT1 36 (SUTURE) ×3 IMPLANT
TOWEL OR 17X24 6PK STRL BLUE (TOWEL DISPOSABLE) ×3 IMPLANT
TOWEL OR 17X26 10 PK STRL BLUE (TOWEL DISPOSABLE) ×3 IMPLANT
WATER STERILE IRR 1000ML POUR (IV SOLUTION) ×3 IMPLANT

## 2018-02-21 NOTE — Anesthesia Preprocedure Evaluation (Addendum)
Anesthesia Evaluation  Patient identified by MRN, date of birth, ID band Patient awake    Reviewed: Allergy & Precautions, H&P , NPO status , Patient's Chart, lab work & pertinent test results, reviewed documented beta blocker date and time   Airway Mallampati: II  TM Distance: >3 FB Neck ROM: full    Dental no notable dental hx. (+) Edentulous Upper, Partial Upper, Poor Dentition, Missing,    Pulmonary former smoker,    Pulmonary exam normal breath sounds clear to auscultation       Cardiovascular Exercise Tolerance: Good hypertension, Pt. on medications  Rhythm:regular Rate:Normal     Neuro/Psych Dementia    GI/Hepatic   Endo/Other  diabetes, Oral Hypoglycemic Agents  Renal/GU CRFRenal disease  negative genitourinary   Musculoskeletal   Abdominal   Peds  Hematology  (+) anemia ,   Anesthesia Other Findings   Reproductive/Obstetrics                           Anesthesia Physical Anesthesia Plan  ASA: III  Anesthesia Plan: General   Post-op Pain Management:    Induction:   PONV Risk Score and Plan: 2 and Ondansetron and Treatment may vary due to age or medical condition  Airway Management Planned: LMA and Oral ETT  Additional Equipment:   Intra-op Plan:   Post-operative Plan:   Informed Consent: I have reviewed the patients History and Physical, chart, labs and discussed the procedure including the risks, benefits and alternatives for the proposed anesthesia with the patient or authorized representative who has indicated his/her understanding and acceptance.   Dental Advisory Given  Plan Discussed with: CRNA, Anesthesiologist and Surgeon  Anesthesia Plan Comments:         Anesthesia Quick Evaluation

## 2018-02-21 NOTE — Brief Op Note (Signed)
02/21/2018  2:49 PM  PATIENT:  Richard Powers  72 y.o. male  PRE-OPERATIVE DIAGNOSIS:  left intertrochanteric hip fracture  POST-OPERATIVE DIAGNOSIS:  left intertrochanteric hip fracture  PROCEDURE:  Procedure(s): INTRAMEDULLARY (IM) NAIL INTERTROCHANTRIC (Left)  SURGEON:  Surgeon(s) and Role:    * Yolonda Kidaogers, Rachell Druckenmiller Patrick, MD - Primary  PHYSICIAN ASSISTANT:   ASSISTANTS: none   ANESTHESIA:   general  EBL:  100 mL   BLOOD ADMINISTERED:none  DRAINS: none   LOCAL MEDICATIONS USED:  NONE  SPECIMEN:  No Specimen  DISPOSITION OF SPECIMEN:  N/A  COUNTS:  YES  TOURNIQUET:  * No tourniquets in log *  DICTATION: .Note written in EPIC  PLAN OF CARE: Admit to inpatient   PATIENT DISPOSITION:  PACU - hemodynamically stable.   Delay start of Pharmacological VTE agent (>24hrs) due to surgical blood loss or risk of bleeding: not applicable

## 2018-02-21 NOTE — Op Note (Signed)
Date of Surgery: 02/21/2018  INDICATIONS: Mr. Richard Powers is a 72 y.o.-year-old male who sustained a left hip fracture. The risks and benefits of the procedure discussed with the patient and Health care power of attorney prior to the procedure and all questions were answered; consent was obtained.  Due to the patient's advanced dementia we did have to obtain consent from his healthcare power of attorney.  PREOPERATIVE DIAGNOSIS: left hip fracture   POSTOPERATIVE DIAGNOSIS: Same   PROCEDURE: Treatment of intertrochanteric, pertrochanteric, subtrochanteric fracture with intramedullary implant. CPT [phone]   SURGEON: Kathi Der. Aundria Rud, M.D.   ANESTHESIA: general   IV FLUIDS AND URINE: See anesthesia record   ESTIMATED BLOOD LOSS: 100 cc.  IMPLANTS: Synthes TFN a Intermediate length.  10 mm diameter. 95 mm proximal compression screw. 5.0 x 34 mm distal interlock screw  DRAINS: None.   COMPLICATIONS: None.   DESCRIPTION OF PROCEDURE: The patient was brought to the operating room and placed supine on the operating table. The patient's leg had been signed prior to the procedure. The patient had the anesthesia placed by the anesthesiologist. The prep verification and incision time-outs were performed to confirm that this was the correct patient, site, side and location. The patient had an SCD on the opposite lower extremity. The patient did receive antibiotics prior to the incision and was re-dosed during the procedure as needed at indicated intervals. The patient was positioned on the fracture table with the table in traction and internal rotation to reduce the hip. The well leg was placed in a scissor position and all bony prominences were well-padded. The patient had the lower extremity prepped and draped in the standard surgical fashion. The incision was made 4 finger breadths superior to the greater trochanter. A guide pin was inserted into the tip of the greater trochanter under fluoroscopic  guidance. An opening reamer was used to gain access to the femoral canal. The nail length was measured and inserted down the femoral canal to its proper depth. The appropriate version of insertion for the lag screw was found under fluoroscopy. A pin was inserted up the femoral neck through the jig. The length of the lag screw was then measured. The lag screw was inserted as near to center-center in the head as possible. The leg was taken out of traction, then the compression screw was used to compress across the fracture. Compression was visualized on serial xrays.   We next turned our attention to the distal interlocking screw.  This was placed through the drill guide of the nail inserter.  A small incision was made overlying the lateral thigh at the screw site, and a tonsil was used to disect down to bone.  A drill pass was made through the jig and across the nail through both cortices.  This was measured, and the appropriate screw was placed under hand power and found to have good bite.    The wound was copiously irrigated with saline and the subcutaneous layer closed with 2.0 vicryl and the skin was reapproximated with staples. The wounds were cleaned and dried a final time and a sterile dressing was placed. The hip was taken through a range of motion at the end of the case under fluoroscopic imaging to visualize the approach-withdraw phenomenon and confirm implant length in the head. The patient was then awakened from anesthesia and taken to the recovery room in stable condition. All counts were correct at the end of the case.   POSTOPERATIVE PLAN: The patient  will be weight bearing as tolerated and will return in 2 weeks for staple removal and the patient will receive DVT prophylaxis based on other medications, activity level, and risk ratio of bleeding to thrombosis.  He will return to the medicine service post op for continuation of his inpatient care.   Maryan RuedJason P Sanika Brosious, MD [facility] [phone] 2:49 PM

## 2018-02-21 NOTE — Progress Notes (Signed)
CBG 345- Schorr,NP paged- orders given.

## 2018-02-21 NOTE — Progress Notes (Addendum)
Initial Nutrition Assessment  DOCUMENTATION CODES:   Not applicable  INTERVENTION:   -Once diet is advanced:  -Glucerna Shake po TID, each supplement provides 220 kcal and 10 grams of protein -MVI with minerals daily -Magic Cup TID with meals, each supplement provides 290 kcals and 9 grams protein -Feeding assistance with meals  NUTRITION DIAGNOSIS:   Increased nutrient needs related to post-op healing as evidenced by estimated needs.  GOAL:   Patient will meet greater than or equal to 90% of their needs  MONITOR:   PO intake, Supplement acceptance, Diet advancement, Labs, Weight trends, Skin, I & O's  REASON FOR ASSESSMENT:   Malnutrition Screening Tool, Consult Assessment of nutrition requirement/status  ASSESSMENT:   Richard Powers is a 72 y.o. male with history of dementia, diabetes mellitus type 2, anemia, chronic kidney disease, hyperlipidemia was brought to the ER the patient had an unwitnessed fall at the nursing facility.  Not much history is available as patient has advanced dementia.  Pt admitted with closed lt hip fx. Plan for OR today if able ot obtain consent.   Spoke with pt at bedside; unable to obtain accurate hx due to dementia. Pt reports he was eating well PTA, however, RD unable to confirm this statement.   Reviewed wt hx; pt has experienced a 14.7% wt loss over the past 6 months, which is significant for time frame.   Limited nutrition-focused physical exam completed. Pt was yelling out during most of visit and refused to let this RD assess leg region and thoracic region secondary to pain.   Last Hgb A1c: 9.6 (11/17/08). PTA DM medications are 3 units insulin regular TID with meals and 500 mg metformin BID).   PTA medications include megace.   Labs reviewed: CBGS: 122-201 (inpatient orders for glycemic control are 0-9 units insulin aspart TID with meals).   NUTRITION - FOCUSED PHYSICAL EXAM:    Most Recent Value  Orbital Region  No  depletion  Upper Arm Region  No depletion  Thoracic and Lumbar Region  Unable to assess  Buccal Region  Mild depletion  Temple Region  No depletion  Clavicle Bone Region  No depletion  Clavicle and Acromion Bone Region  No depletion  Scapular Bone Region  No depletion  Dorsal Hand  Mild depletion  Patellar Region  Unable to assess  Anterior Thigh Region  Unable to assess  Posterior Calf Region  Unable to assess  Edema (RD Assessment)  None  Hair  Reviewed  Eyes  Reviewed  Mouth  Reviewed  Skin  Reviewed  Nails  Reviewed       Diet Order:   Diet Order           Diet NPO time specified  Diet effective midnight          EDUCATION NEEDS:   Not appropriate for education at this time  Skin:  Skin Assessment: Reviewed RN Assessment  Last BM:  PTA  Height:   Ht Readings from Last 1 Encounters:  02/21/18 5\' 7"  (1.702 m)    Weight:   Wt Readings from Last 1 Encounters:  02/21/18 145 lb 4.5 oz (65.9 kg)    Ideal Body Weight:  67.3 kg  BMI:  Body mass index is 22.75 kg/m.  Estimated Nutritional Needs:   Kcal:  1800-2000  Protein:  90-105 grams  Fluid:  1.8-2.0 L    Richard Powers, RD, LDN, CDE Pager: [phone] After hours Pager: [phone]

## 2018-02-21 NOTE — Consult Note (Signed)
Orthopaedic  [facility] [place]  Patient ID: Richard Powers MRN: [id number] DOB/AGE: March 24, 1946 71 y.o.    HPI: Richard Powers is an 72 y.o. male who is being seen in consultation for evaluation of left intertrochanteric femur fracture.  Patient had unwitnessed fall at his nursing facility.  He had immediate pain and inability to bear weight.  X-rays at the facility showed a intertrochanteric femur fracture.  He was brought to [facility].  Patient has advanced dementia and is able to provide significant history.  Patient knows he is in the hospital he does not know what year or what hospital he is in.  Currently complains of only left hip pain.  Unable to perform any other history due to his dementia.    He was tentatively scheduled for operative fixation yesterday.  Unfortunately, he was not consentable and had no immediate family or power of attorney available for that  consent.  He has since then been able to be consented via his healthcare power of attorney and is now ready for surgery.  Past Medical History:  Diagnosis Date  . Diabetes mellitus without complication (HCC)   . Hypertension     History reviewed. No pertinent surgical history.  Family History  Family history unknown: Yes    Social History:  reports that he has quit smoking. He has never used smokeless tobacco. He reports that he drinks alcohol. He reports that he does not use drugs.  Allergies: No Known Allergies  Medications:  No current facility-administered medications on file prior to encounter.    Current Outpatient Medications on File Prior to Encounter  Medication Sig Dispense Refill  . acetaminophen (TYLENOL) 500 MG tablet Take 1 tablet (500 mg total) by mouth every 6 (six) hours as needed. (Patient taking differently: Take 500 mg by mouth every 6 (six) hours as needed for mild pain. ) 30 tablet 0  . donepezil (ARICEPT) 10 MG tablet Take 1 tablet (10 mg total) by  mouth at bedtime. 30 tablet 3  . feeding supplement, ENSURE ENLIVE, (ENSURE ENLIVE) LIQD Take 237 mLs by mouth 2 (two) times daily between meals. 237 mL 12  . hydrocerin (EUCERIN) CREA Apply 1 application topically 2 (two) times daily.  0  . insulin regular (NOVOLIN R,HUMULIN R) 100 units/mL injection Inject 2-8 Units into the skin See admin instructions. Per sliding scale: 201-250 give 2 units 251-300 give 4 units 301-350 give 6 units 351-400 give 8 units    . megestrol (MEGACE) 400 MG/10ML suspension Take 10 mLs (400 mg total) by mouth 2 (two) times daily. 240 mL 0  . metFORMIN (GLUCOPHAGE) 500 MG tablet Take 500 mg by mouth 2 (two) times daily with a meal.    . Nutritional Supplements (NUTRITIONAL DRINK PO) Take 1 Container by mouth 2 (two) times daily. Mighty Shake    . polyvinyl alcohol (LIQUIFILM TEARS) 1.4 % ophthalmic solution Place 1 drop into both eyes as needed for dry eyes. 15 mL 0  . sertraline (ZOLOFT) 25 MG tablet Take 25 mg by mouth daily.    . simvastatin (ZOCOR) 40 MG tablet Take 1 tablet (40 mg total) by mouth daily. 30 tablet 0  . thiamine 100 MG tablet Take 1 tablet (100 mg total) by mouth daily.       ROS: Unable to perform due to his advanced dementia  Exam: Blood pressure (!) 164/85, pulse 82, temperature 98.2 F (36.8 C), temperature source Oral, resp. rate 18, height 5'  7" (1.702 m), weight 65.9 kg (145 lb 4.5 oz), SpO2 98 %. General: Awake and alert no acute distress Orientation: Oriented to the fact that he is in the hospital.  No further orientation other to himself Mood and Affect: Cooperative Gait: Unable to assess due to his fracture and being bedbound  Left lower extremity: Skin without lesions.  Unable to perform any range of motion the patient screams out in pain.  Leg is shortened and externally rotated.  Patient has active EHL, FHL, gastrocsoleus and tibialis anterior motor function.  Sensation is grossly intact.  He has a warm well-perfused foot.  No  obvious deformities about the ankle or knee.  Right lower extremity: Skin without lesions. No tenderness to palpation. Full painless ROM, full strength in each muscle groups without evidence of instability.   Medical Decision Making: Imaging: X-rays of the pelvis and left hip show a 2 part left intertrochanteric femur fracture with a significant external rotation of the femoral shaft.  Labs:  Results for orders placed or performed during the hospital encounter of 02/20/18 (from the past 24 hour(s))  Glucose, capillary     Status: Abnormal   Collection Time: 02/20/18  4:54 PM  Result Value Ref Range   Glucose-Capillary 132 (H) 70 - 99 mg/dL  Glucose, capillary     Status: Abnormal   Collection Time: 02/20/18  9:06 PM  Result Value Ref Range   Glucose-Capillary 201 (H) 70 - 99 mg/dL   Comment 1 Notify RN   CBC     Status: Abnormal   Collection Time: 02/21/18  6:58 AM  Result Value Ref Range   WBC 8.1 4.0 - 10.5 K/uL   RBC 3.25 (L) 4.22 - 5.81 MIL/uL   Hemoglobin 10.3 (L) 13.0 - 17.0 g/dL   HCT 29.5 (L) 28.4 - 13.2 %   MCV 97.2 78.0 - 100.0 fL   MCH 31.7 26.0 - 34.0 pg   MCHC 32.6 30.0 - 36.0 g/dL   RDW 44.0 10.2 - 72.5 %   Platelets 195 150 - 400 K/uL  Basic metabolic panel     Status: Abnormal   Collection Time: 02/21/18  6:58 AM  Result Value Ref Range   Sodium 140 135 - 145 mmol/L   Potassium 3.8 3.5 - 5.1 mmol/L   Chloride 106 98 - 111 mmol/L   CO2 25 22 - 32 mmol/L   Glucose, Bld 179 (H) 70 - 99 mg/dL   BUN 10 8 - 23 mg/dL   Creatinine, Ser 3.66 0.61 - 1.24 mg/dL   Calcium 8.7 (L) 8.9 - 10.3 mg/dL   GFR calc non Af Amer >60 >60 mL/min   GFR calc Af Amer >60 >60 mL/min   Anion gap 9 5 - 15  Glucose, capillary     Status: Abnormal   Collection Time: 02/21/18 10:23 AM  Result Value Ref Range   Glucose-Capillary 162 (H) 70 - 99 mg/dL  Glucose, capillary     Status: Abnormal   Collection Time: 02/21/18 12:39 PM  Result Value Ref Range   Glucose-Capillary 150 (H) 70 -  99 mg/dL   Comment 1 Notify RN    Comment 2 Document in Chart     Medical history and chart was reviewed  Assessment/Plan: 72 year old male with a history of advanced dementia, diabetes type 2 on insulin along with chronic anemia and chronic kidney disease with left intertrochanteric femur fracture  -Patient is indicated for operative fixation of left intertrochanteric hip fracture.  We will  manage this with intramedullary nail.  Patient is very demented and confused and not able to understand my correspondence.  The consent however has been obtained via his healthcare power of attorney.  All questions answered and solicited through her.  Yolonda KidaJason Patrick Rogers

## 2018-02-21 NOTE — Discharge Instructions (Signed)
Orthopedic discharge instructions:  -Okay for full weightbearing as tolerated to the left lower extremity -Maintain postoperative bandages unless they become saturated.  In that case changed daily to dry dressing. -For prevention of blood clot take an 81 mg aspirin twice daily for 6 weeks. -Return to see Dr. Aundria Rudogers in 2 weeks for wound check and routine follow-up.

## 2018-02-21 NOTE — Progress Notes (Signed)
Plan for surgery this afternoon, if able to get consent.  Keep NPO.

## 2018-02-21 NOTE — Anesthesia Postprocedure Evaluation (Signed)
Anesthesia Post Note  Patient: Craig GuessWilliam Thomas Tadlock III  Procedure(s) Performed: INTRAMEDULLARY (IM) NAIL INTERTROCHANTRIC (Left )     Patient location during evaluation: PACU Anesthesia Type: General Level of consciousness: awake and alert Pain management: pain level controlled Vital Signs Assessment: post-procedure vital signs reviewed and stable Respiratory status: spontaneous breathing, nonlabored ventilation, respiratory function stable and patient connected to nasal cannula oxygen Cardiovascular status: blood pressure returned to baseline and stable Postop Assessment: no apparent nausea or vomiting Anesthetic complications: no    Last Vitals:  Vitals:   02/21/18 1501 02/21/18 1506  BP: 125/84 (!) 163/93  Pulse: 78 72  Resp: 10 14  Temp:    SpO2: 93% 92%    Last Pain:  Vitals:   02/21/18 1114  TempSrc:   PainSc: 4                  Ashari Llewellyn

## 2018-02-21 NOTE — Progress Notes (Signed)
Progress Note    Richard Powers  ZOX:[id number] DOB: 10/20/1945  DOA: 02/20/2018 PCP: [name], MD    Brief Narrative:    Medical records reviewed and are as summarized below:  Richard Powers is an 72 y.o. male with history of dementia, diabetes mellitus type 2, anemia, chronic kidney disease, hyperlipidemia was brought to the ER the patient had an unwitnessed fall at the nursing facility.  Not much history is available as patient has advanced dementia.    Assessment/Plan:   Principal Problem:   Closed left hip fracture, initial encounter [facility]) Active Problems:   Essential hypertension   DM (diabetes mellitus), type 2 with renal complications (HCC)   Dementia   CKD (chronic kidney disease) stage 2, GFR 60-89 ml/min   Closed left hip fracture (HCC)  Left hip fracture  -circumstances of patient's fall is unknown. -had surgery on 6/25 when consent was able to be obtained from family  Hypertension  -PRN IV hydralazine  Diabetes mellitus type 2 - -SSI -resume orals once eating consistently  Dementia  -severe -from SNF -needs palliative care eval at SNF  Chronic kidney disease stage II appears to be at baseline.  Chronic anemia likely from renal disease -hemoglobin appears to be at baseline.      Family Communication/Anticipated D/C date and plan/Code Status   DVT prophylaxis: scd Code Status: Full Code.  Family Communication: no family at bedside Disposition Plan: back to SNF   Medical Consultants:    None.   Subjective:   Speaking in a high pitched voice  Objective:    Vitals:   02/21/18 1446 02/21/18 1500 02/21/18 1501 02/21/18 1506  BP: (!) 182/100 125/84 125/84 (!) 163/93  Pulse: (!) 115 85 78 72  Resp: (!) 6 14 10 14   Temp: 97.6 F (36.4 C)     TempSrc:      SpO2: 97% 94% 93% 92%  Weight:      Height:        Intake/Output Summary (Last 24 hours) at 02/21/2018 1521 Last data filed at 02/21/2018  1510 Gross per 24 hour  Intake 700 ml  Output 1475 ml  Net -775 ml   Filed Weights   02/21/18 0945  Weight: 65.9 kg (145 lb 4.5 oz)    Exam: In bed, compulsively placing finger in nose Not oriented to person, place or time No increased work of breathing abd non-tender Answers questions in a high pitched voice  Data Reviewed:   I have personally reviewed following labs and imaging studies:  Labs: Labs show the following:   Basic Metabolic Panel: Recent Labs  Lab 02/20/18 0229 02/21/18 0658  NA 142 140  K 3.9 3.8  CL 104 106  CO2 23 25  GLUCOSE 300* 179*  BUN 16 10  CREATININE 1.29* 1.08  CALCIUM 9.2 8.7*   GFR Estimated Creatinine Clearance: 58.5 mL/min (by C-G formula based on SCr of 1.08 mg/dL). Liver Function Tests: No results for input(s): AST, ALT, ALKPHOS, BILITOT, PROT, ALBUMIN in the last 168 hours. No results for input(s): LIPASE, AMYLASE in the last 168 hours. No results for input(s): AMMONIA in the last 168 hours. Coagulation profile Recent Labs  Lab 02/20/18 0229  INR 1.15    CBC: Recent Labs  Lab 02/20/18 0229 02/21/18 0658  WBC 10.1 8.1  NEUTROABS 7.9*  --   HGB 10.5* 10.3*  HCT 32.7* 31.6*  MCV 96.2 97.2  PLT 204 195   Cardiac Enzymes: No  results for input(s): CKTOTAL, CKMB, CKMBINDEX, TROPONINI in the last 168 hours. BNP (last 3 results) No results for input(s): PROBNP in the last 8760 hours. CBG: Recent Labs  Lab 02/20/18 1654 02/20/18 2106 02/21/18 1023 02/21/18 1239 02/21/18 1459  GLUCAP 132* 201* 162* 150* 149*   D-Dimer: No results for input(s): DDIMER in the last 72 hours. Hgb A1c: No results for input(s): HGBA1C in the last 72 hours. Lipid Profile: No results for input(s): CHOL, HDL, LDLCALC, TRIG, CHOLHDL, LDLDIRECT in the last 72 hours. Thyroid function studies: No results for input(s): TSH, T4TOTAL, T3FREE, THYROIDAB in the last 72 hours.  Invalid input(s): FREET3 Anemia work up: No results for input(s):  VITAMINB12, FOLATE, FERRITIN, TIBC, IRON, RETICCTPCT in the last 72 hours. Sepsis Labs: Recent Labs  Lab 02/20/18 0229 02/21/18 0658  WBC 10.1 8.1    Microbiology Recent Results (from the past 240 hour(s))  Surgical pcr screen     Status: None   Collection Time: 02/20/18  8:10 AM  Result Value Ref Range Status   MRSA, PCR NEGATIVE NEGATIVE Final   Staphylococcus aureus NEGATIVE NEGATIVE Final    Comment: (NOTE) The Xpert SA Assay (FDA approved for NASAL specimens in patients 72 years of age and older), is one component of a comprehensive surveillance program. It is not intended to diagnose infection nor to guide or monitor treatment. Performed at [facility], [address] [place], [place] [id number]     Procedures and diagnostic studies:  Dg Chest 1 View  Result Date: 02/20/2018 CLINICAL DATA:  Preop left hip fracture. EXAM: CHEST  1 VIEW COMPARISON:  11/16/2017 FINDINGS: Top-normal size heart. Aortic atherosclerosis is noted. No pulmonary consolidation, effusion or pneumothorax. Skin fold artifacts suspected along the periphery the right lung base. No acute osseous abnormality. IMPRESSION: No active disease. Electronically Signed   By: Tollie Ethavid  Kwon M.D.   On: 02/20/2018 02:23   Ct Head Wo Contrast  Result Date: 02/20/2018 CLINICAL DATA:  Altered level of consciousness.  Fall. EXAM: CT HEAD WITHOUT CONTRAST TECHNIQUE: Contiguous axial images were obtained from the base of the skull through the vertex without intravenous contrast. COMPARISON:  CT head without contrast 09/09/2017 and 09/03/2017. FINDINGS: Brain: Atrophy and white matter changes are stable. No acute cortical infarct, hemorrhage, or mass lesion is present. No new infarcts are present. Basal ganglia are intact. Insular ribbon is normal bilaterally. Septum pellucidum is again noted. Ventricles proportionate to the degree of atrophy. The brainstem and cerebellum are normal. Vascular: Atherosclerotic calcifications  are present at the cavernous internal carotid arteries. There is no hyperdense vessel. Skull: Calvarium is intact. No focal lytic or blastic lesions are present. Sinuses/Orbits: The paranasal sinuses and mastoid air cells are clear. Globes and orbits are within normal limits. IMPRESSION: Negative CT of the head. Electronically Signed   By: Marin Robertshristopher  Mattern M.D.   On: 02/20/2018 07:13   Chest Portable 1 View  Result Date: 02/20/2018 CLINICAL DATA:  72 year old male undergoing preoperative evaluation prior to left hip surgery. EXAM: PORTABLE CHEST 1 VIEW COMPARISON:  Prior chest x-ray earlier today, 02/20/2018 FINDINGS: The lungs are clear and negative for focal airspace consolidation, pulmonary edema or suspicious pulmonary nodule. Improved inspiratory volumes a decreased bibasilar subsegmental atelectasis. No pleural effusion or pneumothorax. Cardiac and mediastinal contours are within normal limits. No acute fracture or lytic or blastic osseous lesions. The visualized upper abdominal bowel gas pattern is unremarkable. Multiple remote healed left-sided rib fractures. IMPRESSION: No active disease. Electronically Signed  By: Malachy Moan M.D.   On: 02/20/2018 08:11   Dg C-arm 1-60 Min  Result Date: 02/21/2018 CLINICAL DATA:  Left proximal femoral fracture EXAM: LEFT FEMUR 2 VIEWS; DG C-ARM 61-120 MIN COMPARISON:  None. FLUOROSCOPY TIME:  Fluoroscopy Time:  58 seconds Radiation Exposure Index (if provided by the fluoroscopic device): Not available Number of Acquired Spot Images: 4 FINDINGS: Proximal medullary rod is noted within the femur. Fixation screws noted traversing the femoral neck. The fracture fragments are in near anatomic alignment. IMPRESSION: ORIF of left femoral fracture. Electronically Signed   By: Alcide Clever M.D.   On: 02/21/2018 15:05   Dg Hip Unilat W Or Wo Pelvis 2-3 Views Left  Result Date: 02/20/2018 CLINICAL DATA:  Left hip pain after fall EXAM: DG HIP (WITH OR WITHOUT  PELVIS) 2-3V LEFT COMPARISON:  None. FINDINGS: Acute, closed, slightly distracted left intertrochanteric fracture of the femur is identified. Intact bony pelvis. No hip joint dislocation. IMPRESSION: Acute intertrochanteric fracture of the left femur. Electronically Signed   By: Tollie Eth M.D.   On: 02/20/2018 02:52   Dg Femur Min 2 Views Left  Result Date: 02/21/2018 CLINICAL DATA:  Left proximal femoral fracture EXAM: LEFT FEMUR 2 VIEWS; DG C-ARM 61-120 MIN COMPARISON:  None. FLUOROSCOPY TIME:  Fluoroscopy Time:  58 seconds Radiation Exposure Index (if provided by the fluoroscopic device): Not available Number of Acquired Spot Images: 4 FINDINGS: Proximal medullary rod is noted within the femur. Fixation screws noted traversing the femoral neck. The fracture fragments are in near anatomic alignment. IMPRESSION: ORIF of left femoral fracture. Electronically Signed   By: Alcide Clever M.D.   On: 02/21/2018 15:05    Medications:   . [MAR Hold] feeding supplement (GLUCERNA SHAKE)  237 mL Oral TID BM  . [MAR Hold] insulin aspart  0-9 Units Subcutaneous TID WC  . [MAR Hold] multivitamin with minerals  1 tablet Oral Daily   Continuous Infusions: . lactated ringers 10 mL/hr at 02/21/18 1211     LOS: 1 day   Joseph Art  [facility]   *Please refer to [email or web address], password [email or web address] to get updated schedule on who will round on this patient, as hospitalists switch teams weekly. If 7PM-7AM, please contact night-coverage at [email or web address], password [email or web address] for any overnight needs.  02/21/2018, 3:21 PM

## 2018-02-21 NOTE — Transfer of Care (Signed)
Immediate Anesthesia Transfer of Care Note  Patient: Richard Powers  Procedure(s) Performed: INTRAMEDULLARY (IM) NAIL INTERTROCHANTRIC (Left )  Patient Location: PACU  Anesthesia Type:General  Level of Consciousness: drowsy and patient cooperative  Airway & Oxygen Therapy: Patient Spontanous Breathing and Patient connected to face mask oxygen  Post-op Assessment: Report given to RN and Post -op Vital signs reviewed and stable  Post vital signs: Reviewed and stable  Last Vitals:  Vitals Value Taken Time  BP    Temp    Pulse 59 02/21/2018  2:48 PM  Resp 9 02/21/2018  2:48 PM  SpO2 98 % 02/21/2018  2:48 PM  Vitals shown include unvalidated device data.  Last Pain:  Vitals:   02/21/18 1114  TempSrc:   PainSc: 4          Complications: No apparent anesthesia complications

## 2018-02-21 NOTE — Anesthesia Procedure Notes (Signed)
Procedure Name: LMA Insertion Date/Time: 02/21/2018 1:49 PM Performed by: Pearson Grippeobertson, Tenasia Aull M, CRNA Pre-anesthesia Checklist: Patient identified, Emergency Drugs available, Suction available and Patient being monitored Patient Re-evaluated:Patient Re-evaluated prior to induction Oxygen Delivery Method: Circle system utilized Preoxygenation: Pre-oxygenation with 100% oxygen Induction Type: IV induction Ventilation: Mask ventilation without difficulty LMA: LMA inserted LMA Size: 4.0 Number of attempts: 1 Airway Equipment and Method: Bite block Placement Confirmation: positive ETCO2 Tube secured with: Tape Dental Injury: Teeth and Oropharynx as per pre-operative assessment

## 2018-02-22 ENCOUNTER — Encounter (HOSPITAL_COMMUNITY): Payer: Self-pay | Admitting: Orthopedic Surgery

## 2018-02-22 LAB — BASIC METABOLIC PANEL
ANION GAP: 10 (ref 5–15)
BUN: 23 mg/dL (ref 8–23)
CHLORIDE: 105 mmol/L (ref 98–111)
CO2: 25 mmol/L (ref 22–32)
Calcium: 8.5 mg/dL — ABNORMAL LOW (ref 8.9–10.3)
Creatinine, Ser: 1.37 mg/dL — ABNORMAL HIGH (ref 0.61–1.24)
GFR, EST AFRICAN AMERICAN: 58 mL/min — AB (ref >60)
GFR, EST NON AFRICAN AMERICAN: 50 mL/min — AB (ref >60)
Glucose, Bld: 273 mg/dL — ABNORMAL HIGH (ref 70–99)
POTASSIUM: 4.6 mmol/L (ref 3.5–5.1)
Sodium: 140 mmol/L (ref 135–145)

## 2018-02-22 LAB — CBC
HCT: 29.6 % — ABNORMAL LOW (ref 39.0–52.0)
HEMOGLOBIN: 9.8 g/dL — AB (ref 13.0–17.0)
MCH: 31.4 pg (ref 26.0–34.0)
MCHC: 33.1 g/dL (ref 30.0–36.0)
MCV: 94.9 fL (ref 78.0–100.0)
Platelets: 192 10*3/uL (ref 150–400)
RBC: 3.12 MIL/uL — AB (ref 4.22–5.81)
RDW: 12.6 % (ref 11.5–15.5)
WBC: 9.3 10*3/uL (ref 4.0–10.5)

## 2018-02-22 LAB — GLUCOSE, CAPILLARY
Glucose-Capillary: 255 mg/dL — ABNORMAL HIGH (ref 70–99)
Glucose-Capillary: 360 mg/dL — ABNORMAL HIGH (ref 70–99)
Glucose-Capillary: 191 mg/dL — ABNORMAL HIGH (ref 70–99)
Glucose-Capillary: 195 mg/dL — ABNORMAL HIGH (ref 70–99)

## 2018-02-22 MED ORDER — DONEPEZIL HCL 10 MG PO TABS
10.0000 mg | ORAL_TABLET | Freq: Every day | ORAL | Status: DC
  Administered 2018-02-22: 10 mg via ORAL
  Filled 2018-02-22: qty 1

## 2018-02-22 MED ORDER — SENNOSIDES-DOCUSATE SODIUM 8.6-50 MG PO TABS
1.0000 | ORAL_TABLET | Freq: Two times a day (BID) | ORAL | Status: DC
  Administered 2018-02-22 – 2018-02-23 (×3): 1 via ORAL
  Filled 2018-02-22 (×3): qty 1

## 2018-02-22 MED ORDER — MEGESTROL ACETATE 400 MG/10ML PO SUSP
400.0000 mg | Freq: Two times a day (BID) | ORAL | Status: DC
  Administered 2018-02-22 – 2018-02-23 (×2): 400 mg via ORAL
  Filled 2018-02-22 (×3): qty 10

## 2018-02-22 MED ORDER — VITAMIN B-1 100 MG PO TABS
100.0000 mg | ORAL_TABLET | Freq: Every day | ORAL | Status: DC
  Administered 2018-02-22 – 2018-02-23 (×2): 100 mg via ORAL
  Filled 2018-02-22 (×2): qty 1

## 2018-02-22 MED ORDER — ENSURE ENLIVE PO LIQD
237.0000 mL | Freq: Two times a day (BID) | ORAL | Status: DC
  Administered 2018-02-22 – 2018-02-23 (×3): 237 mL via ORAL

## 2018-02-22 MED ORDER — SIMVASTATIN 40 MG PO TABS
40.0000 mg | ORAL_TABLET | Freq: Every day | ORAL | Status: DC
  Administered 2018-02-22: 40 mg via ORAL
  Filled 2018-02-22: qty 1

## 2018-02-22 MED ORDER — POLYVINYL ALCOHOL 1.4 % OP SOLN
1.0000 [drp] | OPHTHALMIC | Status: DC | PRN
  Filled 2018-02-22: qty 15

## 2018-02-22 MED ORDER — THIAMINE HCL 100 MG PO TABS: 100.0000 mg | ORAL_TABLET | Freq: Every day | ORAL | Status: DC

## 2018-02-22 MED ORDER — POLYETHYLENE GLYCOL 3350 17 G PO PACK
17.0000 g | PACK | Freq: Every day | ORAL | Status: DC
  Administered 2018-02-22 – 2018-02-23 (×2): 17 g via ORAL
  Filled 2018-02-22 (×2): qty 1

## 2018-02-22 MED ORDER — SERTRALINE HCL 25 MG PO TABS
25.0000 mg | ORAL_TABLET | Freq: Every day | ORAL | Status: DC
  Administered 2018-02-22 – 2018-02-23 (×2): 25 mg via ORAL
  Filled 2018-02-22 (×2): qty 1

## 2018-02-22 MED ORDER — METFORMIN HCL 500 MG PO TABS
500.0000 mg | ORAL_TABLET | Freq: Two times a day (BID) | ORAL | Status: DC
  Administered 2018-02-22 – 2018-02-23 (×2): 500 mg via ORAL
  Filled 2018-02-22 (×2): qty 1

## 2018-02-22 NOTE — Progress Notes (Signed)
Progress Note    Richard Powers  ZOX:[id number] DOB: 15-Feb-1946  DOA: 02/20/2018 PCP: [name], MD    Brief Narrative:    Medical records reviewed and are as summarized below:  Richard Powers is an 72 y.o. male with history of dementia, diabetes mellitus type 2, anemia, chronic kidney disease, hyperlipidemia was brought to the ER the patient had an unwitnessed fall at the nursing facility.  Found to have a hip fracture-- had operative repair on 6/25.  Plan to return to SNF.    Assessment/Plan:   Principal Problem:   Closed left hip fracture, initial encounter [facility]) Active Problems:   Essential hypertension   DM (diabetes mellitus), type 2 with renal complications (HCC)   Dementia   CKD (chronic kidney disease) stage 2, GFR 60-89 ml/min   Closed left hip fracture (HCC)  Left hip fracture  -circumstances of patient's fall is unknown. -had surgery on 6/25  -PT eval -bed rest ordered d/c'd -PO pain control as patient removed IV-- no need to replace  Hypertension  -PRN IV hydralazine  Diabetes mellitus type 2 - -SSI -resume orals today  Dementia  -severe -from SNF -?palliative care to follow at SNF  Chronic kidney disease stage II appears to be at baseline.  Chronic anemia likely from renal disease  -CBC in AM-- transfuse as needed      Family Communication/Anticipated D/C date and plan/Code Status   DVT prophylaxis: scd Code Status: Full Code.  Family Communication: no family at bedside Disposition Plan: back to SNF   Medical Consultants:    ortho   Subjective:   Able to use incentive spirometry Pulled IV out this AM  Objective:    Vitals:   02/21/18 1706 02/21/18 2007 02/22/18 0011 02/22/18 0455  BP:  136/90 134/89 (!) 152/84  Pulse:  98 84 79  Resp:   16 15  Temp: 99.1 F (37.3 C) 98.6 F (37 C) 98.9 F (37.2 C) 98.6 F (37 C)  TempSrc:  Oral Oral Oral  SpO2:  100% 99% 96%  Weight:      Height:         Intake/Output Summary (Last 24 hours) at 02/22/2018 1143 Last data filed at 02/22/2018 0900 Gross per 24 hour  Intake 1300 ml  Output 525 ml  Net 775 ml   Filed Weights   02/21/18 0945  Weight: 65.9 kg (145 lb 4.5 oz)    Exam: In bed, alert and cooperative rrr +BS, soft Awake, pleasant and cooperative  Data Reviewed:   I have personally reviewed following labs and imaging studies:  Labs: Labs show the following:   Basic Metabolic Panel: Recent Labs  Lab 02/20/18 0229 02/21/18 0658 02/22/18 0410  NA 142 140 140  K 3.9 3.8 4.6  CL 104 106 105  CO2 23 25 25   GLUCOSE 300* 179* 273*  BUN 16 10 23   CREATININE 1.29* 1.08 1.37*  CALCIUM 9.2 8.7* 8.5*   GFR Estimated Creatinine Clearance: 46.1 mL/min (A) (by C-G formula based on SCr of 1.37 mg/dL (H)). Liver Function Tests: No results for input(s): AST, ALT, ALKPHOS, BILITOT, PROT, ALBUMIN in the last 168 hours. No results for input(s): LIPASE, AMYLASE in the last 168 hours. No results for input(s): AMMONIA in the last 168 hours. Coagulation profile Recent Labs  Lab 02/20/18 0229  INR 1.15    CBC: Recent Labs  Lab 02/20/18 0229 02/21/18 0658 02/22/18 0410  WBC 10.1 8.1 9.3  NEUTROABS 7.9*  --   --  HGB 10.5* 10.3* 9.8*  HCT 32.7* 31.6* 29.6*  MCV 96.2 97.2 94.9  PLT 204 195 192   Cardiac Enzymes: No results for input(s): CKTOTAL, CKMB, CKMBINDEX, TROPONINI in the last 168 hours. BNP (last 3 results) No results for input(s): PROBNP in the last 8760 hours. CBG: Recent Labs  Lab 02/21/18 1459 02/21/18 1742 02/21/18 2059 02/22/18 0629 02/22/18 1124  GLUCAP 149* 197* 345* 255* 360*   D-Dimer: No results for input(s): DDIMER in the last 72 hours. Hgb A1c: No results for input(s): HGBA1C in the last 72 hours. Lipid Profile: No results for input(s): CHOL, HDL, LDLCALC, TRIG, CHOLHDL, LDLDIRECT in the last 72 hours. Thyroid function studies: No results for input(s): TSH, T4TOTAL, T3FREE,  THYROIDAB in the last 72 hours.  Invalid input(s): FREET3 Anemia work up: No results for input(s): VITAMINB12, FOLATE, FERRITIN, TIBC, IRON, RETICCTPCT in the last 72 hours. Sepsis Labs: Recent Labs  Lab 02/20/18 0229 02/21/18 0658 02/22/18 0410  WBC 10.1 8.1 9.3    Microbiology Recent Results (from the past 240 hour(s))  Surgical pcr screen     Status: None   Collection Time: 02/20/18  8:10 AM  Result Value Ref Range Status   MRSA, PCR NEGATIVE NEGATIVE Final   Staphylococcus aureus NEGATIVE NEGATIVE Final    Comment: (NOTE) The Xpert SA Assay (FDA approved for NASAL specimens in patients 72 years of age and older), is one component of a comprehensive surveillance program. It is not intended to diagnose infection nor to guide or monitor treatment. Performed at [facility], [address] [place], [place] [address]     Procedures and diagnostic studies:  Dg Pelvis Portable  Result Date: 02/21/2018 CLINICAL DATA:  72 year old male with intertrochanteric fracture. Subsequent encounter. EXAM: PORTABLE PELVIS 1-2 VIEWS COMPARISON:  Intraoperative C-arm views 02/21/2018. Preoperative plain film examination 02/20/2018. FINDINGS: Open reduction and internal fixation of left intertrochanteric fracture with sliding dynamic type screw and intramedullary rod. Decrease separation of fracture fragments compared to preoperative exam. IMPRESSION: Open reduction and internal fixation left intertrochanteric fracture. Electronically Signed   By: Lacy DuverneySteven  Olson M.D.   On: 02/21/2018 15:43   Dg C-arm 1-60 Min  Result Date: 02/21/2018 CLINICAL DATA:  Left proximal femoral fracture EXAM: LEFT FEMUR 2 VIEWS; DG C-ARM 61-120 MIN COMPARISON:  None. FLUOROSCOPY TIME:  Fluoroscopy Time:  58 seconds Radiation Exposure Index (if provided by the fluoroscopic device): Not available Number of Acquired Spot Images: 4 FINDINGS: Proximal medullary rod is noted within the femur. Fixation screws noted  traversing the femoral neck. The fracture fragments are in near anatomic alignment. IMPRESSION: ORIF of left femoral fracture. Electronically Signed   By: Alcide CleverMark  Lukens M.D.   On: 02/21/2018 15:05   Dg Femur Min 2 Views Left  Result Date: 02/21/2018 CLINICAL DATA:  Left proximal femoral fracture EXAM: LEFT FEMUR 2 VIEWS; DG C-ARM 61-120 MIN COMPARISON:  None. FLUOROSCOPY TIME:  Fluoroscopy Time:  58 seconds Radiation Exposure Index (if provided by the fluoroscopic device): Not available Number of Acquired Spot Images: 4 FINDINGS: Proximal medullary rod is noted within the femur. Fixation screws noted traversing the femoral neck. The fracture fragments are in near anatomic alignment. IMPRESSION: ORIF of left femoral fracture. Electronically Signed   By: Alcide CleverMark  Lukens M.D.   On: 02/21/2018 15:05    Medications:   . aspirin EC  81 mg Oral BID  . docusate sodium  100 mg Oral BID  . donepezil  10 mg Oral QHS  . feeding  supplement (ENSURE ENLIVE)  237 mL Oral BID BM  . feeding supplement (GLUCERNA SHAKE)  237 mL Oral TID BM  . insulin aspart  0-9 Units Subcutaneous TID WC  . megestrol  400 mg Oral BID  . metFORMIN  500 mg Oral BID WC  . multivitamin with minerals  1 tablet Oral Daily  . sertraline  25 mg Oral Daily  . simvastatin  40 mg Oral Daily  . thiamine  100 mg Oral Daily   Continuous Infusions: . methocarbamol (ROBAXIN)  IV       LOS: 2 days   Joseph Art  [facility]   *Please refer to [email or web address], password [email or web address] to get updated schedule on who will round on this patient, as hospitalists switch teams weekly. If 7PM-7AM, please contact night-coverage at [email or web address], password [email or web address] for any overnight needs.  02/22/2018, 11:43 AM

## 2018-02-22 NOTE — Evaluation (Signed)
Physical Therapy Evaluation Patient Details Name: Richard Powers MRN: [id number] DOB: December 22, 1945 Today's Date: 02/22/2018   History of Present Illness  Pt is a 72 y/o male s/p IM nail L hip fx secondary to sustaining a fall at his SNF. PMH including but not limited to dementia, DM and HTN.    Clinical Impression  Pt presented supine in bed with HOB elevated, awake and willing to participate in therapy session. Pt with cognitive deficits at baseline and no family/caregivers present to provide any reliable history information. Pt currently requires total A x2 for bed mobility, attempted transfers with STEDY x2 but pt resistant to movement. Pt would continue to benefit from skilled physical therapy services at this time while admitted and after d/c to address the below listed limitations in order to improve overall safety and independence with functional mobility.     Follow Up Recommendations SNF    Equipment Recommendations  None recommended by PT    Recommendations for Other Services       Precautions / Restrictions Precautions Precautions: Fall Restrictions Weight Bearing Restrictions: Yes LLE Weight Bearing: Weight bearing as tolerated      Mobility  Bed Mobility Overal bed mobility: Needs Assistance Bed Mobility: Supine to Sit;Sit to Supine;Rolling Rolling: Total assist;+2 for physical assistance   Supine to sit: Total assist;+2 for physical assistance Sit to supine: Total assist;+2 for physical assistance   General bed mobility comments: pt resistant at times to mobility  Transfers Overall transfer level: Needs assistance               General transfer comment: attempted to have pt stand from EOB with STEDY x2 and max A x2; however, pt resisting and becoming agitated, yelling   Ambulation/Gait                Stairs            Wheelchair Mobility    Modified Rankin (Stroke Patients Only)       Balance Overall balance assessment:  Needs assistance Sitting-balance support: Bilateral upper extremity supported;No upper extremity supported Sitting balance-Leahy Scale: Poor         Standing balance comment: NT. unable to stand, attempted to use Stedy x 2                             Pertinent Vitals/Pain Pain Assessment: Faces Faces Pain Scale: Hurts whole lot Pain Location: L hip Pain Descriptors / Indicators: Moaning;Guarding;Grimacing Pain Intervention(s): Monitored during session;Repositioned    Home Living Family/patient expects to be discharged to:: Skilled nursing facility                      Prior Function Level of Independence: Needs assistance   Gait / Transfers Assistance Needed: pt reported that he ambulated with [facility] prior to admission     Comments: per pt he ambulated at SNF with a cane and was independent with ADLs (uncertain of accuracy of info from pt)     Hand Dominance   Dominant Hand: Right    Extremity/Trunk Assessment   Upper Extremity Assessment Upper Extremity Assessment: Defer to OT evaluation    Lower Extremity Assessment Lower Extremity Assessment: Generalized weakness       Communication   Communication: No difficulties  Cognition Arousal/Alertness: Awake/alert Behavior During Therapy: Agitated;Anxious Overall Cognitive Status: History of cognitive impairments - at baseline Area of Impairment: Orientation;Attention;Following commands;Safety/judgement;Memory;Awareness;Problem solving  Orientation Level: Disoriented to;Place;Time;Situation Current Attention Level: Focused Memory: Decreased recall of precautions;Decreased short-term memory Following Commands: Follows one step commands inconsistently Safety/Judgement: Decreased awareness of safety;Decreased awareness of deficits   Problem Solving: Slow processing;Decreased initiation;Difficulty sequencing;Requires verbal cues;Requires tactile cues General Comments: pt with hx  of dementia      General Comments      Exercises     Assessment/Plan    PT Assessment Patient needs continued PT services  PT Problem List Decreased strength;Decreased range of motion;Decreased activity tolerance;Decreased balance;Decreased coordination;Decreased mobility;Decreased cognition;Decreased knowledge of use of DME;Decreased safety awareness;Decreased knowledge of precautions;Pain       PT Treatment Interventions DME instruction;Gait training;Stair training;Functional mobility training;Therapeutic activities;Therapeutic exercise;Balance training;Neuromuscular re-education;Patient/family education    PT Goals (Current goals can be found in the Care Plan section)  Acute Rehab PT Goals Patient Stated Goal: none stated PT Goal Formulation: Patient unable to participate in goal setting Time For Goal Achievement: 03/08/18 Potential to Achieve Goals: Fair    Frequency Min 2X/week   Barriers to discharge        Co-evaluation PT/OT/SLP Co-Evaluation/Treatment: Yes Reason for Co-Treatment: To address functional/ADL transfers;For patient/therapist safety PT goals addressed during session: Mobility/safety with mobility;Balance;Proper use of DME;Strengthening/ROM         AM-PAC PT "6 Clicks" Daily Activity  Outcome Measure Difficulty turning over in bed (including adjusting bedclothes, sheets and blankets)?: Unable Difficulty moving from lying on back to sitting on the side of the bed? : Unable Difficulty sitting down on and standing up from a chair with arms (e.g., wheelchair, bedside commode, etc,.)?: Unable Help needed moving to and from a bed to chair (including a wheelchair)?: Total Help needed walking in hospital room?: Total Help needed climbing 3-5 steps with a railing? : Total 6 Click Score: 6    End of Session   Activity Tolerance: Patient limited by pain;Treatment limited secondary to agitation Patient left: in bed;with call bell/phone within reach;with bed  alarm set Nurse Communication: Mobility status;Need for lift equipment PT Visit Diagnosis: Other abnormalities of gait and mobility (R26.89);Pain Pain - Right/Left: Left Pain - part of body: Hip    Time: [id number] PT Time Calculation (min) (ACUTE ONLY): 25 min   Charges:   PT Evaluation $PT Eval Moderate Complexity: 1 Mod     PT G Codes:        CasseltonJennifer Chelsei Mcchesney, PT, DPT [phone]   Alessandra BevelsJennifer M Stoney Karczewski 02/22/2018, 4:25 PM

## 2018-02-22 NOTE — Progress Notes (Signed)
PT Cancellation Note  Patient Details Name: Richard Powers MRN: [id number] DOB: December 14, 1945   Cancelled Treatment:    Reason Eval/Treat Not Completed: Active bedrest order. PT will continue to follow and await updated activity orders.   Alessandra BevelsJennifer M Kaylynn Chamblin 02/22/2018, 8:11 AM

## 2018-02-22 NOTE — [place] FL2 (Signed)
[place] MEDICAID FL2 LEVEL OF CARE SCREENING TOOL     IDENTIFICATION  Patient Name: Richard Powers Birthdate: [birth year] Sex: male Admission Date (Current Location): 02/20/2018  [facility] [place]   Facility and Address:  [facility], [address], [place], [place] [address]      Provider Number: [id number]  Attending Physician Name and Address:  Joseph ArtVann, Jessica U, DO  Relative Name and Phone Number:  Ronne BinningDenise Matier, neice, [phone]    Current Level of Care: Hospital Recommended Level of Care: Skilled Nursing Facility Prior Approval Number:    Date Approved/Denied:   PASRR Number: [id number]  Discharge Plan: SNF    Current Diagnoses: Patient Active Problem List   Diagnosis Date Noted  . Closed left hip fracture, initial encounter (HCC) 02/20/2018  . CKD (chronic kidney disease) stage 2, GFR 60-89 ml/min 02/20/2018  . Closed left hip fracture (HCC) 02/20/2018  . Dehydration 11/16/2017  . AKI (acute kidney injury) (HCC) 11/16/2017  . Acute encephalopathy 11/16/2017  . Elevated lactic acid level 11/16/2017  . Glaucoma 10/17/2017  . Dementia 09/15/2017  . Sepsis (HCC) 09/09/2017  . Essential hypertension 09/09/2017  . DM (diabetes mellitus), type 2 with renal complications (HCC) 09/09/2017    Orientation RESPIRATION BLADDER Height & Weight     (Disoriented)  Normal Incontinent Weight: 145 lb 4.5 oz (65.9 kg) Height:  5\' 7"  (170.2 cm)  BEHAVIORAL SYMPTOMS/MOOD NEUROLOGICAL BOWEL NUTRITION STATUS      Continent Diet(See DC Summary)  AMBULATORY STATUS COMMUNICATION OF NEEDS Skin   Extensive Assist Verbally Surgical wounds                       Personal Care Assistance Level of Assistance  Bathing, Feeding, Dressing Bathing Assistance: Maximum assistance Feeding assistance: Maximum assistance Dressing Assistance: Maximum assistance     Functional Limitations Info  Speech, Hearing, Sight Sight  Info: Adequate Hearing Info: Adequate Speech Info: Adequate    SPECIAL CARE FACTORS FREQUENCY  PT (By licensed PT), OT (By licensed OT)     PT Frequency: 2x week OT Frequency: 2x week            Contractures Contractures Info: Not present    Additional Factors Info  Code Status, Allergies, Psychotropic Code Status Info: Full Code Allergies Info: No Known Allergies Psychotropic Info: Zoloft         Current Medications (02/22/2018):  This is the current hospital active medication list Current Facility-Administered Medications  Medication Dose Route Frequency Provider Last Rate Last Dose  . acetaminophen (TYLENOL) tablet 325-650 mg  325-650 mg Oral Q6H PRN Yolonda Kidaogers, Jason Patrick, MD      . aspirin EC tablet 81 mg  81 mg Oral BID Yolonda Kidaogers, Jason Patrick, MD   81 mg at 02/22/18 0953  . docusate sodium (COLACE) capsule 100 mg  100 mg Oral BID Yolonda Kidaogers, Jason Patrick, MD   100 mg at 02/22/18 0954  . donepezil (ARICEPT) tablet 10 mg  10 mg Oral QHS Vann, Jessica U, DO      . feeding supplement (ENSURE ENLIVE) (ENSURE ENLIVE) liquid 237 mL  237 mL Oral BID BM Vann, Jessica U, DO   237 mL at 02/22/18 1218  . feeding supplement (GLUCERNA SHAKE) (GLUCERNA SHAKE) liquid 237 mL  237 mL Oral TID BM Yolonda Kidaogers, Jason Patrick, MD   237 mL at 02/22/18 0954  . HYDROcodone-acetaminophen (NORCO) 7.5-325 MG per tablet 1-2 tablet  1-2 tablet Oral  Q4H PRN Yolonda Kida, MD      . HYDROcodone-acetaminophen (NORCO/VICODIN) 5-325 MG per tablet 1-2 tablet  1-2 tablet Oral Q4H PRN Yolonda Kida, MD   2 tablet at 02/22/18 1114  . insulin aspart (novoLOG) injection 0-9 Units  0-9 Units Subcutaneous TID WC Yolonda Kida, MD   9 Units at 02/22/18 1219  . megestrol (MEGACE) 400 MG/10ML suspension 400 mg  400 mg Oral BID Vann, Jessica U, DO      . metFORMIN (GLUCOPHAGE) tablet 500 mg  500 mg Oral BID WC Vann, Jessica U, DO      . methocarbamol (ROBAXIN) tablet 500 mg  500 mg Oral Q6H PRN Yolonda Kida, MD   500 mg at 02/22/18 1114   Or  . methocarbamol (ROBAXIN) 500 mg in dextrose 5 % 50 mL IVPB  500 mg Intravenous Q6H PRN Yolonda Kida, MD      . metoCLOPramide [facility]) tablet 5-10 mg  5-10 mg Oral Q8H PRN Yolonda Kida, MD       Or  . metoCLOPramide [facility]) injection 5-10 mg  5-10 mg Intravenous Q8H PRN Yolonda Kida, MD      . multivitamin with minerals tablet 1 tablet  1 tablet Oral Daily Yolonda Kida, MD   1 tablet at 02/22/18 [phone]  . ondansetron (ZOFRAN) tablet 4 mg  4 mg Oral Q6H PRN Yolonda Kida, MD       Or  . ondansetron [facility]) injection 4 mg  4 mg Intravenous Q6H PRN Yolonda Kida, MD      . polyethylene glycol (MIRALAX / GLYCOLAX) packet 17 g  17 g Oral Daily Marlin Canary U, DO   17 g at 02/22/18 1224  . polyvinyl alcohol (LIQUIFILM TEARS) 1.4 % ophthalmic solution 1 drop  1 drop Both Eyes PRN Vann, Jessica U, DO      . senna-docusate (Senokot-S) tablet 1 tablet  1 tablet Oral BID Marlin Canary U, DO   1 tablet at 02/22/18 1225  . sertraline (ZOLOFT) tablet 25 mg  25 mg Oral Daily Marlin Canary U, DO   25 mg at 02/22/18 1225  . simvastatin (ZOCOR) tablet 40 mg  40 mg Oral QHS Vann, Jessica U, DO      . thiamine (VITAMIN B-1) tablet 100 mg  100 mg Oral Daily Vann, Jessica U, DO   100 mg at 02/22/18 1225     Discharge Medications: Please see discharge summary for a list of discharge medications.  Relevant Imaging Results:  Relevant Lab Results:   Additional Information SS#: [phone] [address]  Tresa Moore, LCSW

## 2018-02-22 NOTE — Social Work (Signed)
CSW f/u for disposition.  [facility] and SNF will need to obtain Insurance Auth for placement.  CSW awaiting PT/OT evaluation.  CSW will continue to follow.  Keene BreathPatricia Donnika Kucher, LCSW Clinical Social Worker [phone]

## 2018-02-22 NOTE — Clinical Social Work Note (Signed)
Clinical Social Work Assessment  Patient Details  Name: Richard Powers MRN: [id number] Date of Birth: March 22, 1946  Date of referral:  02/22/18               Reason for consult:  Facility Placement                Permission sought to share information with:  Facility Industrial/product designerContact Representative Permission granted to share information::  Yes, Verbal Permission Granted  Name::        Agency::  SNF  Relationship::     Contact Information:     Housing/Transportation Living arrangements for the past 2 months:  Skilled Nursing Facility Source of Information:  Facility Patient Interpreter Needed:  None Criminal Activity/Legal Involvement Pertinent to Current Situation/Hospitalization:  No - Comment as needed Significant Relationships:  Siblings, Other Family Members Lives with:  Facility Resident Do you feel safe going back to the place where you live?  Yes Need for family participation in patient care:  No (Coment)  Care giving concerns:  [facility] and will return at discharge. Pt disoriented.  Social Worker assessment / plan:  CS[facility] CSW will completed SNF process and placement. SNF will need to obtain Insurance Auth.   Employment status:  Retired Database administratornsurance information:  Managed Medicare PT Recommendations:  Skilled Nursing Facility Information / Referral to community resources:  Skilled Nursing Facility  Patient/Family's Response to care:  SNF appreciative that CSW f/u on disposition and will completed FL2 at appropriate time.. CSW will f/u with family.  Patient/Family's Understanding of and Emotional Response to Diagnosis, Current Treatment, and Prognosis:  SNF confirmed that patient resides at facility and can return. Pt impairment will require rehabilitative therapy. Pt is disoriented. CSW will assist with disposition.  Emotional Assessment Appearance:  Appears stated age Attitude/Demeanor/Rapport:     Affect (typically observed):  Accepting, Appropriate Orientation:  (Disoriented) Alcohol / Substance use:  Not Applicable Psych involvement (Current and /or in the community):  No (Comment)  Discharge Needs  Concerns to be addressed:  Discharge Planning Concerns Readmission within the last 30 days:  No Current discharge risk:  Dependent with Mobility, Physical Impairment Barriers to Discharge:  No Barriers Identified   Tresa Mooreatricia V Quentina Fronek, LCSW 02/22/2018, 4:01 PM

## 2018-02-22 NOTE — Evaluation (Signed)
Occupational Therapy Evaluation Patient Details Name: Richard Powers MRN: [id number] DOB: [birth year] Today's Date: 02/22/2018    History of Present Illness INTRAMEDULLARY (IM) NAIL INTERTROCHANTRIC (Left) from fall at SNF, hx of dementia   Clinical Impression   Pt with decline in function and safety with ADLs and ADL mobility with decreased strength, balance, endurance and hx of cognitive impairments. Pt is a poor historian and uncertain of PLOF at this time. Pt requires total A +2 for mmobility and total A with ADLs. Pt unable to stand even with attempting x 2 to use Stedy. Pt became agitated and began moaning, yelling and cursing at therapists. Pt returned to supine in bed. Pt would benefit from acute OT services to address impairments to maximize level of function and safety    Follow Up Recommendations  SNF;Supervision/Assistance - 24 hour    Equipment Recommendations  Other (comment)(TBD at SNF)    Recommendations for Other Services       Precautions / Restrictions Precautions Precautions: Fall Restrictions Weight Bearing Restrictions: No LLE Weight Bearing: Weight bearing as tolerated      Mobility Bed Mobility Overal bed mobility: Needs Assistance Bed Mobility: Supine to Sit;Sit to Supine;Rolling Rolling: Total assist   Supine to sit: Total assist;+2 for physical assistance Sit to supine: Total assist;+2 for physical assistance      Transfers Overall transfer level: Needs assistance               General transfer comment: attempted to use Stedy x 2 for standing and pt resistive and non compliant; becomes agitated and yelling at therapisst    Balance Overall balance assessment: Needs assistance Sitting-balance support: Bilateral upper extremity supported;No upper extremity supported Sitting balance-Leahy Scale: Fair         Standing balance comment: NT. unable to stand, attempted to use Stedy x 2                           ADL  either performed or assessed with clinical judgement   ADL                                         General ADL Comments: total A with all ADLs     Vision Patient Visual Report: (unable to properly assess at this time)       Perception     Praxis      Pertinent Vitals/Pain Pain Assessment: Faces Faces Pain Scale: Hurts whole lot Pain Location: with bed mobility and attmepts to stand  Pain Descriptors / Indicators: Moaning(yelling, whining) Pain Intervention(s): Limited activity within patient's tolerance;Monitored during session;Premedicated before session;Repositioned     Hand Dominance Right   Extremity/Trunk Assessment Upper Extremity Assessment Upper Extremity Assessment: Generalized weakness   Lower Extremity Assessment Lower Extremity Assessment: Defer to PT evaluation       Communication Communication Communication: Other (comment)(mumbles. moans, whines)   Cognition Arousal/Alertness: Awake/alert Behavior During Therapy: Agitated;Anxious Overall Cognitive Status: No family/caregiver present to determine baseline cognitive functioning Area of Impairment: Orientation;Attention;Memory;Following commands;Safety/judgement;Awareness;Problem solving                 Orientation Level: Disoriented to;Place;Time;Situation     Following Commands: Follows one step commands inconsistently Safety/Judgement: Decreased awareness of safety;Decreased awareness of deficits   Problem Solving: Slow processing;Decreased initiation;Difficulty sequencing;Requires verbal cues;Requires tactile cues General Comments: pt with hx  of dementia   General Comments       Exercises     Shoulder Instructions      Home Living Family/patient expects to be discharged to:: Skilled nursing facility                                        Prior Functioning/Environment          Comments: per pt he ambulated at SNF with a cane and was independent  with ADLs (uncertain of accuracy of info from pt)        OT Problem List: Decreased strength;Decreased activity tolerance;Decreased cognition;Decreased knowledge of use of DME or AE;Impaired balance (sitting and/or standing);Decreased coordination;Decreased safety awareness;Decreased knowledge of precautions;Pain      OT Treatment/Interventions: Self-care/ADL training;Therapeutic exercise;DME and/or AE instruction;Neuromuscular education;Therapeutic activities;Patient/family education    OT Goals(Current goals can be found in the care plan section) Acute Rehab OT Goals Patient Stated Goal: none stated OT Goal Formulation: Patient unable to participate in goal setting Time For Goal Achievement: 03/08/18 Potential to Achieve Goals: Good ADL Goals Pt Will Perform Grooming: with max assist;with mod assist;sitting Pt Will Perform Upper Body Bathing: with max assist;with mod assist;sitting Pt Will Perform Upper Body Dressing: with max assist;with mod assist;sitting Pt Will Transfer to Toilet: with max assist;with +2 assist;squat pivot transfer;stand pivot transfer Additional ADL Goal #1: pt will complete be dmobility with max - mod A to sit EOB in prep for ADLs and transfers  OT Frequency: Min 2X/week   Barriers to D/C: Decreased caregiver support          Co-evaluation              AM-PAC PT "6 Clicks" Daily Activity     Outcome Measure Help from another person eating meals?: A Little Help from another person taking care of personal grooming?: Total Help from another person toileting, which includes using toliet, bedpan, or urinal?: Total Help from another person bathing (including washing, rinsing, drying)?: Total Help from another person to put on and taking off regular upper body clothing?: Total Help from another person to put on and taking off regular lower body clothing?: Total 6 Click Score: 8   End of Session Equipment Utilized During Treatment: Gait belt;Other  (comment)(Stedy)  Activity Tolerance: Patient limited by pain;Other (comment)(agitated) Patient left: in bed;with call bell/phone within reach;with bed alarm set  OT Visit Diagnosis: Unsteadiness on feet (R26.81);Other abnormalities of gait and mobility (R26.89);Muscle weakness (generalized) (M62.81);Cognitive communication deficit (R41.841);Other symptoms and signs involving cognitive function;Pain Pain - Right/Left: Left Pain - part of body: Hip;Leg                Time: 4098-1191 OT Time Calculation (min): 23 min Charges:  OT General Charges $OT Visit: 1 Visit OT Evaluation $OT Eval Moderate Complexity: 1 Mod G-Codes: OT G-codes **NOT FOR INPATIENT CLASS** Functional Assessment Tool Used: AM-PAC 6 Clicks Daily Activity     Galen Manila 02/22/2018, 2:37 PM

## 2018-02-22 NOTE — Progress Notes (Signed)
Inpatient Diabetes Program Recommendations  AACE/ADA: New Consensus Statement on Inpatient Glycemic Control (2015)  Target Ranges:  Prepandial:   less than 140 mg/dL      Peak postprandial:   less than 180 mg/dL (1-2 hours)      Critically ill patients:  140 - 180 mg/dL   Lab Results  Component Value Date   GLUCAP 255 (H) 02/22/2018   HGBA1C 9.6 (H) 11/17/2017   Review of Glycemic Control  Diabetes history: DM 2 Outpatient Diabetes medications: Novolin R 2-8 units tid starting at 201, Metformin 500 mg BID Current orders for Inpatient glycemic control: Novolog 0-9 units tid  Inpatient Diabetes Program Recommendations:    Glucose trend increasing as diet is started. Patient ate 100% for breakfast. Hopefully orals can be restarted from home OR Correction scale to be increased to moderate 0-15 units tid.  Thanks,  Christena DeemShannon Jasey Cortez RN, MSN, BC-ADM, [facility] Inpatient Diabetes Coordinator Team Pager [phone] (8a-5p)

## 2018-02-22 NOTE — Plan of Care (Signed)
  Problem: Education: Goal: Knowledge of General Education information will improve Outcome: Progressing Note:  POC reviewed with pt.; pt. seem to understand some.   

## 2018-02-23 DIAGNOSIS — E1121 Type 2 diabetes mellitus with diabetic nephropathy: Secondary | ICD-10-CM

## 2018-02-23 DIAGNOSIS — S72002A Fracture of unspecified part of neck of left femur, initial encounter for closed fracture: Secondary | ICD-10-CM

## 2018-02-23 DIAGNOSIS — F0150 Vascular dementia without behavioral disturbance: Secondary | ICD-10-CM

## 2018-02-23 LAB — CBC
HCT: 27.3 % — ABNORMAL LOW (ref 39.0–52.0)
HEMOGLOBIN: 8.8 g/dL — AB (ref 13.0–17.0)
MCH: 30.9 pg (ref 26.0–34.0)
MCHC: 32.2 g/dL (ref 30.0–36.0)
MCV: 95.8 fL (ref 78.0–100.0)
Platelets: 215 10*3/uL (ref 150–400)
RBC: 2.85 MIL/uL — AB (ref 4.22–5.81)
RDW: 12.8 % (ref 11.5–15.5)
WBC: 8.4 10*3/uL (ref 4.0–10.5)

## 2018-02-23 LAB — BASIC METABOLIC PANEL
Anion gap: 7 (ref 5–15)
BUN: 23 mg/dL (ref 8–23)
CHLORIDE: 105 mmol/L (ref 98–111)
CO2: 28 mmol/L (ref 22–32)
Calcium: 8.1 mg/dL — ABNORMAL LOW (ref 8.9–10.3)
Creatinine, Ser: 1.18 mg/dL (ref 0.61–1.24)
GFR calc non Af Amer: >60 mL/min (ref >60)
Glucose, Bld: 222 mg/dL — ABNORMAL HIGH (ref 70–99)
POTASSIUM: 4 mmol/L (ref 3.5–5.1)
SODIUM: 140 mmol/L (ref 135–145)

## 2018-02-23 LAB — GLUCOSE, CAPILLARY
GLUCOSE-CAPILLARY: 205 mg/dL — AB (ref 70–99)
GLUCOSE-CAPILLARY: 220 mg/dL — AB (ref 70–99)

## 2018-02-23 MED ORDER — AMLODIPINE BESYLATE 10 MG PO TABS
10.0000 mg | ORAL_TABLET | Freq: Every day | ORAL | Status: DC
  Administered 2018-02-23: 10 mg via ORAL
  Filled 2018-02-23: qty 1

## 2018-02-23 MED ORDER — HYDROCODONE-ACETAMINOPHEN 7.5-325 MG PO TABS: 1.0000 | ORAL_TABLET | ORAL | 0 refills | Status: DC | PRN

## 2018-02-23 MED ORDER — POLYETHYLENE GLYCOL 3350 17 G PO PACK: 17.0000 g | PACK | Freq: Every day | ORAL | 0 refills | Status: DC

## 2018-02-23 MED ORDER — CLONIDINE HCL 0.1 MG PO TABS
0.1000 mg | ORAL_TABLET | Freq: Two times a day (BID) | ORAL | Status: DC
  Administered 2018-02-23: 0.1 mg via ORAL
  Filled 2018-02-23: qty 1

## 2018-02-23 MED ORDER — ASPIRIN 81 MG PO TBEC: 81.0000 mg | DELAYED_RELEASE_TABLET | Freq: Two times a day (BID) | ORAL | 1 refills | Status: DC

## 2018-02-23 MED ORDER — AMLODIPINE BESYLATE 10 MG PO TABS: 10.0000 mg | ORAL_TABLET | Freq: Every day | ORAL | 1 refills | Status: DC

## 2018-02-23 MED ORDER — DOCUSATE SODIUM 100 MG PO CAPS: 100.0000 mg | ORAL_CAPSULE | Freq: Two times a day (BID) | ORAL | 0 refills | Status: DC

## 2018-02-23 MED ORDER — SENNOSIDES-DOCUSATE SODIUM 8.6-50 MG PO TABS: 1.0000 | ORAL_TABLET | Freq: Two times a day (BID) | ORAL | 0 refills | Status: DC

## 2018-02-23 NOTE — Social Work (Signed)
Clinical Social Worker facilitated patient discharge including contacting patient family and facility to confirm patient discharge plans.  Clinical information faxed to facility and family agreeable with plan.    CSW arranged ambulance transport via [facility] to [place] .    RN to call [phone] to give report prior to discharge.  Clinical Social Worker will sign off for now as social work intervention is no longer needed. Please consult us again if new need arises.  Shantanique Hodo, LCSW Clinical Social Worker [phone]    

## 2018-02-23 NOTE — Discharge Summary (Signed)
Physician Discharge Summary  Gagandeep Pettet Powers MRN: [id number] DOB/AGE: 1946-02-10 72 y.o.  PCP: Charlott Rakes, MD   Admit date: 02/20/2018 Discharge date: 02/23/2018  Discharge Diagnoses:    Principal Problem:   Closed left hip fracture, initial encounter [facility]) Active Problems:   Essential hypertension   DM (diabetes mellitus), type 2 with renal complications (HCC)   Dementia   CKD (chronic kidney disease) stage 2, GFR 60-89 ml/min   Closed left hip fracture (HCC)    Follow-up recommendations Follow-up with PCP in 3-5 days , including all  additional recommended appointments as below Follow-up CBC, CMP in 3-5 days  Continue aspirin for DVT prophylaxis for 4 weeks     Allergies as of 02/23/2018   No Known Allergies     Medication List    TAKE these medications   acetaminophen 500 MG tablet Commonly known as:  TYLENOL Take 1 tablet (500 mg total) by mouth every 6 (six) hours as needed. What changed:  reasons to take this   amLODipine 10 MG tablet Commonly known as:  NORVASC Take 1 tablet (10 mg total) by mouth daily. Start taking on:  02/24/2018   aspirin 81 MG EC tablet Take 1 tablet (81 mg total) by mouth 2 (two) times daily.   docusate sodium 100 MG capsule Commonly known as:  COLACE Take 1 capsule (100 mg total) by mouth 2 (two) times daily.   donepezil 10 MG tablet Commonly known as:  ARICEPT Take 1 tablet (10 mg total) by mouth at bedtime.   NUTRITIONAL DRINK PO Take 1 Container by mouth 2 (two) times daily. Mighty Shake   feeding supplement (ENSURE ENLIVE) Liqd Take 237 mLs by mouth 2 (two) times daily between meals.   hydrocerin Crea Apply 1 application topically 2 (two) times daily.   HYDROcodone-acetaminophen 7.5-325 MG tablet Commonly known as:  NORCO Take 1-2 tablets by mouth every 4 (four) hours as needed for severe pain (pain score 7-10).   insulin regular 100 units/mL injection Commonly known as:  NOVOLIN R,HUMULIN R Inject  2-8 Units into the skin See admin instructions. Per sliding scale: 201-250 give 2 units 251-300 give 4 units 301-350 give 6 units 351-400 give 8 units   megestrol 400 MG/10ML suspension Commonly known as:  MEGACE Take 10 mLs (400 mg total) by mouth 2 (two) times daily.   metFORMIN 500 MG tablet Commonly known as:  GLUCOPHAGE Take 500 mg by mouth 2 (two) times daily with a meal.   polyethylene glycol packet Commonly known as:  MIRALAX / GLYCOLAX Take 17 g by mouth daily. Start taking on:  02/24/2018   polyvinyl alcohol 1.4 % ophthalmic solution Commonly known as:  LIQUIFILM TEARS Place 1 drop into both eyes as needed for dry eyes.   senna-docusate 8.6-50 MG tablet Commonly known as:  Senokot-S Take 1 tablet by mouth 2 (two) times daily.   sertraline 25 MG tablet Commonly known as:  ZOLOFT Take 25 mg by mouth daily.   simvastatin 40 MG tablet Commonly known as:  ZOCOR Take 1 tablet (40 mg total) by mouth daily.   thiamine 100 MG tablet Take 1 tablet (100 mg total) by mouth daily.        Discharge Condition:    Discharge Instructions Get Medicines reviewed and adjusted: Please take all your medications with you for your next visit with your Primary MD  Please request your Primary MD to go over all hospital tests and procedure/radiological results at the follow up, please  ask your Primary MD to get all Hospital records sent to his/her office.  If you experience worsening of your admission symptoms, develop shortness of breath, life threatening emergency, suicidal or homicidal thoughts you must seek medical attention immediately by calling 911 or calling your MD immediately  if symptoms less severe.  You must read complete instructions/literature along with all the possible adverse reactions/side effects for all the Medicines you take and that have been prescribed to you. Take any new Medicines after you have completely understood and accpet all the possible adverse  reactions/side effects.   Do not drive when taking Pain medications.   Do not take more than prescribed Pain, Sleep and Anxiety Medications  Special Instructions: If you have smoked or chewed Tobacco  in the last 2 yrs please stop smoking, stop any regular Alcohol  and or any Recreational drug use.  Wear Seat belts while driving.  Please note  You were cared for by a hospitalist during your hospital stay. Once you are discharged, your primary care physician will handle any further medical issues. Please note that NO REFILLS for any discharge medications will be authorized once you are discharged, as it is imperative that you return to your primary care physician (or establish a relationship with a primary care physician if you do not have one) for your aftercare needs so that they can reassess your need for medications and monitor your lab values.     No Known Allergies    Disposition: Discharge disposition: 03-Skilled Nursing Facility        Consults:  Orthopedics    Significant Diagnostic Studies:  Dg Chest 1 View  Result Date: 02/20/2018 CLINICAL DATA:  Preop left hip fracture. EXAM: CHEST  1 VIEW COMPARISON:  11/16/2017 FINDINGS: Top-normal size heart. Aortic atherosclerosis is noted. No pulmonary consolidation, effusion or pneumothorax. Skin fold artifacts suspected along the periphery the right lung base. No acute osseous abnormality. IMPRESSION: No active disease. Electronically Signed   By: Ashley Royalty M.D.   On: 02/20/2018 02:23   Ct Head Wo Contrast  Result Date: 02/20/2018 CLINICAL DATA:  Altered level of consciousness.  Fall. EXAM: CT HEAD WITHOUT CONTRAST TECHNIQUE: Contiguous axial images were obtained from the base of the skull through the vertex without intravenous contrast. COMPARISON:  CT head without contrast 09/09/2017 and 09/03/2017. FINDINGS: Brain: Atrophy and white matter changes are stable. No acute cortical infarct, hemorrhage, or mass lesion is  present. No new infarcts are present. Basal ganglia are intact. Insular ribbon is normal bilaterally. Septum pellucidum is again noted. Ventricles proportionate to the degree of atrophy. The brainstem and cerebellum are normal. Vascular: Atherosclerotic calcifications are present at the cavernous internal carotid arteries. There is no hyperdense vessel. Skull: Calvarium is intact. No focal lytic or blastic lesions are present. Sinuses/Orbits: The paranasal sinuses and mastoid air cells are clear. Globes and orbits are within normal limits. IMPRESSION: Negative CT of the head. Electronically Signed   By: San Morelle M.D.   On: 02/20/2018 07:13   Dg Pelvis Portable  Result Date: 02/21/2018 CLINICAL DATA:  72 year old male with intertrochanteric fracture. Subsequent encounter. EXAM: PORTABLE PELVIS 1-2 VIEWS COMPARISON:  Intraoperative C-arm views 02/21/2018. Preoperative plain film examination 02/20/2018. FINDINGS: Open reduction and internal fixation of left intertrochanteric fracture with sliding dynamic type screw and intramedullary rod. Decrease separation of fracture fragments compared to preoperative exam. IMPRESSION: Open reduction and internal fixation left intertrochanteric fracture. Electronically Signed   By: Genia Del M.D.   On: 02/21/2018  15:43   Chest Portable 1 View  Result Date: 02/20/2018 CLINICAL DATA:  72 year old male undergoing preoperative evaluation prior to left hip surgery. EXAM: PORTABLE CHEST 1 VIEW COMPARISON:  Prior chest x-ray earlier today, 02/20/2018 FINDINGS: The lungs are clear and negative for focal airspace consolidation, pulmonary edema or suspicious pulmonary nodule. Improved inspiratory volumes a decreased bibasilar subsegmental atelectasis. No pleural effusion or pneumothorax. Cardiac and mediastinal contours are within normal limits. No acute fracture or lytic or blastic osseous lesions. The visualized upper abdominal bowel gas pattern is unremarkable.  Multiple remote healed left-sided rib fractures. IMPRESSION: No active disease. Electronically Signed   By: Jacqulynn Cadet M.D.   On: 02/20/2018 08:11   Dg C-arm 1-60 Min  Result Date: 02/21/2018 CLINICAL DATA:  Left proximal femoral fracture EXAM: LEFT FEMUR 2 VIEWS; DG C-ARM 61-120 MIN COMPARISON:  None. FLUOROSCOPY TIME:  Fluoroscopy Time:  58 seconds Radiation Exposure Index (if provided by the fluoroscopic device): Not available Number of Acquired Spot Images: 4 FINDINGS: Proximal medullary rod is noted within the femur. Fixation screws noted traversing the femoral neck. The fracture fragments are in near anatomic alignment. IMPRESSION: ORIF of left femoral fracture. Electronically Signed   By: Inez Catalina M.D.   On: 02/21/2018 15:05   Dg Hip Unilat W Or Wo Pelvis 2-3 Views Left  Result Date: 02/20/2018 CLINICAL DATA:  Left hip pain after fall EXAM: DG HIP (WITH OR WITHOUT PELVIS) 2-3V LEFT COMPARISON:  None. FINDINGS: Acute, closed, slightly distracted left intertrochanteric fracture of the femur is identified. Intact bony pelvis. No hip joint dislocation. IMPRESSION: Acute intertrochanteric fracture of the left femur. Electronically Signed   By: Ashley Royalty M.D.   On: 02/20/2018 02:52   Dg Femur Min 2 Views Left  Result Date: 02/21/2018 CLINICAL DATA:  Left proximal femoral fracture EXAM: LEFT FEMUR 2 VIEWS; DG C-ARM 61-120 MIN COMPARISON:  None. FLUOROSCOPY TIME:  Fluoroscopy Time:  58 seconds Radiation Exposure Index (if provided by the fluoroscopic device): Not available Number of Acquired Spot Images: 4 FINDINGS: Proximal medullary rod is noted within the femur. Fixation screws noted traversing the femoral neck. The fracture fragments are in near anatomic alignment. IMPRESSION: ORIF of left femoral fracture. Electronically Signed   By: Inez Catalina M.D.   On: 02/21/2018 15:05      Filed Weights   02/21/18 0945  Weight: 65.9 kg (145 lb 4.5 oz)     Microbiology: Recent Results  (from the past 240 hour(s))  Surgical pcr screen     Status: None   Collection Time: 02/20/18  8:10 AM  Result Value Ref Range Status   MRSA, PCR NEGATIVE NEGATIVE Final   Staphylococcus aureus NEGATIVE NEGATIVE Final    Comment: (NOTE) The Xpert SA Assay (FDA approved for NASAL specimens in patients 56 years of age and older), is one component of a comprehensive surveillance program. It is not intended to diagnose infection nor to guide or monitor treatment. Performed at [facility], [place] [address] [place], [place] [address]        Blood Culture    Component Value Date/Time   SDES BLOOD RIGHT HAND 11/16/2017 2200   SPECREQUEST IN PEDIATRIC BOTTLE Blood Culture adequate volume 11/16/2017 2200   CULT  11/16/2017 2200    NO GROWTH 5 DAYS Performed at [facility], [place] [address] [place], [place] [address]    REPTSTATUS 11/21/2017 FINAL 11/16/2017 2200      Labs: Results for orders placed or  performed during the hospital encounter of 02/20/18 (from the past 48 hour(s))  Glucose, capillary     Status: Abnormal   Collection Time: 02/21/18 10:23 AM  Result Value Ref Range   Glucose-Capillary 162 (H) 70 - 99 mg/dL  Glucose, capillary     Status: Abnormal   Collection Time: 02/21/18 12:39 PM  Result Value Ref Range   Glucose-Capillary 150 (H) 70 - 99 mg/dL   Comment 1 Notify RN    Comment 2 Document in Chart   Glucose, capillary     Status: Abnormal   Collection Time: 02/21/18  2:59 PM  Result Value Ref Range   Glucose-Capillary 149 (H) 70 - 99 mg/dL  Glucose, capillary     Status: Abnormal   Collection Time: 02/21/18  5:42 PM  Result Value Ref Range   Glucose-Capillary 197 (H) 70 - 99 mg/dL  Glucose, capillary     Status: Abnormal   Collection Time: 02/21/18  8:59 PM  Result Value Ref Range   Glucose-Capillary 345 (H) 70 - 99 mg/dL  CBC     Status: Abnormal   Collection Time: 02/22/18  4:10 AM  Result Value Ref Range   WBC 9.3 4.0 - 10.5 K/uL   RBC  3.12 (L) 4.22 - 5.81 MIL/uL   Hemoglobin 9.8 (L) 13.0 - 17.0 g/dL   HCT 29.6 (L) 39.0 - 52.0 %   MCV 94.9 78.0 - 100.0 fL   MCH 31.4 26.0 - 34.0 pg   MCHC 33.1 30.0 - 36.0 g/dL   RDW 12.6 11.5 - 15.5 %   Platelets 192 150 - 400 K/uL    Comment: Performed at [facility], [place] [address] [place], [place] [address]  Basic metabolic panel     Status: Abnormal   Collection Time: 02/22/18  4:10 AM  Result Value Ref Range   Sodium 140 135 - 145 mmol/L   Potassium 4.6 3.5 - 5.1 mmol/L    Comment: DELTA CHECK NOTED   Chloride 105 98 - 111 mmol/L    Comment: Please note change in reference range.   CO2 25 22 - 32 mmol/L   Glucose, Bld 273 (H) 70 - 99 mg/dL    Comment: Please note change in reference range.   BUN 23 8 - 23 mg/dL    Comment: Please note change in reference range.   Creatinine, Ser 1.37 (H) 0.61 - 1.24 mg/dL   Calcium 8.5 (L) 8.9 - 10.3 mg/dL   GFR calc non Af Amer 50 (L) >60 mL/min   GFR calc Af Amer 58 (L) >60 mL/min    Comment: (NOTE) The eGFR has been calculated using the CKD EPI equation. This calculation has not been validated in all clinical situations. eGFR's persistently <60 mL/min signify possible Chronic Kidney Disease.    Anion gap 10 5 - 15    Comment: Performed at [facility] [address] [place], [place] [address]  Glucose, capillary     Status: Abnormal   Collection Time: 02/22/18  6:29 AM  Result Value Ref Range   Glucose-Capillary 255 (H) 70 - 99 mg/dL  Glucose, capillary     Status: Abnormal   Collection Time: 02/22/18 11:24 AM  Result Value Ref Range   Glucose-Capillary 360 (H) 70 - 99 mg/dL  Glucose, capillary     Status: Abnormal   Collection Time: 02/22/18  4:11 PM  Result Value Ref Range   Glucose-Capillary 191 (H) 70 - 99 mg/dL  Glucose, capillary  Status: Abnormal   Collection Time: 02/22/18  9:38 PM  Result Value Ref Range   Glucose-Capillary 195 (H) 70 - 99 mg/dL  CBC     Status: Abnormal   Collection Time:  02/23/18  3:46 AM  Result Value Ref Range   WBC 8.4 4.0 - 10.5 K/uL   RBC 2.85 (L) 4.22 - 5.81 MIL/uL   Hemoglobin 8.8 (L) 13.0 - 17.0 g/dL   HCT 27.3 (L) 39.0 - 52.0 %   MCV 95.8 78.0 - 100.0 fL   MCH 30.9 26.0 - 34.0 pg   MCHC 32.2 30.0 - 36.0 g/dL   RDW 12.8 11.5 - 15.5 %   Platelets 215 150 - 400 K/uL    Comment: Performed at [facility], [place]. [address], [place], [place] [address]  Basic metabolic panel     Status: Abnormal   Collection Time: 02/23/18  3:46 AM  Result Value Ref Range   Sodium 140 135 - 145 mmol/L   Potassium 4.0 3.5 - 5.1 mmol/L   Chloride 105 98 - 111 mmol/L    Comment: Please note change in reference range.   CO2 28 22 - 32 mmol/L   Glucose, Bld 222 (H) 70 - 99 mg/dL    Comment: Please note change in reference range.   BUN 23 8 - 23 mg/dL    Comment: Please note change in reference range.   Creatinine, Ser 1.18 0.61 - 1.24 mg/dL   Calcium 8.1 (L) 8.9 - 10.3 mg/dL   GFR calc non Af Amer >60 >60 mL/min   GFR calc Af Amer >60 >60 mL/min    Comment: (NOTE) The eGFR has been calculated using the CKD EPI equation. This calculation has not been validated in all clinical situations. eGFR's persistently <60 mL/min signify possible Chronic Kidney Disease.    Anion gap 7 5 - 15    Comment: Performed at [facility] [address], [place], [place] [address]  Glucose, capillary     Status: Abnormal   Collection Time: 02/23/18  7:29 AM  Result Value Ref Range   Glucose-Capillary 205 (H) 70 - 99 mg/dL     Lipid Panel     Component Value Date/Time   CHOL 136 11/18/2017 0534   TRIG 121 11/18/2017 0534   HDL 50 11/18/2017 0534   CHOLHDL 2.7 11/18/2017 0534   VLDL 24 11/18/2017 0534   LDLCALC 62 11/18/2017 0534     Lab Results  Component Value Date   HGBA1C 9.6 (H) 11/17/2017   HGBA1C 10.9 09/15/2017     Lab Results  Component Value Date   LDLCALC 62 11/18/2017   CREATININE 1.18 02/23/2018     HPI :*  Richard Powers is  an 72 y.o. male withhistory of dementia, diabetes mellitus type 2, anemia, chronic kidney disease, hyperlipidemia was brought to the ER the patient had an unwitnessed fall at the nursing facility. Found to have a hip fracture-- had operative repair on 6/25.  Plan to return to SNF.     HOSPITAL COURSE:     Left hip fracture  -circumstances of patient's fall is unknown. - Status post left hip intramedullary nailing 6/25  -PT eval recommended SNF - Dressing changes as per Dr. Stann Mainland, orthopedics, follow-up with him as advised by him -Vicodin for pain control Aspirin 81 twice a day for DVT prophylaxis   Hypertension Started on Norvasc   Diabetes mellitus type 2- -SSI, continue  -resume orals today  Dementia  -  severe -from SNF -palliative care to follow at SNF  Chronic kidney disease stage II appears to be at baseline.cr 1.18  Chronic anemia likely from renal disease -CBC  Baseline around 10, hg 8.8     Discharge Exam:   Blood pressure (!) 165/92, pulse 74, temperature 98.6 F (37 C), temperature source Oral, resp. rate 18, height '5\' 7"'$  (1.702 m), weight 65.9 kg (145 lb 4.5 oz), SpO2 100 %.  Cardiovascular: S1-S2 heard no murmurs appreciated. Abdomen: Soft nontender bowel sounds present. Musculoskeletal: Pain on moving left hip. Skin: No rash. Neurologic: Alert awake oriented to name.  Moves all extremities.  Psychiatric: Oriented to his name.      Follow-up Information    Nicholes Stairs, MD In 2 weeks.   Specialty:  Orthopedic Surgery Why:  For suture removal, For wound re-check Contact information: [address] [place] [address] [phone]           Signed: Reyne Dumas 02/23/2018, 10:02 AM

## 2018-02-23 NOTE — Plan of Care (Signed)
  Problem: Education: Goal: Knowledge of General Education information will improve Outcome: Progressing   Problem: Clinical Measurements: Goal: Will remain free from infection Outcome: Progressing   Problem: Activity: Goal: Risk for activity intolerance will decrease Outcome: Progressing   Problem: Nutrition: Goal: Adequate nutrition will be maintained Outcome: Progressing   Problem: Pain Managment: Goal: General experience of comfort will improve Outcome: Progressing   Problem: Safety: Goal: Ability to remain free from injury will improve Outcome: Progressing   

## 2018-02-23 NOTE — Clinical Social Work Placement (Signed)
   CLINICAL SOCIAL WORK PLACEMENT  NOTE  Date:  02/23/2018  Patient Details  Name: Richard Powers MRN: [id number] Date of Birth: 1945-11-02  Clinical Social Work is seeking post-discharge placement for this patient at the Skilled  Nursing Facility level of care (*CSW will initial, date and re-position this form in  chart as items are completed):  Yes   Patient/family provided with [facility] Clinical Social Work Department's list of facilities offering this level of care within the geographic area requested by the patient (or if unable, by the patient's family).  Yes   Patient/family informed of their freedom to choose among providers that offer the needed level of care, that participate in Medicare, Medicaid or managed care program needed by the patient, have an available bed and are willing to accept the patient.  Yes   Patient/family informed of [place]'s ownership interest in [facility] and [facility], as well as of the fact that they are under no obligation to receive care at these facilities.  PASRR submitted to EDS on       PASRR number received on       Existing PASRR number confirmed on 02/22/18     FL2 transmitted to all facilities in geographic area requested by pt/family on       FL2 transmitted to all facilities within larger geographic area on 02/22/18     Patient informed that his/her managed care company has contracts with or will negotiate with certain facilities, including the following:        Yes   Patient/family informed of bed offers received.  [facility]     Physician recommends and patient chooses bed at      [facility].  Patient to be transferred to facility by [facility]     Patient family notified on 02/23/18 of transfer.  Name of family member notified:  left msg for neice.      PHYSICIAN       Additional Comment:     _______________________________________________ Tresa MoorePatricia V Sheilla Maris, LCSW 02/23/2018, 10:25 AM

## 2018-02-23 NOTE — Progress Notes (Signed)
Patient discharging today to [facility]. Report called in to facility. No c/o pain or discomfort at this time. Awaiting transportation.

## 2018-02-23 NOTE — Social Work (Addendum)
CSW contacted SNF and they confirmed receipt of the Insurance Auth for SNF placement.  CSW called family member and left message advising of same.  CSW will f/u for dc.  Keene BreathPatricia Keigan Tafoya, LCSW Clinical Social Worker [phone]

## 2018-02-23 NOTE — Care Management Important Message (Signed)
Important Message  Patient Details  Name: Richard Powers MRN: [id number] Date of Birth: [birth year]   Medicare Important Message Given:  Yes  Patient refused to sign  Dorena BodoIris Kacyn Souder 02/23/2018, 2:33 PM

## 2018-05-17 ENCOUNTER — Emergency Department (HOSPITAL_COMMUNITY): Payer: Medicare Other

## 2018-05-17 ENCOUNTER — Encounter (HOSPITAL_COMMUNITY): Payer: Self-pay | Admitting: Emergency Medicine

## 2018-05-17 ENCOUNTER — Inpatient Hospital Stay (HOSPITAL_COMMUNITY)
Admission: EM | Admit: 2018-05-17 | Discharge: 2018-05-23 | DRG: 682 | Disposition: A | Discharge status: Skilled Nursing Facility | Payer: Medicare Other | Attending: Internal Medicine | Admitting: Internal Medicine

## 2018-05-17 ENCOUNTER — Inpatient Hospital Stay (HOSPITAL_COMMUNITY): Payer: Medicare Other

## 2018-05-17 DIAGNOSIS — Z79899 Other long term (current) drug therapy: Secondary | ICD-10-CM

## 2018-05-17 DIAGNOSIS — E1110 Type 2 diabetes mellitus with ketoacidosis without coma: Secondary | ICD-10-CM | POA: Diagnosis present

## 2018-05-17 DIAGNOSIS — Z66 Do not resuscitate: Secondary | ICD-10-CM | POA: Diagnosis present

## 2018-05-17 DIAGNOSIS — E785 Hyperlipidemia, unspecified: Secondary | ICD-10-CM | POA: Diagnosis present

## 2018-05-17 DIAGNOSIS — R7989 Other specified abnormal findings of blood chemistry: Secondary | ICD-10-CM | POA: Diagnosis present

## 2018-05-17 DIAGNOSIS — E440 Moderate protein-calorie malnutrition: Secondary | ICD-10-CM | POA: Diagnosis present

## 2018-05-17 DIAGNOSIS — R627 Adult failure to thrive: Secondary | ICD-10-CM | POA: Diagnosis present

## 2018-05-17 DIAGNOSIS — F329 Major depressive disorder, single episode, unspecified: Secondary | ICD-10-CM | POA: Diagnosis present

## 2018-05-17 DIAGNOSIS — Z794 Long term (current) use of insulin: Secondary | ICD-10-CM

## 2018-05-17 DIAGNOSIS — N182 Chronic kidney disease, stage 2 (mild): Secondary | ICD-10-CM

## 2018-05-17 DIAGNOSIS — Z7189 Other specified counseling: Secondary | ICD-10-CM

## 2018-05-17 DIAGNOSIS — N183 Chronic kidney disease, stage 3 (moderate): Secondary | ICD-10-CM | POA: Diagnosis present

## 2018-05-17 DIAGNOSIS — E870 Hyperosmolality and hypernatremia: Secondary | ICD-10-CM | POA: Diagnosis present

## 2018-05-17 DIAGNOSIS — I129 Hypertensive chronic kidney disease with stage 1 through stage 4 chronic kidney disease, or unspecified chronic kidney disease: Secondary | ICD-10-CM | POA: Diagnosis present

## 2018-05-17 DIAGNOSIS — N179 Acute kidney failure, unspecified: Secondary | ICD-10-CM

## 2018-05-17 DIAGNOSIS — Z87891 Personal history of nicotine dependence: Secondary | ICD-10-CM

## 2018-05-17 DIAGNOSIS — Z7982 Long term (current) use of aspirin: Secondary | ICD-10-CM

## 2018-05-17 DIAGNOSIS — F0390 Unspecified dementia without behavioral disturbance: Secondary | ICD-10-CM | POA: Diagnosis present

## 2018-05-17 DIAGNOSIS — E876 Hypokalemia: Secondary | ICD-10-CM | POA: Diagnosis present

## 2018-05-17 DIAGNOSIS — Z515 Encounter for palliative care: Secondary | ICD-10-CM | POA: Diagnosis not present

## 2018-05-17 DIAGNOSIS — H409 Unspecified glaucoma: Secondary | ICD-10-CM | POA: Diagnosis present

## 2018-05-17 DIAGNOSIS — E1122 Type 2 diabetes mellitus with diabetic chronic kidney disease: Secondary | ICD-10-CM | POA: Diagnosis present

## 2018-05-17 DIAGNOSIS — N200 Calculus of kidney: Secondary | ICD-10-CM | POA: Diagnosis present

## 2018-05-17 DIAGNOSIS — E875 Hyperkalemia: Secondary | ICD-10-CM | POA: Diagnosis not present

## 2018-05-17 DIAGNOSIS — G9340 Encephalopathy, unspecified: Secondary | ICD-10-CM | POA: Diagnosis present

## 2018-05-17 DIAGNOSIS — D631 Anemia in chronic kidney disease: Secondary | ICD-10-CM | POA: Diagnosis present

## 2018-05-17 DIAGNOSIS — E1129 Type 2 diabetes mellitus with other diabetic kidney complication: Secondary | ICD-10-CM | POA: Diagnosis present

## 2018-05-17 DIAGNOSIS — Z681 Body mass index (BMI) 19 or less, adult: Secondary | ICD-10-CM | POA: Diagnosis not present

## 2018-05-17 DIAGNOSIS — F0150 Vascular dementia without behavioral disturbance: Secondary | ICD-10-CM | POA: Diagnosis not present

## 2018-05-17 DIAGNOSIS — E871 Hypo-osmolality and hyponatremia: Secondary | ICD-10-CM | POA: Diagnosis present

## 2018-05-17 DIAGNOSIS — I10 Essential (primary) hypertension: Secondary | ICD-10-CM | POA: Diagnosis present

## 2018-05-17 DIAGNOSIS — G9341 Metabolic encephalopathy: Secondary | ICD-10-CM | POA: Diagnosis present

## 2018-05-17 DIAGNOSIS — N3000 Acute cystitis without hematuria: Secondary | ICD-10-CM | POA: Diagnosis present

## 2018-05-17 DIAGNOSIS — E860 Dehydration: Secondary | ICD-10-CM | POA: Diagnosis not present

## 2018-05-17 HISTORY — DX: Major depressive disorder, single episode, unspecified: F32.9

## 2018-05-17 HISTORY — DX: Chronic kidney disease, unspecified: N18.9

## 2018-05-17 HISTORY — DX: Encephalopathy, unspecified: G93.40

## 2018-05-17 HISTORY — DX: Hyperlipidemia, unspecified: E78.5

## 2018-05-17 HISTORY — DX: Depression, unspecified: F32.A

## 2018-05-17 LAB — URINALYSIS, COMPLETE (UACMP) WITH MICROSCOPIC
Bilirubin Urine: NEGATIVE
Glucose, UA: 50 mg/dL — AB
KETONES UR: 5 mg/dL — AB
Nitrite: NEGATIVE
PROTEIN: 30 mg/dL — AB
Specific Gravity, Urine: 1.019 (ref 1.005–1.030)
pH: 5 (ref 5.0–8.0)

## 2018-05-17 LAB — CBC WITH DIFFERENTIAL/PLATELET
Basophils Absolute: 0 10*3/uL (ref 0.0–0.1)
Basophils Relative: 0 %
EOS PCT: 0 %
Eosinophils Absolute: 0 10*3/uL (ref 0.0–0.7)
HEMATOCRIT: 24.8 % — AB (ref 39.0–52.0)
Hemoglobin: 6.8 g/dL — CL (ref 13.0–17.0)
LYMPHS ABS: 0.3 10*3/uL — AB (ref 0.7–4.0)
Lymphocytes Relative: 7 %
MCH: 31.3 pg (ref 26.0–34.0)
MCHC: 27.4 g/dL — AB (ref 30.0–36.0)
MCV: 114.3 fL — ABNORMAL HIGH (ref 78.0–100.0)
MONO ABS: 0.2 10*3/uL (ref 0.1–1.0)
Monocytes Relative: 5 %
NEUTROS ABS: 4.4 10*3/uL (ref 1.7–7.7)
Neutrophils Relative %: 88 %
Platelets: 64 10*3/uL — ABNORMAL LOW (ref 150–400)
RBC: 2.17 MIL/uL — AB (ref 4.22–5.81)
RDW: 14.3 % (ref 11.5–15.5)
WBC: 4.9 10*3/uL (ref 4.0–10.5)

## 2018-05-17 LAB — BASIC METABOLIC PANEL
ANION GAP: 24 — AB (ref 5–15)
BUN: 156 mg/dL — AB (ref 8–23)
BUN: 158 mg/dL — ABNORMAL HIGH (ref 8–23)
BUN: 155 mg/dL — ABNORMAL HIGH (ref 8–23)
CHLORIDE: 124 mmol/L — AB (ref 98–111)
CO2: 11 mmol/L — ABNORMAL LOW (ref 22–32)
CO2: 16 mmol/L — ABNORMAL LOW (ref 22–32)
CO2: 16 mmol/L — ABNORMAL LOW (ref 22–32)
CREATININE: 6.05 mg/dL — AB (ref 0.61–1.24)
CREATININE: 5.44 mg/dL — AB (ref 0.61–1.24)
Calcium: 8.2 mg/dL — ABNORMAL LOW (ref 8.9–10.3)
Calcium: 8.2 mg/dL — ABNORMAL LOW (ref 8.9–10.3)
Calcium: 8.1 mg/dL — ABNORMAL LOW (ref 8.9–10.3)
Creatinine, Ser: 5.78 mg/dL — ABNORMAL HIGH (ref 0.61–1.24)
GFR calc non Af Amer: 8 mL/min — ABNORMAL LOW (ref >60)
GFR calc non Af Amer: 9 mL/min — ABNORMAL LOW (ref >60)
GFR calc non Af Amer: 9 mL/min — ABNORMAL LOW (ref >60)
GFR, EST AFRICAN AMERICAN: 10 mL/min — AB (ref >60)
GFR, EST AFRICAN AMERICAN: 10 mL/min — AB (ref >60)
GFR, EST AFRICAN AMERICAN: 11 mL/min — AB (ref >60)
Glucose, Bld: 318 mg/dL — ABNORMAL HIGH (ref 70–99)
Glucose, Bld: 257 mg/dL — ABNORMAL HIGH (ref 70–99)
Glucose, Bld: 296 mg/dL — ABNORMAL HIGH (ref 70–99)
POTASSIUM: 4.8 mmol/L (ref 3.5–5.1)
Potassium: 4.9 mmol/L (ref 3.5–5.1)
Potassium: 4.5 mmol/L (ref 3.5–5.1)
SODIUM: 169 mmol/L — AB (ref 135–145)
SODIUM: 164 mmol/L — AB (ref 135–145)
Sodium: 169 mmol/L (ref 135–145)

## 2018-05-17 LAB — CBC
HCT: 43.4 % (ref 39.0–52.0)
HEMATOCRIT: 44.3 % (ref 39.0–52.0)
Hemoglobin: 12.9 g/dL — ABNORMAL LOW (ref 13.0–17.0)
Hemoglobin: 12.9 g/dL — ABNORMAL LOW (ref 13.0–17.0)
MCH: 31.2 pg (ref 26.0–34.0)
MCH: 30.6 pg (ref 26.0–34.0)
MCHC: 29.7 g/dL — ABNORMAL LOW (ref 30.0–36.0)
MCHC: 29.1 g/dL — ABNORMAL LOW (ref 30.0–36.0)
MCV: 104.8 fL — AB (ref 78.0–100.0)
MCV: 105 fL — AB (ref 78.0–100.0)
PLATELETS: 149 10*3/uL — AB (ref 150–400)
Platelets: 122 10*3/uL — ABNORMAL LOW (ref 150–400)
RBC: 4.14 MIL/uL — ABNORMAL LOW (ref 4.22–5.81)
RBC: 4.22 MIL/uL (ref 4.22–5.81)
RDW: 14.3 % (ref 11.5–15.5)
RDW: 14.2 % (ref 11.5–15.5)
WBC: 10.2 10*3/uL (ref 4.0–10.5)
WBC: 5.8 10*3/uL (ref 4.0–10.5)

## 2018-05-17 LAB — COMPREHENSIVE METABOLIC PANEL
ALT: 38 U/L (ref 0–44)
AST: 20 U/L (ref 15–41)
Albumin: 2.7 g/dL — ABNORMAL LOW (ref 3.5–5.0)
Alkaline Phosphatase: 137 U/L — ABNORMAL HIGH (ref 38–126)
Anion gap: 26 — ABNORMAL HIGH (ref 5–15)
BILIRUBIN TOTAL: 2.3 mg/dL — AB (ref 0.3–1.2)
BUN: 166 mg/dL — AB (ref 8–23)
CHLORIDE: 129 mmol/L — AB (ref 98–111)
CO2: 13 mmol/L — ABNORMAL LOW (ref 22–32)
CREATININE: 6.5 mg/dL — AB (ref 0.61–1.24)
Calcium: 8.2 mg/dL — ABNORMAL LOW (ref 8.9–10.3)
GFR calc Af Amer: 9 mL/min — ABNORMAL LOW (ref >60)
GFR, EST NON AFRICAN AMERICAN: 8 mL/min — AB (ref >60)
Glucose, Bld: 435 mg/dL — ABNORMAL HIGH (ref 70–99)
Potassium: 6.1 mmol/L — ABNORMAL HIGH (ref 3.5–5.1)
Sodium: 168 mmol/L (ref 135–145)
Total Protein: 5.4 g/dL — ABNORMAL LOW (ref 6.5–8.1)

## 2018-05-17 LAB — GLUCOSE, CAPILLARY
GLUCOSE-CAPILLARY: 237 mg/dL — AB (ref 70–99)
Glucose-Capillary: 229 mg/dL — ABNORMAL HIGH (ref 70–99)
Glucose-Capillary: 245 mg/dL — ABNORMAL HIGH (ref 70–99)
Glucose-Capillary: 265 mg/dL — ABNORMAL HIGH (ref 70–99)

## 2018-05-17 LAB — TYPE AND SCREEN
ABO/RH(D): O POS
ANTIBODY SCREEN: NEGATIVE

## 2018-05-17 LAB — ETHANOL: Alcohol, Ethyl (B): <10 mg/dL (ref <10)

## 2018-05-17 LAB — RAPID URINE DRUG SCREEN, HOSP PERFORMED
Amphetamines: NOT DETECTED
BENZODIAZEPINES: NOT DETECTED
Barbiturates: NOT DETECTED
Cocaine: NOT DETECTED
Opiates: NOT DETECTED
Tetrahydrocannabinol: NOT DETECTED

## 2018-05-17 LAB — CBG MONITORING, ED: Glucose-Capillary: 435 mg/dL — ABNORMAL HIGH (ref 70–99)

## 2018-05-17 LAB — I-STAT ARTERIAL BLOOD GAS, ED
ACID-BASE DEFICIT: 16 mmol/L — AB (ref 0.0–2.0)
Bicarbonate: 9.1 mmol/L — ABNORMAL LOW (ref 20.0–28.0)
O2 SAT: 99 %
PH ART: 7.289 — AB (ref 7.350–7.450)
PO2 ART: 168 mmHg — AB (ref 83.0–108.0)
Patient temperature: 97.2
TCO2: 10 mmol/L — ABNORMAL LOW (ref 22–32)
pCO2 arterial: 18.8 mmHg — CL (ref 32.0–48.0)

## 2018-05-17 LAB — CK: Total CK: 68 U/L (ref 49–397)

## 2018-05-17 LAB — LACTIC ACID, PLASMA
Lactic Acid, Venous: 2.9 mmol/L (ref 0.5–1.9)
Lactic Acid, Venous: 2.4 mmol/L (ref 0.5–1.9)

## 2018-05-17 LAB — I-STAT CG4 LACTIC ACID, ED: LACTIC ACID, VENOUS: 3.75 mmol/L — AB (ref 0.5–1.9)

## 2018-05-17 LAB — MAGNESIUM: Magnesium: 1.2 mg/dL — ABNORMAL LOW (ref 1.7–2.4)

## 2018-05-17 LAB — PREPARE RBC (CROSSMATCH)

## 2018-05-17 LAB — MRSA PCR SCREENING: MRSA by PCR: NEGATIVE

## 2018-05-17 LAB — AMMONIA: AMMONIA: 44 umol/L — AB (ref 9–35)

## 2018-05-17 MED ORDER — ONDANSETRON HCL 4 MG/2ML IJ SOLN: 4.0000 mg | Freq: Four times a day (QID) | INTRAMUSCULAR | Status: DC | PRN

## 2018-05-17 MED ORDER — ACETAMINOPHEN 325 MG PO TABS: 650.0000 mg | ORAL_TABLET | Freq: Four times a day (QID) | ORAL | Status: DC | PRN

## 2018-05-17 MED ORDER — INSULIN ASPART 100 UNIT/ML ~~LOC~~ SOLN
0.0000 [IU] | Freq: Three times a day (TID) | SUBCUTANEOUS | Status: DC
  Administered 2018-05-17: 3 [IU] via SUBCUTANEOUS
  Administered 2018-05-18: 2 [IU] via SUBCUTANEOUS

## 2018-05-17 MED ORDER — INSULIN ASPART 100 UNIT/ML ~~LOC~~ SOLN
10.0000 [IU] | Freq: Once | SUBCUTANEOUS | Status: AC
  Administered 2018-05-17: 10 [IU] via INTRAVENOUS
  Filled 2018-05-17: qty 1

## 2018-05-17 MED ORDER — ACETAMINOPHEN 650 MG RE SUPP: 650.0000 mg | Freq: Four times a day (QID) | RECTAL | Status: DC | PRN

## 2018-05-17 MED ORDER — MAGNESIUM SULFATE 2 GM/50ML IV SOLN
2.0000 g | Freq: Once | INTRAVENOUS | Status: AC
  Administered 2018-05-17: 2 g via INTRAVENOUS
  Filled 2018-05-17: qty 50

## 2018-05-17 MED ORDER — POLYVINYL ALCOHOL 1.4 % OP SOLN
1.0000 [drp] | OPHTHALMIC | Status: DC | PRN
  Filled 2018-05-17: qty 15

## 2018-05-17 MED ORDER — SODIUM CHLORIDE 0.9 % IV SOLN
1.0000 g | Freq: Once | INTRAVENOUS | Status: AC
  Administered 2018-05-17: 1 g via INTRAVENOUS
  Filled 2018-05-17: qty 10

## 2018-05-17 MED ORDER — ONDANSETRON HCL 4 MG PO TABS: 4.0000 mg | ORAL_TABLET | Freq: Four times a day (QID) | ORAL | Status: DC | PRN

## 2018-05-17 MED ORDER — NALOXONE HCL 0.4 MG/ML IJ SOLN
0.4000 mg | Freq: Once | INTRAMUSCULAR | Status: AC
  Administered 2018-05-17: 0.4 mg via INTRAVENOUS

## 2018-05-17 MED ORDER — INSULIN ASPART 100 UNIT/ML ~~LOC~~ SOLN
0.0000 [IU] | Freq: Every day | SUBCUTANEOUS | Status: DC
  Administered 2018-05-17: 3 [IU] via SUBCUTANEOUS

## 2018-05-17 MED ORDER — SODIUM CHLORIDE 0.9 % IV SOLN
INTRAVENOUS | Status: DC
  Administered 2018-05-17: 10:00:00 via INTRAVENOUS

## 2018-05-17 MED ORDER — NALOXONE HCL 0.4 MG/ML IJ SOLN
INTRAMUSCULAR | Status: AC
  Filled 2018-05-17: qty 1

## 2018-05-17 MED ORDER — SODIUM CHLORIDE 0.9% IV SOLUTION
Freq: Once | INTRAVENOUS | Status: AC
  Administered 2018-05-17: 17:00:00 via INTRAVENOUS

## 2018-05-17 MED ORDER — SODIUM CHLORIDE 0.9 % IV BOLUS (SEPSIS)
1000.0000 mL | Freq: Once | INTRAVENOUS | Status: AC
  Administered 2018-05-17: 1000 mL via INTRAVENOUS

## 2018-05-17 MED ORDER — SODIUM CHLORIDE 0.45 % IV SOLN
INTRAVENOUS | Status: DC
  Filled 2018-05-17 (×2): qty 1000

## 2018-05-17 MED ORDER — STERILE WATER FOR INJECTION IV SOLN
INTRAVENOUS | Status: DC
  Administered 2018-05-17 – 2018-05-18 (×3): via INTRAVENOUS
  Filled 2018-05-17 (×9): qty 925

## 2018-05-17 MED ORDER — FLEET ENEMA 7-19 GM/118ML RE ENEM: 1.0000 | ENEMA | Freq: Once | RECTAL | Status: DC | PRN

## 2018-05-17 MED ORDER — BISACODYL 10 MG RE SUPP: 10.0000 mg | Freq: Every day | RECTAL | Status: DC | PRN

## 2018-05-17 MED ORDER — SODIUM CHLORIDE 0.9% FLUSH
3.0000 mL | Freq: Two times a day (BID) | INTRAVENOUS | Status: DC
  Administered 2018-05-17 – 2018-05-23 (×10): 3 mL via INTRAVENOUS

## 2018-05-17 NOTE — ED Notes (Signed)
Critical I-stat Chem-8 results given to Bebe ShaggyWickline, MD

## 2018-05-17 NOTE — ED Notes (Signed)
Only 30ml urine noted during I/O cath. Sacral wound approx size of quarter noted on buttocks area.

## 2018-05-17 NOTE — ED Notes (Signed)
Called the pts niece to obtain consent for the pt to receive a blood transfusion. Verified with two RNs on the phone that the niece verified consent for the pt to receive the blood transfusion.

## 2018-05-17 NOTE — ED Notes (Addendum)
Pt resting in bed  No distress noted

## 2018-05-17 NOTE — Consult Note (Addendum)
Reason for Consult: AKI/CKD stage 3 and multiple electrolyte abnormalities Referring Physician: Ophelia CharterYates, MD  Richard Powers is an 72 y.o. male.  HPI: Richard Powers is a 72 yo AAM with a PMH significant for HTN, DM, HLD, CKD stage 3, and dementia who was [facility] with AMS from his SNF.  According to the records, his AMS started on MOnday and has progressed over the last several days.  He was ambulatory until this week and has been bedbound and not eating or drinking.  In the ED he was noted to have severe hypernatremia with Na level of 163, hyperkalemia with K of 6.3 and AKI/CKD with a BUN/Cr of 166/6.5.  He also has a metabolic acidosis with CO2 of 11.  We were consulted to help further evaluate and manage his AKI/CKD and metabolic derangements.  Pt is currently nonverbal and unresponsive.  Unable to obtain an history from the patient and this information was obtained after reviewing the available medical records.  He has not been on an ACE/ARB at the SNF, nor was he on any other nephrotoxic agents.  No recent exposure to IV contrast was noted.  The trend in Scr is seen below.    Trend in Creatinine: Creatinine, Ser  Date/Time Value Ref Range Status  05/17/2018 11:27 AM 6.05 (H) 0.61 - 1.24 mg/dL Final  40/98/119109/18/2019 47:8208:07 AM 6.50 (H) 0.61 - 1.24 mg/dL Final  95/62/130806/27/2019 65:7803:46 AM 1.18 0.61 - 1.24 mg/dL Final  46/96/295206/26/2019 84:1304:10 AM 1.37 (H) 0.61 - 1.24 mg/dL Final  24/40/102706/25/2019 25:3606:58 AM 1.08 0.61 - 1.24 mg/dL Final  64/40/347406/24/2019 25:9502:29 AM 1.29 (H) 0.61 - 1.24 mg/dL Final  63/87/564303/28/2019 32:9504:45 AM 1.38 (H) 0.61 - 1.24 mg/dL Final  18/84/166003/27/2019 63:0110:05 AM 1.32 (H) 0.61 - 1.24 mg/dL Final  60/10/932303/26/2019 55:7306:13 AM 1.37 (H) 0.61 - 1.24 mg/dL Final  22/02/542703/25/2019 06:2307:37 AM 1.45 (H) 0.61 - 1.24 mg/dL Final  76/28/315103/24/2019 76:1606:24 AM 1.66 (H) 0.61 - 1.24 mg/dL Final  07/37/106203/23/2019 69:4805:52 AM 2.02 (H) 0.61 - 1.24 mg/dL Final  54/62/703503/22/2019 00:9305:34 AM 2.76 (H) 0.61 - 1.24 mg/dL Final  81/82/993703/21/2019 16:9601:33 AM 3.21 (H) 0.61 - 1.24 mg/dL Final   78/93/810103/20/2019 75:1001:56 PM 3.44 (H) 0.61 - 1.24 mg/dL Final  25/85/277802/23/2019 24:2308:55 PM 1.34 (H) 0.61 - 1.24 mg/dL Final  53/61/443102/21/2019 54:0012:27 PM 1.27 (H) 0.61 - 1.24 mg/dL Final  86/76/195001/13/2019 93:2606:30 AM 0.99 0.61 - 1.24 mg/dL Final  71/24/580901/07/2018 98:3302:16 AM 1.18 0.61 - 1.24 mg/dL Final  82/50/539701/06/2018 67:3408:00 PM 1.33 (H) 0.61 - 1.24 mg/dL Final  19/37/902401/12/2017 09:7304:56 AM 1.19 0.61 - 1.24 mg/dL Final  53/29/924201/11/2017 68:3409:08 PM 1.00 0.61 - 1.24 mg/dL Final  19/62/229706/25/2016 98:9211:25 PM 1.23 0.61 - 1.24 mg/dL Final    PMH:   Past Medical History:  Diagnosis Date  . CKD (chronic kidney disease)   . Depression   . Diabetes mellitus without complication (HCC)   . Encephalopathy   . Hyperlipidemia   . Hypertension     PSH:   Past Surgical History:  Procedure Laterality Date  . INTRAMEDULLARY (IM) NAIL INTERTROCHANTERIC Left 02/21/2018   Procedure: INTRAMEDULLARY (IM) NAIL INTERTROCHANTRIC;  Surgeon: Yolonda Kidaogers, Jason Patrick, MD;  [facility];  Service: Orthopedics;  Laterality: Left;    Allergies: No Known Allergies  Medications:   Prior to Admission medications   Medication Sig Start Date End Date Taking? Authorizing Provider  acetaminophen (TYLENOL) 500 MG tablet Take 1 tablet (500 mg total) by mouth every 6 (six) hours  as needed. Patient taking differently: Take 500 mg by mouth every 6 (six) hours as needed for mild pain, fever or headache.  09/13/17  Yes Buriev, Isaiah Serge, MD  amLODipine (NORVASC) 10 MG tablet Take 1 tablet (10 mg total) by mouth daily. 02/24/18  Yes Richarda Overlie, MD  docusate sodium (COLACE) 100 MG capsule Take 1 capsule (100 mg total) by mouth 2 (two) times daily. 02/23/18  Yes Richarda Overlie, MD  donepezil (ARICEPT) 10 MG tablet Take 1 tablet (10 mg total) by mouth at bedtime. 11/24/17  Yes Rhetta Mura, MD  HYDROcodone-acetaminophen (NORCO) 7.5-325 MG tablet Take 1-2 tablets by mouth every 4 (four) hours as needed for severe pain (pain score 7-10). Patient taking differently: Take 1-2 tablets by mouth See  admin instructions. Take 1 tablet by mouth every four hours as needed for pain on a scale from 4-6 and 2 tablets for pain on a scale from 7-10 02/23/18  Yes Abrol, Germain Osgood, MD  insulin regular (NOVOLIN R,HUMULIN R) 100 units/mL injection Inject 2-8 Units into the skin See admin instructions. Inject 2-8 units into the skin three times a day BEFORE meals, per sliding scale: BGL 201-250 = 2 units; 251-300 = 4 units; 301-350 = 6 units; 351-400 = 8 units   Yes [provider]  megestrol (MEGACE) 400 MG/10ML suspension Take 10 mLs (400 mg total) by mouth 2 (two) times daily. 11/24/17  Yes Rhetta Mura, MD  metFORMIN (GLUCOPHAGE) 500 MG tablet Take 500 mg by mouth 2 (two) times daily with a meal.   Yes [provider]  mirtazapine (REMERON) 7.5 MG tablet Take 7.5 mg by mouth at bedtime.   Yes [provider]  NON FORMULARY Mighty Shake: Drink 1 shake by mouth three times a day with meals   Yes [provider]  Nutritional Supplements (RESOURCE 2.0) LIQD Take 120 mLs by mouth 2 (two) times daily.   Yes [provider]  polyethylene glycol (MIRALAX / GLYCOLAX) packet Take 17 g by mouth daily. 02/24/18  Yes Richarda Overlie, MD  polyvinyl alcohol (LIQUIFILM TEARS) 1.4 % ophthalmic solution Place 1 drop into both eyes as needed for dry eyes. 11/24/17  Yes Rhetta Mura, MD  PRESCRIPTION MEDICATION Lorazepam 0.5 mg/ml gel: Apply 1 ml (0.5 mg) topically two times a day as needed for anxiety or agitation   Yes [provider]  senna-docusate (SENOKOT-S) 8.6-50 MG tablet Take 1 tablet by mouth 2 (two) times daily. 02/23/18  Yes Richarda Overlie, MD  sertraline (ZOLOFT) 50 MG tablet Take 50 mg by mouth daily.   Yes [provider]  simvastatin (ZOCOR) 40 MG tablet Take 1 tablet (40 mg total) by mouth daily. 10/21/17  Yes [name], MD  thiamine 100 MG tablet Take 1 tablet (100 mg total) by mouth daily. 11/24/17  Yes Rhetta Mura, MD     Inpatient medications: . sodium chloride   Intravenous Once  . insulin aspart  0-5 Units Subcutaneous QHS  . insulin aspart  0-9 Units Subcutaneous TID WC  . sodium chloride flush  3 mL Intravenous Q12H    Discontinued Meds:   Medications Discontinued During This Encounter  Medication Reason  . sertraline (ZOLOFT) 25 MG tablet Dose change  . Nutritional Supplements (NUTRITIONAL DRINK PO) Duplicate  . 0.9 %  sodium chloride infusion   . feeding supplement, ENSURE ENLIVE, (ENSURE ENLIVE) LIQD   . hydrocerin (EUCERIN) CREA   . aspirin EC 81 MG EC tablet   . sodium chloride 0.45 %  1,000 mL with sodium bicarbonate 100 mEq infusion     Social History:  reports that he has quit smoking. He has never used smokeless tobacco. He reports that he drinks alcohol. He reports that he does not use drugs.  Family History:   Family History  Family history unknown: Yes    Review of systems not obtained due to patient factors. Weight change:   Intake/Output Summary (Last 24 hours) at 05/17/2018 1252 Last data filed at 05/17/2018 1053 Gross per 24 hour  Intake 2760 ml  Output -  Net 2760 ml   BP (!) 137/97   Pulse (!) 130   Temp 99.8 F (37.7 C) (Rectal)   Resp 14   Ht 5\' 8"  (1.727 m)   Wt 45.4 kg   SpO2 100%   BMI 15.20 kg/m  Vitals:   05/17/18 1100 05/17/18 1115 05/17/18 1145 05/17/18 1215  BP: (!) 138/105 (!) 142/96 (!) 157/96 (!) 137/97  Pulse: (!) 111 (!) 130 (!) 118 (!) 130  Resp: 16 11 19 14   Temp:      TempSrc:      SpO2: 100% 100% 100% 100%  Weight:      Height:         General appearance: unresponsive Head: Normocephalic, without obvious abnormality, atraumatic Eyes: negative findings: lids and lashes normal, conjunctivae and sclerae normal and corneas clear Neck: no adenopathy, no carotid bruit, no JVD, supple, symmetrical, trachea midline and thyroid not enlarged, symmetric, no tenderness/mass/nodules Resp: clear to auscultation bilaterally Cardio: tachycardic  at 130, no rub GI: soft, non-tender; bowel sounds normal; no masses,  no organomegaly Extremities: extremities normal, atraumatic, no cyanosis or edema  Labs: Basic Metabolic Panel: Recent Labs  Lab 05/17/18 0807 05/17/18 1127  NA 168* 169*  K 6.1* 4.9  CL 129* >130*  CO2 13* 11*  GLUCOSE 435* 318*  BUN 166* 156*  CREATININE 6.50* 6.05*  ALBUMIN 2.7*  --   CALCIUM 8.2* 8.2*   Liver Function Tests: Recent Labs  Lab 05/17/18 0807  AST 20  ALT 38  ALKPHOS 137*  BILITOT 2.3*  PROT 5.4*  ALBUMIN 2.7*   No results for input(s): LIPASE, AMYLASE in the last 168 hours. Recent Labs  Lab 05/17/18 0524  AMMONIA 44*   CBC: Recent Labs  Lab 05/17/18 0525 05/17/18 0807  WBC 4.9 10.2  NEUTROABS 4.4  --   HGB 6.8* 12.9*  HCT 24.8* 43.4  MCV 114.3* 104.8*  PLT 64* 149*   PT/INR: @LABRCNTIP (inr:5) Cardiac Enzymes: )No results for input(s): CKTOTAL, CKMB, CKMBINDEX, TROPONINI in the last 168 hours. CBG: Recent Labs  Lab 05/17/18 0515  GLUCAP 435*    Iron Studies: No results for input(s): IRON, TIBC, TRANSFERRIN, FERRITIN in the last 168 hours.  Xrays/Other Studies: Ct Head Wo Contrast  Result Date: 05/17/2018 CLINICAL DATA:  Unresponsive for 2 days. Unable to obtain a blood pressure. Right-sided facial droop. EXAM: CT HEAD WITHOUT CONTRAST TECHNIQUE: Contiguous axial images were obtained from the base of the skull through the vertex without intravenous contrast. COMPARISON:  02/20/2018 FINDINGS: Brain: Diffuse cerebral atrophy. Ventricular dilatation consistent with central atrophy. Cavum septum pellucidum. Low-attenuation changes throughout the deep white matter consistent with small vessel ischemic changes. No mass-effect or midline shift. No abnormal extra-axial fluid collections. Gray-white matter junctions are distinct. Basal cisterns are not effaced. No acute intracranial hemorrhage. Vascular: Mild intracranial arterial calcifications are present. Skull: Calvarium  appears intact. Sinuses/Orbits: Paranasal sinuses and mastoid air cells are clear. Other: None. IMPRESSION:  No acute intracranial abnormalities. Chronic atrophy and small vessel ischemic changes. Electronically Signed   By: Burman Nieves M.D.   On: 05/17/2018 06:09   Dg Chest Port 1 View  Result Date: 05/17/2018 CLINICAL DATA:  Initial evaluation for acute unresponsiveness. EXAM: PORTABLE CHEST 1 VIEW COMPARISON:  Prior radiograph from 02/20/2018. FINDINGS: Cardiac and mediastinal silhouettes are stable in size and contour, and remain within normal limits. Aortic atherosclerosis. Lungs hypoinflated. No focal infiltrate, pulmonary edema or pleural effusion. No pneumothorax. No acute osseus abnormality. IMPRESSION: 1. No active cardiopulmonary disease. 2. Aortic atherosclerosis. Electronically Signed   By: Rise Mu M.D.   On: 05/17/2018 05:43     Assessment/Plan: 1.  AKI/CKD stage 3- appears to be in the setting of severe volume depletion and dehydration.  Agree with IVF"s but will change to sterile water with 2 amps of bicarb to help with hypernatremia and metabolic acidosis.  Will also check renal US and would hold off on HD since his potassium has already improved as well as Cr after 2 liters of NS. 2. Metabolic acidosis- due to #1 and metformin.  Stop metformin and start sterile water with 2 amps of bicarb and follow  3. Lactic acidosis- due to AKI in setting of concomitant metformin.  Would not restart metformin.  Treat with bicarb gtt as above. 4. Hypernatremia due to dehydration- free water deficit is over 4 liters.  Start hypotonic bicarb and follow 5. Hyperkalemia- due to #1 and #2, already improving. 6. HTN- stable 7. Moderate protein malnutrition- will need to start protein supplements when able to take po 8. Dementia- per primary svc. 9. DM- hyperglycemic.  Start insulin per primary svc 10. Anemia- possibly due to CKD stage 3 but also need to r/o GIB.  Increased Hgb from  baseline of 9-10 likely due to dehydration and concentration effect.  Follow h/h and transfuse prn. 11. FTT- consider palliative care consult to help set goals/limits of care in an elderly, SNF resident with progressive dementia who is a full code.   Julien Nordmann Chaston Bradburn 05/17/2018, 12:52 PM

## 2018-05-17 NOTE — ED Notes (Addendum)
Spoke to the attending provider about hemoglobin level. MD wants to recollect another CBC to test the accuracy of the hemoglobin.

## 2018-05-17 NOTE — ED Notes (Addendum)
[facility] for [facility]. Facility decided to call 911 today after they could not obtain a blood pressure. EMS initiated IV access and inserted NPA. Pt moving R arm occasionally to rub his face. R sided facial droop noted.

## 2018-05-17 NOTE — ED Notes (Signed)
Order placed for IV team, difficult stick and blood draw

## 2018-05-17 NOTE — ED Notes (Addendum)
Date and time results received: 05/17/18 0912 (use smartphrase ".now" to insert current time)  Test: Sodium Critical Value: 168  Name of Provider Notified: Zackowski   Orders Received? Or Actions Taken?: Orders Received - See Orders for details

## 2018-05-17 NOTE — ED Notes (Signed)
Pt transported to ultrasound.

## 2018-05-17 NOTE — ED Provider Notes (Addendum)
Discussed with family medicine that got the unassigned admission.  I do not feel comfortable admitting this patient to their service feel that they need to go to the ICU will discuss with critical care.  Patient's oxygen is fine but has not been hypotensive.  Does have significant altered mental status does have a acute kidney injury.  Does have hypernatremia as well as hyperkalemia.  Upper kalemia being addressed with calcium gluconate as well as insulin blood sugar was elevated as well.  Patient's chest x-ray and head CT without any significant findings.  Blood gas is consistent with a significant metabolic acidosis.  Patient does seem to be protecting his airway fine.  He has been here for approximately 5 hours.  Will discuss with the ICU team.  CRITICAL CARE Performed by: Vanetta MuldersScott Yousaf Sainato Total critical care time: 30 minutes Critical care time was exclusive of separately billable procedures and treating other patients. Critical care was necessary to treat or prevent imminent or life-threatening deterioration. Critical care was time spent personally by me on the following activities: development of treatment plan with patient and/or surrogate as well as nursing, discussions with consultants, evaluation of patient's response to treatment, examination of patient, obtaining history from patient or surrogate, ordering and performing treatments and interventions, ordering and review of laboratory studies, ordering and review of radiographic studies, pulse oximetry and re-evaluation of patient's condition.    Vanetta MuldersZackowski, Stanislav Gervase, MD 05/17/18 1001    Vanetta MuldersZackowski, Santa Abdelrahman, MD 05/17/18 1001  Addendum: Discussed with critical care specialist.  They do not feel patient warrants ICU admission.  Will recontact family medicine service for admission.    Vanetta MuldersZackowski, Ameen Mostafa, MD 05/17/18 1027  Addendum:.  Please discussed with family medicine resident.  They do not feel comfortable admitting the patient apparently  their attending does not.  I requested that the resident have the attending discussed the situation with critical care.      Vanetta MuldersZackowski, Enedina Pair, MD 05/17/18 1046  Addendum:  Still having resolve the admission issue.  Family medicine silver not wanting to admit.  However in review of charts since having hip done he has been under the care for rehab by Dr. Nehemiah SettlePolite.  This would be a [facility] hospitalist type admission.  Will contact them.  Do not disagree with critical care not wanting to admit him do not feel patient is an ICU player.    Vanetta MuldersZackowski, Malayiah Mcbrayer, MD 05/17/18 1053

## 2018-05-17 NOTE — ED Provider Notes (Signed)
I don't feel this represents sepsis, likely metabolic I was able to draw labs from right femoral vein, no complications, pressure held after procedure    Zadie RhineWickline, Ashtynn Berke, MD 05/17/18 [phone]

## 2018-05-17 NOTE — Progress Notes (Signed)
Spoke to MD and ED RN regarding the Admission to SDU. Patient is unresponsive with severe dehydration as evidence by the sodium levels, blood gas, and renal function. PCCM has seen the patient and dont fell like he is ICU apporpriate at this time. Will admit to SDU with concern for critical nature of patient condition.

## 2018-05-17 NOTE — ED Provider Notes (Signed)
[facility] EMERGENCY DEPARTMENT Provider Note   CSN: [id number] Arrival date & time: 05/17/18  0516     History   Chief Complaint Chief Complaint  Patient presents with  . Altered Mental Status   Level 5 caveat due to altered mental status HPI Richard Powers is a 72 y.o. male.  The history is provided by the EMS personnel and the nursing home. The history is limited by the condition of the patient.  Altered Mental Status   This is a new problem. Episode onset: UnKnown. Associated symptoms include somnolence.  Patient with a history of chronic kidney disease, depression, diabetes, dementia presents with altered mental status.  Per EMS, he has been altered and unresponsive for 2 days.  Tonight EMS was called to take him in for evaluation.  EMS reports they were unable to obtain a blood pressure.  His glucose is over 400.  No other details are known at this time.  Past Medical History:  Diagnosis Date  . CKD (chronic kidney disease)   . Depression   . Diabetes mellitus without complication (HCC)   . Encephalopathy   . Hyperlipidemia   . Hypertension     Patient Active Problem List   Diagnosis Date Noted  . Closed left hip fracture, initial encounter (HCC) 02/20/2018  . CKD (chronic kidney disease) stage 2, GFR 60-89 ml/min 02/20/2018  . Closed left hip fracture (HCC) 02/20/2018  . Dehydration 11/16/2017  . AKI (acute kidney injury) (HCC) 11/16/2017  . Acute encephalopathy 11/16/2017  . Elevated lactic acid level 11/16/2017  . Glaucoma 10/17/2017  . Dementia 09/15/2017  . Sepsis (HCC) 09/09/2017  . Essential hypertension 09/09/2017  . 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 INTRAMEDULLARY (IM) NAIL INTERTROCHANTERIC Left 02/21/2018   Procedure: INTRAMEDULLARY (IM) NAIL INTERTROCHANTRIC;  Surgeon: Yolonda Kida, MD;  Location: [facility] OR;  Service:  Orthopedics;  Laterality: Left;        Home Medications    Prior to Admission medications   Medication Sig Start Date End Date Taking? Authorizing Provider  acetaminophen (TYLENOL) 500 MG tablet Take 1 tablet (500 mg total) by mouth every 6 (six) hours as needed. Patient taking differently: Take 500 mg by mouth every 6 (six) hours as needed for mild pain.  09/13/17   Esperanza Sheets, MD  amLODipine (NORVASC) 10 MG tablet Take 1 tablet (10 mg total) by mouth daily. 02/24/18   Richarda Overlie, MD  aspirin EC 81 MG EC tablet Take 1 tablet (81 mg total) by mouth 2 (two) times daily. 02/23/18   Richarda Overlie, MD  docusate sodium (COLACE) 100 MG capsule Take 1 capsule (100 mg total) by mouth 2 (two) times daily. 02/23/18   Richarda Overlie, MD  donepezil (ARICEPT) 10 MG tablet Take 1 tablet (10 mg total) by mouth at bedtime. 11/24/17   Rhetta Mura, MD  feeding supplement, ENSURE ENLIVE, (ENSURE ENLIVE) LIQD Take 237 mLs by mouth 2 (two) times daily between meals. 11/24/17   Rhetta Mura, MD  hydrocerin (EUCERIN) CREA Apply 1 application topically 2 (two) times daily. 11/24/17   Rhetta Mura, MD  HYDROcodone-acetaminophen (NORCO) 7.5-325 MG tablet Take 1-2 tablets by mouth every 4 (four) hours as needed for severe pain (pain score 7-10). 02/23/18   Richarda Overlie, MD  insulin regular (NOVOLIN R,HUMULIN R) 100 units/mL injection Inject 2-8 Units into the skin See admin instructions. Per sliding scale:  201-250 give 2 units 251-300 give 4 units 301-350 give 6 units 351-400 give 8 units    [provider]  megestrol (MEGACE) 400 MG/10ML suspension Take 10 mLs (400 mg total) by mouth 2 (two) times daily. 11/24/17   Rhetta MuraSamtani, Jai-Gurmukh, MD  metFORMIN (GLUCOPHAGE) 500 MG tablet Take 500 mg by mouth 2 (two) times daily with a meal.    [provider]  Nutritional Supplements (NUTRITIONAL DRINK PO) Take 1 Container by mouth 2 (two) times daily. Mighty Shake    [provider]  polyethylene glycol (MIRALAX / GLYCOLAX) packet Take 17 g by mouth daily. 02/24/18   Richarda OverlieAbrol, Nayana, MD  polyvinyl alcohol (LIQUIFILM TEARS) 1.4 % ophthalmic solution Place 1 drop into both eyes as needed for dry eyes. 11/24/17   Rhetta MuraSamtani, Jai-Gurmukh, MD  senna-docusate (SENOKOT-S) 8.6-50 MG tablet Take 1 tablet by mouth 2 (two) times daily. 02/23/18   Richarda OverlieAbrol, Nayana, MD  sertraline (ZOLOFT) 25 MG tablet Take 25 mg by mouth daily.    [provider]  simvastatin (ZOCOR) 40 MG tablet Take 1 tablet (40 mg total) by mouth daily. 10/21/17   [name], [name], MD  thiamine 100 MG tablet Take 1 tablet (100 mg total) by mouth daily. 11/24/17   Rhetta MuraSamtani, Jai-Gurmukh, MD    Family History Family History  Family history unknown: Yes    Social History Social History   Tobacco Use  . Smoking status: Former Games developermoker  . Smokeless tobacco: Never Used  Substance Use Topics  . Alcohol use: Yes  . Drug use: No     Allergies   Patient has no known allergies.   Review of Systems Review of Systems  Unable to perform ROS: Mental status change     Physical Exam Updated Vital Signs BP 131/84   Pulse (!) 120   Temp (!) 97.2 F (36.2 C)   Resp 19   SpO2 100%   Physical Exam CONSTITUTIONAL: Elderly, ill-appearing, somnolent HEAD: Normocephalic/atraumatic no signs of trauma EYES: PERRL, pupils not pinpoint ENMT: Mucous membranes dry NECK: supple no meningeal signs CV: Tachycardic LUNGS: Lungs are clear to auscultation bilaterally, no apparent distress ABDOMEN: soft, nontender NEURO: Pt is somnolent and minimally responsive to pain and voice.  Questionable right facial droop. EXTREMITIES: pulses normal/equal, no deformities SKIN: warm, color normal PSYCH: Unable to assess ED Treatments / Results  Labs (all labs ordered are listed, but only abnormal results are displayed) Labs Reviewed  CBC WITH DIFFERENTIAL/PLATELET - Abnormal; Notable for the following components:       Result Value   RBC 2.17 (*)    Hemoglobin 6.8 (*)    HCT 24.8 (*)    MCV 114.3 (*)    MCHC 27.4 (*)    Platelets 64 (*)    Lymphs Abs 0.3 (*)    All other components within normal limits  URINALYSIS, COMPLETE (UACMP) WITH MICROSCOPIC - Abnormal; Notable for the following components:   Color, Urine AMBER (*)    APPearance CLOUDY (*)    Glucose, UA 50 (*)    Hgb urine dipstick SMALL (*)    Ketones, ur 5 (*)    Protein, ur 30 (*)    Leukocytes, UA MODERATE (*)    Bacteria, UA RARE (*)    All other components within normal limits  AMMONIA - Abnormal; Notable for the following components:   Ammonia 44 (*)    All other components within normal limits  MAGNESIUM - Abnormal; Notable for the following components:  Magnesium 1.2 (*)    All other components within normal limits  I-STAT ARTERIAL BLOOD GAS, ED - Abnormal; Notable for the following components:   pH, Arterial 7.289 (*)    pCO2 arterial 18.8 (*)    pO2, Arterial 168.0 (*)    Bicarbonate 9.1 (*)    TCO2 10 (*)    Acid-base deficit 16.0 (*)    All other components within normal limits  I-STAT CG4 LACTIC ACID, ED - Abnormal; Notable for the following components:   Lactic Acid, Venous 3.75 (*)    All other components within normal limits  URINE CULTURE  RAPID URINE DRUG SCREEN, HOSP PERFORMED  ETHANOL  COMPREHENSIVE METABOLIC PANEL  CBG MONITORING, ED  I-STAT CHEM 8, ED  I-STAT CHEM 8, ED    EKG EKG Interpretation  Date/Time:  Wednesday May 17 2018 05:17:06 EDT Ventricular Rate:  117 PR Interval:    QRS Duration: 115 QT Interval:  364 QTC Calculation: 508 R Axis:   93 Text Interpretation:  Sinus or ectopic atrial tachycardia Right bundle branch block Borderline low voltage, extremity leads Confirmed by Zadie Rhine ([id number]) on 05/17/2018 5:26:41 AM   Radiology Ct Head Wo Contrast  Result Date: 05/17/2018 CLINICAL DATA:  Unresponsive for 2 days. Unable to obtain a blood pressure. Right-sided facial  droop. EXAM: CT HEAD WITHOUT CONTRAST TECHNIQUE: Contiguous axial images were obtained from the base of the skull through the vertex without intravenous contrast. COMPARISON:  02/20/2018 FINDINGS: Brain: Diffuse cerebral atrophy. Ventricular dilatation consistent with central atrophy. Cavum septum pellucidum. Low-attenuation changes throughout the deep white matter consistent with small vessel ischemic changes. No mass-effect or midline shift. No abnormal extra-axial fluid collections. Gray-white matter junctions are distinct. Basal cisterns are not effaced. No acute intracranial hemorrhage. Vascular: Mild intracranial arterial calcifications are present. Skull: Calvarium appears intact. Sinuses/Orbits: Paranasal sinuses and mastoid air cells are clear. Other: None. IMPRESSION: No acute intracranial abnormalities. Chronic atrophy and small vessel ischemic changes. Electronically Signed   By: Burman Nieves M.D.   On: 05/17/2018 06:09   Dg Chest Port 1 View  Result Date: 05/17/2018 CLINICAL DATA:  Initial evaluation for acute unresponsiveness. EXAM: PORTABLE CHEST 1 VIEW COMPARISON:  Prior radiograph from 02/20/2018. FINDINGS: Cardiac and mediastinal silhouettes are stable in size and contour, and remain within normal limits. Aortic atherosclerosis. Lungs hypoinflated. No focal infiltrate, pulmonary edema or pleural effusion. No pneumothorax. No acute osseus abnormality. IMPRESSION: 1. No active cardiopulmonary disease. 2. Aortic atherosclerosis. Electronically Signed   By: Rise Mu M.D.   On: 05/17/2018 05:43    Procedures Procedures  CRITICAL CARE Performed by: Joya Gaskins Total critical care time: 55 minutes Critical care time was exclusive of separately billable procedures and treating other patients. Critical care was necessary to treat or prevent imminent or life-threatening deterioration. Critical care was time spent personally by me on the following activities: development of  treatment plan with patient and/or surrogate as well as nursing, discussions with consultants, evaluation of patient's response to treatment, examination of patient, obtaining history from patient or surrogate, ordering and performing treatments and interventions, ordering and review of laboratory studies, ordering and review of radiographic studies, pulse oximetry and re-evaluation of patient's condition.   Medications Ordered in ED Medications  cefTRIAXone (ROCEPHIN) 1 g in sodium chloride 0.9 % 100 mL IVPB (1 g Intravenous New Bag/Given 05/17/18 0733)  magnesium sulfate IVPB 2 g 50 mL (has no administration in time range)  naloxone (NARCAN) injection 0.4 mg (0.4 mg Intravenous Given  05/17/18 0533)  sodium chloride 0.9 % bolus 1,000 mL (0 mLs Intravenous Stopped 05/17/18 0658)     Initial Impression / Assessment and Plan / ED Course  I have reviewed the triage vital signs and the nursing notes.  Pertinent labs & imaging results that were available during my care of the patient were reviewed by me and considered in my medical decision making (see chart for details).     5:42 AM Patient brought in from nursing home for unresponsiveness.  When I called [facility], the nighttime nurse reports she does not know patient well.  She reports he has been confused and minimally responsive for the past several days.  No falls have been reported.  When asked about any recent medication changes, she said that I had to call back to the daytime to talk to daytime nursing 6:12 AM Mental status is unchanged.  He will intermittently move his right arm.  CT head is negative.  Stat electrolytes results reveal significant hypernatremia and hypokalemia.  Rest of his labs are pending at this time 6:46 AM D/w niece Angelique Blonder [phone] She reports that she noted her uncle to be very somnolent yesterday.  He was verbal at that time. I updated her on his current condition and the need to be admitted. Per paperwork,  patient is a full code 7:56 AM Mental Status is unchanged.  Patient is resting comfortably on the monitor.  He has been given IV fluids, and is been found to have a UTI, Rocephin has been ordered.  He is also hypomagnesemia, IV magnesium has been ordered.  Unfortunately his labs have all been canceled due to lab error.  Initial Chem-8 reveal potential hypo-kalemia and hypernatremia, but those results are no longer available.  He will need to have repeat electrolyte panel prior to admission  Discussed with Dr. Deretha Emory, he will assume care in sign out to follow-up on final labs and admit patient. He is unassigned patient  Final Clinical Impressions(s) / ED Diagnoses   Final diagnoses:  Dehydration  Metabolic encephalopathy  Hypomagnesemia  Acute cystitis without hematuria    ED Discharge Orders    None       Zadie Rhine, MD 05/17/18 [phone]

## 2018-05-17 NOTE — Progress Notes (Signed)
CRITICAL VALUE ALERT  Critical Value: Sodium 164  Date & Time Notied: 05/17/18 2146  Provider Notified: Donnamarie PoagK. Kirby, NP   Orders Received/Actions taken: No new orders

## 2018-05-17 NOTE — ED Notes (Signed)
Attempted report x1. 

## 2018-05-17 NOTE — H&P (Signed)
History and Physical    Richard Powers JYN:[id number] DOB: [birth year] DOA: 05/17/2018  PCP: [name], MD  Patient coming from: [facility]; NOK: Niece, Richard Powers, [phone]  Chief Complaint: AMS  HPI: Richard Powers is a 72 y.o. male with medical history significant of HTN; HLD; DM; CKD; and dementia presenting with AMS.  He is comatose and unable to respond to questions.  He was unaccompanied at the time of my evaluation.  I spoke to the patient's niece.  She reports that he is able to answer questions when asked, yells out sometimes. From March until now, he has had significant downhill progression. He usually responds to his family.  Last week, he did know who she was.  [birth year] was his birthday and his niece saw him - he was kind of out of it, a very drastic change from last week.  He had a fall at the facility, maybe 9/9.  According to the facility, the PT reported that he was able to walk with a walker very short distances; his niece reports that "he was always in the bed."  The facility told her he wasn't eating well and they gave him a medicine to increase his appetite.  They are supposed to be assisting him with food but "I never saw anyone to help, looked like he was struggling by himself."   He was previously arrested for trespassing, wandering which was thought to be related to dementia, but she is not certain about this.  ED Course:   PCCM does not think this is an ICU patient - likely SDU.  Appears that he "failed to be watered", AMS x 2 days - likely longer.  Acute renal failure.  Head CT, CXR negative.  Initial lactate in 3 range.  +acidosis, low pCO2 from hyperventilation.  Na++ 163, K+ 6.1.  Renal function markedly abnormal compared to baseline.  Given 2L.  Will order f/u BMP.  He likely needs admission to SDU.  Review of Systems: unable to perform  Ambulatory Status:  Ambulated with a walker short distances until recently  Past Medical  History:  Diagnosis Date  . CKD (chronic kidney disease)   . Depression   . Diabetes mellitus without complication (HCC)   . Encephalopathy   . Hyperlipidemia   . Hypertension     Past Surgical History:  Procedure Laterality Date  . INTRAMEDULLARY (IM) NAIL INTERTROCHANTERIC Left 02/21/2018   Procedure: INTRAMEDULLARY (IM) NAIL INTERTROCHANTRIC;  Surgeon: Yolonda Kida, MD;  Location: [facility] OR;  Service: Orthopedics;  Laterality: Left;    Social History   Socioeconomic History  . Marital status: Single    Spouse name: Not on file  . Number of children: Not on file  . Years of education: Not on file  . Highest education level: Not on file  Occupational History  . Not on file  Social Needs  . Financial resource strain: Not on file  . Food insecurity:    Worry: Not on file    Inability: Not on file  . Transportation needs:    Medical: Not on file    Non-medical: Not on file  Tobacco Use  . Smoking status: Former Games developer  . Smokeless tobacco: Never Used  Substance and Sexual Activity  . Alcohol use: Yes  . Drug use: No  . Sexual activity: Yes  Lifestyle  . Physical activity:    Days per week: Not on file    Minutes per session: Not on file  .  Stress: Not on file  Relationships  . Social connections:    Talks on phone: Not on file    Gets together: Not on file    Attends religious service: Not on file    Active member of club or organization: Not on file    Attends meetings of clubs or organizations: Not on file    Relationship status: Not on file  . Intimate partner violence:    Fear of current or ex partner: Not on file    Emotionally abused: Not on file    Physically abused: Not on file    Forced sexual activity: Not on file  Other Topics Concern  . Not on file  Social History Narrative  . Not on file    No Known Allergies  Family History  Family history unknown: Yes    Prior to Admission medications   Medication Sig Start Date End Date Taking?  Authorizing Provider  acetaminophen (TYLENOL) 500 MG tablet Take 1 tablet (500 mg total) by mouth every 6 (six) hours as needed. Patient taking differently: Take 500 mg by mouth every 6 (six) hours as needed for mild pain, fever or headache.  09/13/17  Yes Buriev, Isaiah Serge, MD  amLODipine (NORVASC) 10 MG tablet Take 1 tablet (10 mg total) by mouth daily. 02/24/18  Yes Richarda Overlie, MD  docusate sodium (COLACE) 100 MG capsule Take 1 capsule (100 mg total) by mouth 2 (two) times daily. 02/23/18  Yes Richarda Overlie, MD  donepezil (ARICEPT) 10 MG tablet Take 1 tablet (10 mg total) by mouth at bedtime. 11/24/17  Yes Rhetta Mura, MD  HYDROcodone-acetaminophen (NORCO) 7.5-325 MG tablet Take 1-2 tablets by mouth every 4 (four) hours as needed for severe pain (pain score 7-10). Patient taking differently: Take 1-2 tablets by mouth See admin instructions. Take 1 tablet by mouth every four hours as needed for pain on a scale from 4-6 and 2 tablets for pain on a scale from 7-10 02/23/18  Yes Abrol, Germain Osgood, MD  insulin regular (NOVOLIN R,HUMULIN R) 100 units/mL injection Inject 2-8 Units into the skin See admin instructions. Inject 2-8 units into the skin three times a day BEFORE meals, per sliding scale: BGL 201-250 = 2 units; 251-300 = 4 units; 301-350 = 6 units; 351-400 = 8 units   Yes [provider]  megestrol (MEGACE) 400 MG/10ML suspension Take 10 mLs (400 mg total) by mouth 2 (two) times daily. 11/24/17  Yes Rhetta Mura, MD  metFORMIN (GLUCOPHAGE) 500 MG tablet Take 500 mg by mouth 2 (two) times daily with a meal.   Yes [provider]  mirtazapine (REMERON) 7.5 MG tablet Take 7.5 mg by mouth at bedtime.   Yes [provider]  NON FORMULARY Mighty Shake: Drink 1 shake by mouth three times a day with meals   Yes [provider]  Nutritional Supplements (RESOURCE 2.0) LIQD Take 120 mLs by mouth 2 (two) times daily.   Yes [provider]  polyethylene  glycol (MIRALAX / GLYCOLAX) packet Take 17 g by mouth daily. 02/24/18  Yes Richarda Overlie, MD  polyvinyl alcohol (LIQUIFILM TEARS) 1.4 % ophthalmic solution Place 1 drop into both eyes as needed for dry eyes. 11/24/17  Yes Rhetta Mura, MD  PRESCRIPTION MEDICATION Lorazepam 0.5 mg/ml gel: Apply 1 ml (0.5 mg) topically two times a day as needed for anxiety or agitation   Yes [provider]  senna-docusate (SENOKOT-S) 8.6-50 MG tablet Take 1 tablet by mouth 2 (two) times daily.  02/23/18  Yes Richarda Overlie, MD  sertraline (ZOLOFT) 50 MG tablet Take 50 mg by mouth daily.   Yes [provider]  simvastatin (ZOCOR) 40 MG tablet Take 1 tablet (40 mg total) by mouth daily. 10/21/17  Yes [name], MD  thiamine 100 MG tablet Take 1 tablet (100 mg total) by mouth daily. 11/24/17  Yes Rhetta Mura, MD  aspirin EC 81 MG EC tablet Take 1 tablet (81 mg total) by mouth 2 (two) times daily. Patient not taking: Reported on 05/17/2018 02/23/18   Richarda Overlie, MD  feeding supplement, ENSURE ENLIVE, (ENSURE ENLIVE) LIQD Take 237 mLs by mouth 2 (two) times daily between meals. Patient not taking: Reported on 05/17/2018 11/24/17   Rhetta Mura, MD  hydrocerin (EUCERIN) CREA Apply 1 application topically 2 (two) times daily. Patient not taking: Reported on 05/17/2018 11/24/17   Rhetta Mura, MD    Physical Exam: Vitals:   05/17/18 1430 05/17/18 1445 05/17/18 1530 05/17/18 1625  BP: 121/87 (!) 135/95 121/80   Pulse: (!) 117 (!) 115 (!) 119   Resp: 16 (!) 23 (!) 22   Temp:    98.3 F (36.8 C)  TempSrc:    Rectal  SpO2: 100% 100% 100%   Weight:      Height:         General:  Comatose, GCS 6 - withdraws from pain (sometimes) Eyes:  PERRL, EOMI, normal lids, iris ENT: very dry mucosa Neck:  no LAD, masses or thyromegaly; no carotid bruits Cardiovascular:  Tachycardia, no m/r/g. No LE edema.  Respiratory:   CTA bilaterally with no wheezes/rales/rhonchi.  Normal  respiratory effort. Abdomen:  soft, NT, ND, NABS, cachectic Skin:  no rash or induration seen on limited exam Musculoskeletal:  Diminished but appropriate tone BUE/BLE, good passive ROM, no bony abnormality Lower extremity: No LE edema.  2+ distal pulses. Psychiatric: does not open eyes, does not attempt to verbalize, occasionally moves arms or legs in response to pain Neurologic: unable to perform    Radiological Exams on Admission: Ct Head Wo Contrast  Result Date: 05/17/2018 CLINICAL DATA:  Unresponsive for 2 days. Unable to obtain a blood pressure. Right-sided facial droop. EXAM: CT HEAD WITHOUT CONTRAST TECHNIQUE: Contiguous axial images were obtained from the base of the skull through the vertex without intravenous contrast. COMPARISON:  02/20/2018 FINDINGS: Brain: Diffuse cerebral atrophy. Ventricular dilatation consistent with central atrophy. Cavum septum pellucidum. Low-attenuation changes throughout the deep white matter consistent with small vessel ischemic changes. No mass-effect or midline shift. No abnormal extra-axial fluid collections. Gray-white matter junctions are distinct. Basal cisterns are not effaced. No acute intracranial hemorrhage. Vascular: Mild intracranial arterial calcifications are present. Skull: Calvarium appears intact. Sinuses/Orbits: Paranasal sinuses and mastoid air cells are clear. Other: None. IMPRESSION: No acute intracranial abnormalities. Chronic atrophy and small vessel ischemic changes. Electronically Signed   By: Burman Nieves M.D.   On: 05/17/2018 06:09   US Renal  Result Date: 05/17/2018 CLINICAL DATA:  Acute renal failure. EXAM: RENAL / URINARY TRACT ULTRASOUND COMPLETE COMPARISON:  Renal ultrasound dated 11/16/2017 and CT scan of the abdomen dated 01/22/2015 FINDINGS: Right Kidney: Length: 10.1 cm. 5 mm stone in the mid right kidney, unchanged since the prior CT scan. No hydronephrosis. Echogenicity is normal. No masses. Left Kidney: Length: 10.3  cm. Echogenicity is normal. No hydronephrosis or mass. Bladder: Appears normal for degree of bladder distention.Prostate gland appears prominent. IMPRESSION: 1. No acute abnormality. 2. Chronic small stone in the mid right kidney. Electronically  Signed   By: Francene Boyers M.D.   On: 05/17/2018 14:39   Dg Chest Port 1 View  Result Date: 05/17/2018 CLINICAL DATA:  Initial evaluation for acute unresponsiveness. EXAM: PORTABLE CHEST 1 VIEW COMPARISON:  Prior radiograph from 02/20/2018. FINDINGS: Cardiac and mediastinal silhouettes are stable in size and contour, and remain within normal limits. Aortic atherosclerosis. Lungs hypoinflated. No focal infiltrate, pulmonary edema or pleural effusion. No pneumothorax. No acute osseus abnormality. IMPRESSION: 1. No active cardiopulmonary disease. 2. Aortic atherosclerosis. Electronically Signed   By: Rise Mu M.D.   On: 05/17/2018 05:43    EKG: Independently reviewed.  Sinus tachycardia with rate 117; RBBB; nonspecific ST changes with no evidence of acute ischemia   Labs on Admission: I have personally reviewed the available labs and imaging studies at the time of the admission.  Pertinent labs:   Na++ 168 -> 169 K+ 6.1 -> 4.8 CO2 13 -> 11 -> 16 Glucose 435 -> 257 BUN 166/Creatinine 6.5/GFR 9 -> creatinine 5.78 Anion gap 26 AP 137 Albumin 2.7 Bili 2.3 WBC 4.9 Hgb 6.8, repeat 12.9 x 2 Platelets 64 -> 122 Lactate 3.75 -> 2.9 UDS negative UA: Small Hgb, 5 ketones, moderate LE, 50 glucose, rare bacteria ETOH negative ABG: 7.289/19/168/9.1  Assessment/Plan Principal Problem:   Severe dehydration Active Problems:   Essential hypertension   DM (diabetes mellitus), type 2 with renal complications (HCC)   Dementia   Acute encephalopathy   Elevated lactic acid level   Acute renal failure superimposed on stage 2 chronic kidney disease (HCC)   -Patient presenting from SNF where he was apparently not receiving routine food/water. -He  is severely dehydrated with resultant renal failure -Marked hypernatremia is thought to be related to dehydration and is expected to begin to improve with ongoing hydration. -Hyperkalemia is, as well, and has since resolved. -Anion gap is from dehydration rather than DKA. -Hyperglycemia is improving with IVF alone, and he has also been given 10 units IV insulin plus SSI.  He does not appear to need an insulin drip at this time. -ABG shows acidosis which is thought to be metabolic in nature with respiratory compensation. -Nephrology has consulted on the patient and recommended D5W or 1/2 NS with 2 amps bicarb at 150 cc/hr. -Will follow with q4h BMP - but thus far he has shown gradual improvement by labs. -Will trend lactate, but this is also improving with IVF.  Low suspicion for sepsis at this time.  Instead, likely the result of severe dehydration with use of metformin. -He was given 1 dose of Rocephin in the ER, but he currently does not have an obvious indication for ongoing antibiotics.  Blood cultures are pending. -He has baseline dementia and yet is now comatose which is clearly different from his usual baseline. -Will hold all PO medications at this time, as the patient is incapable of taking PO. -He has malnutrition which is likely secondary at this time -I spoke at length with his niece.  She does not feel capable of making code status decisions for him and would like to try to get in touch with his sister.  They may benefit from a palliative care team meeting once his acute situation has stabilized. -Initial concern for GI bleed/anemia, but repeat Hgb appears to negate this concern.  Will not continue to trend this at this time.  Transfusion cancelled. -Family is requesting SW consult for him to be moved to an alternate facility. -While he is quite ill, PCCM was consulted  and does not think this patient requires ICU admission.  This appears to be reasonable at this time; will admit to  SDU. -Swallow evaluation tomorrow AM or when able.   DVT prophylaxis: Lovenox  Code Status: Full - confirmed with family - until they can discuss further Family Communication: I spoke with the niece at length by telephone Disposition Plan:  To be determined Consults called: Nephrology, SW; PCCM called by the ER Admission status: Admit - It is my clinical opinion that admission to INPATIENT is reasonable and necessary because of the expectation that this patient will require hospital care that crosses at least 2 midnights to treat this condition based on the medical complexity of the problems presented.  Given the aforementioned information, the predictability of an adverse outcome is felt to be significant.  Total critical care time: 65 minutes Critical care time was exclusive of separately billable procedures and treating other patients. Critical care was necessary to treat or prevent imminent or life-threatening deterioration. Critical care was time spent personally by me on the following activities: development of treatment plan with patient and/or surrogate as well as nursing, discussions with consultants, evaluation of patient's response to treatment, examination of patient, obtaining history from patient or surrogate, ordering and performing treatments and interventions, ordering and review of laboratory studies, ordering and review of radiographic studies, pulse oximetry and re-evaluation of patient's condition.    Jonah BlueJennifer Zyad Boomer MD [facility]  If note is complete, please contact covering daytime or nighttime physician. [email or web address] Password [email or web address]  05/17/2018, 5:01 PM

## 2018-05-17 NOTE — ED Notes (Signed)
Attempted to call Family to notify them. No answer from both phone numbers.

## 2018-05-18 DIAGNOSIS — Z515 Encounter for palliative care: Secondary | ICD-10-CM

## 2018-05-18 DIAGNOSIS — Z7189 Other specified counseling: Secondary | ICD-10-CM

## 2018-05-18 LAB — COMPREHENSIVE METABOLIC PANEL
ALT: 29 U/L (ref 0–44)
AST: 20 U/L (ref 15–41)
Albumin: 2.5 g/dL — ABNORMAL LOW (ref 3.5–5.0)
Alkaline Phosphatase: 112 U/L (ref 38–126)
Anion gap: 21 — ABNORMAL HIGH (ref 5–15)
BILIRUBIN TOTAL: 1.5 mg/dL — AB (ref 0.3–1.2)
BUN: 149 mg/dL — AB (ref 8–23)
CALCIUM: 7.6 mg/dL — AB (ref 8.9–10.3)
CO2: 19 mmol/L — ABNORMAL LOW (ref 22–32)
CREATININE: 5.05 mg/dL — AB (ref 0.61–1.24)
Chloride: 120 mmol/L — ABNORMAL HIGH (ref 98–111)
GFR, EST AFRICAN AMERICAN: 12 mL/min — AB (ref >60)
GFR, EST NON AFRICAN AMERICAN: 10 mL/min — AB (ref >60)
Glucose, Bld: 202 mg/dL — ABNORMAL HIGH (ref 70–99)
POTASSIUM: 4.5 mmol/L (ref 3.5–5.1)
Sodium: 160 mmol/L — ABNORMAL HIGH (ref 135–145)
TOTAL PROTEIN: 5.1 g/dL — AB (ref 6.5–8.1)

## 2018-05-18 LAB — URINE CULTURE: Culture: NO GROWTH

## 2018-05-18 LAB — GLUCOSE, CAPILLARY
GLUCOSE-CAPILLARY: 117 mg/dL — AB (ref 70–99)
GLUCOSE-CAPILLARY: 129 mg/dL — AB (ref 70–99)
GLUCOSE-CAPILLARY: 131 mg/dL — AB (ref 70–99)
GLUCOSE-CAPILLARY: 135 mg/dL — AB (ref 70–99)
GLUCOSE-CAPILLARY: 141 mg/dL — AB (ref 70–99)
GLUCOSE-CAPILLARY: 185 mg/dL — AB (ref 70–99)
GLUCOSE-CAPILLARY: 192 mg/dL — AB (ref 70–99)
GLUCOSE-CAPILLARY: 204 mg/dL — AB (ref 70–99)
GLUCOSE-CAPILLARY: 227 mg/dL — AB (ref 70–99)
Glucose-Capillary: 192 mg/dL — ABNORMAL HIGH (ref 70–99)
Glucose-Capillary: 194 mg/dL — ABNORMAL HIGH (ref 70–99)
Glucose-Capillary: 227 mg/dL — ABNORMAL HIGH (ref 70–99)
Glucose-Capillary: 155 mg/dL — ABNORMAL HIGH (ref 70–99)
Glucose-Capillary: 172 mg/dL — ABNORMAL HIGH (ref 70–99)

## 2018-05-18 LAB — CBC
HEMATOCRIT: 37.8 % — AB (ref 39.0–52.0)
HEMOGLOBIN: 11.6 g/dL — AB (ref 13.0–17.0)
MCH: 30.1 pg (ref 26.0–34.0)
MCHC: 30.7 g/dL (ref 30.0–36.0)
MCV: 98.2 fL (ref 78.0–100.0)
Platelets: UNDETERMINED 10*3/uL (ref 150–400)
RBC: 3.85 MIL/uL — AB (ref 4.22–5.81)
RDW: 14.2 % (ref 11.5–15.5)
WBC: 7.7 10*3/uL (ref 4.0–10.5)

## 2018-05-18 LAB — BASIC METABOLIC PANEL
ANION GAP: 17 — AB (ref 5–15)
Anion gap: 24 — ABNORMAL HIGH (ref 5–15)
BUN: 155 mg/dL — ABNORMAL HIGH (ref 8–23)
BUN: 135 mg/dL — ABNORMAL HIGH (ref 8–23)
CHLORIDE: 126 mmol/L — AB (ref 98–111)
CHLORIDE: 114 mmol/L — AB (ref 98–111)
CO2: 15 mmol/L — ABNORMAL LOW (ref 22–32)
CO2: 26 mmol/L (ref 22–32)
CREATININE: 5.57 mg/dL — AB (ref 0.61–1.24)
Calcium: 8 mg/dL — ABNORMAL LOW (ref 8.9–10.3)
Calcium: 7.3 mg/dL — ABNORMAL LOW (ref 8.9–10.3)
Creatinine, Ser: 4.43 mg/dL — ABNORMAL HIGH (ref 0.61–1.24)
GFR calc non Af Amer: 9 mL/min — ABNORMAL LOW (ref >60)
GFR calc non Af Amer: 12 mL/min — ABNORMAL LOW (ref >60)
GFR, EST AFRICAN AMERICAN: 11 mL/min — AB (ref >60)
GFR, EST AFRICAN AMERICAN: 14 mL/min — AB (ref >60)
GLUCOSE: 136 mg/dL — AB (ref 70–99)
Glucose, Bld: 293 mg/dL — ABNORMAL HIGH (ref 70–99)
POTASSIUM: 4.6 mmol/L (ref 3.5–5.1)
POTASSIUM: 4.1 mmol/L (ref 3.5–5.1)
SODIUM: 165 mmol/L — AB (ref 135–145)
Sodium: 157 mmol/L — ABNORMAL HIGH (ref 135–145)

## 2018-05-18 LAB — HEMOGLOBIN A1C
HEMOGLOBIN A1C: 7.5 % — AB (ref 4.8–5.6)
Mean Plasma Glucose: 169 mg/dL

## 2018-05-18 LAB — BETA-HYDROXYBUTYRIC ACID: Beta-Hydroxybutyric Acid: 5.73 mmol/L — ABNORMAL HIGH (ref 0.05–0.27)

## 2018-05-18 MED ORDER — SODIUM CHLORIDE 0.9 % IV SOLN
INTRAVENOUS | Status: DC
  Administered 2018-05-18: 1.7 [IU]/h via INTRAVENOUS
  Administered 2018-05-19: 0.5 [IU]/h via INTRAVENOUS
  Filled 2018-05-18: qty 1

## 2018-05-18 MED ORDER — SODIUM BICARBONATE 8.4 % IV SOLN
INTRAVENOUS | Status: DC
  Administered 2018-05-18 – 2018-05-19 (×3): via INTRAVENOUS
  Filled 2018-05-18 (×4): qty 100

## 2018-05-18 MED ORDER — METOPROLOL TARTRATE 5 MG/5ML IV SOLN
2.5000 mg | Freq: Once | INTRAVENOUS | Status: AC
  Administered 2018-05-19: 2.5 mg via INTRAVENOUS
  Filled 2018-05-18: qty 5

## 2018-05-18 MED ORDER — SODIUM CHLORIDE 0.9 % IV SOLN: INTRAVENOUS | Status: DC

## 2018-05-18 MED ORDER — DEXTROSE-NACL 5-0.45 % IV SOLN: INTRAVENOUS | Status: DC

## 2018-05-18 MED ORDER — INSULIN ASPART 100 UNIT/ML ~~LOC~~ SOLN: 0.0000 [IU] | Freq: Four times a day (QID) | SUBCUTANEOUS | Status: DC

## 2018-05-18 MED ORDER — HEPARIN SODIUM (PORCINE) 5000 UNIT/ML IJ SOLN
5000.0000 [IU] | Freq: Three times a day (TID) | INTRAMUSCULAR | Status: DC
  Administered 2018-05-18 – 2018-05-23 (×16): 5000 [IU] via SUBCUTANEOUS
  Filled 2018-05-18 (×15): qty 1

## 2018-05-18 MED ORDER — CHLORHEXIDINE GLUCONATE 0.12 % MT SOLN
15.0000 mL | Freq: Two times a day (BID) | OROMUCOSAL | Status: DC
  Administered 2018-05-19 – 2018-05-23 (×9): 15 mL via OROMUCOSAL
  Filled 2018-05-18 (×9): qty 15

## 2018-05-18 MED ORDER — SODIUM CHLORIDE 0.45 % IV SOLN: INTRAVENOUS | Status: DC

## 2018-05-18 MED ORDER — MAGNESIUM SULFATE 2 GM/50ML IV SOLN
2.0000 g | Freq: Once | INTRAVENOUS | Status: AC
  Administered 2018-05-18: 2 g via INTRAVENOUS
  Filled 2018-05-18: qty 50

## 2018-05-18 MED ORDER — ORAL CARE MOUTH RINSE
15.0000 mL | Freq: Two times a day (BID) | OROMUCOSAL | Status: DC
  Administered 2018-05-19 – 2018-05-23 (×10): 15 mL via OROMUCOSAL

## 2018-05-18 NOTE — Progress Notes (Signed)
PT Cancellation Note  Patient Details Name: Richard Powers MRN: [id number] DOB: 1946/07/15   Cancelled Treatment:    Reason Eval/Treat Not Completed: Active bedrest order Pt with strict bedrest orders. Will follow up as activity orders updated and as schedule allows.   Richard Powers, PT, DPT  Acute Rehabilitation Services  Pager: [phone] Office: [phone]  Richard Powers 05/18/2018, 2:43 PM

## 2018-05-18 NOTE — Progress Notes (Addendum)
Initial Nutrition Assessment  DOCUMENTATION CODES:   Non-severe (moderate) malnutrition in context of chronic illness, Underweight  INTERVENTION:  ONS upon diet advancement    NUTRITION DIAGNOSIS:   Moderate Malnutrition related to chronic illness, inability to eat(altered mental status, dementia, and CKD) as evidenced by NPO status, moderate fat depletion, moderate muscle depletion.   GOAL:   Patient will meet greater than or equal to 90% of their needs   MONITOR:   Diet advancement  REASON FOR ASSESSMENT:   Malnutrition Screening Tool, Other (Comment)(low BMI)    ASSESSMENT:   Pt admitted for progressing altered mental status x 4days. Per MD note pt has severe hypernatremia, hyperkalemia, AKI/CKD, and metabolic acidosis.   Pt medical history of CKD, depression T2DM w/out complications, HLD, HTN, dementia. SNF resident and recent fall 9/9 at facility per physician note.   Seen by SLP this morning and reports pt has limited arousal for PO intake despite multimodal cues. SLP recs are holding PO's today and SLP f/u once more alert.  Pt was not alert at time of visit and family had not been in per RN. Will continue to monitor for diet advancement or TF recs should aggressive measures be sought.   Medications reviewed and include: novoLog 0-5units at bedtime, 0-9units w/ meals, NaCl infusion  Labs reviewed and include: Glucose 192 (H), Na 160 (H), Cl 120 (H), BUN 149 (H), Cr 5.05 (H),  NUTRITION - FOCUSED PHYSICAL EXAM:    Most Recent Value  Orbital Region  Mild depletion  Upper Arm Region  Moderate depletion  Thoracic and Lumbar Region  Unable to assess  Buccal Region  Mild depletion  Temple Region  Moderate depletion  Clavicle Bone Region  Mild depletion  Clavicle and Acromion Bone Region  Unable to assess  Scapular Bone Region  Unable to assess  Dorsal Hand  Moderate depletion  Patellar Region  Unable to assess  Anterior Thigh Region  Unable to assess  Posterior  Calf Region  Unable to assess  Edema (RD Assessment)  None  Hair  Reviewed  Eyes  Unable to assess  Mouth  Unable to assess  Skin  Reviewed [dry/flaky skin around feet]  Nails  Reviewed       Diet Order:   Diet Order            Diet NPO time specified  Diet effective now              EDUCATION NEEDS:   Not appropriate for education at this time  Skin:  Skin Assessment: Skin Integrity Issues: Skin Integrity Issues:: Stage I, Stage III(per RN note) Stage I: ischial tuberosity Stage III: cocccyx(per RN note)  Last BM:  (none recorded)  Height:   Ht Readings from Last 1 Encounters:  05/18/18 5' 7.99" (1.727 m)    Weight:   Wt Readings from Last 1 Encounters:  05/18/18 45.4 kg    Ideal Body Weight:  70 kg  BMI:  Body mass index is 15.22 kg/m.  Estimated Nutritional Needs:   Kcal:  1400-1600 (30-35kcal/kg)  Protein:  68-85grams (1.5-1.8)  Fluid:  1.6L    Lars MassonSuzanne Zarya Lasseigne, RD, LDN  After Hours/Weekend Pager: [phone]

## 2018-05-18 NOTE — Progress Notes (Signed)
@IPLOG @        PROGRESS NOTE                                                                                                                                                                                                             Patient Demographics:    Richard Powers, is a 72 y.o. male, DOB - [birth year], ZOX:[id number]  Admit date - 05/17/2018   Admitting Physician Jonah Blue, MD  Outpatient Primary MD for the patient is [name], MD  LOS - 1  Chief Complaint  Patient presents with  . Altered Mental Status       Brief Narrative   Richard Powers is a 72 y.o. male with medical history significant of HTN; HLD; DM; CKD; and dementia presenting with AMS started apparently 2 days ago at SNF, in the ER he was found to be severely dehydrated, and acute renal failure was obtunded, head CT chest x-ray and renal ultrasound were nonacute and he was admitted for further treatment.   Subjective:    [name] today is in bed, unable to answer questions or follow commands.   Assessment  & Plan :     1.  Severe dehydration with hyperkalemia, hyponatremia, acute renal failure along with wide anion gap metabolic acidosis likely due to combination of DKA along with lactic acidosis caused by metformin.    At this time I will transfer the patient to 2 amp bicarb drip and D5W, IV insulin drip, monitor electrolytes, monitor sodium, gap is improving, discontinue metformin permanently.  Case discussed with nephrologist Dr. Arrie Aran.  2.  Severe metabolic encephalopathy.  Due to #1 above.  CT head nonacute.  Supportive care.  Now n.p.o.  Speech following.  3.  Hypomagnesemia.  Replaced.  4.  Underlying Mild-Moderate dementia.  For now supportive care, at risk for delirium.  Commence home medications once mentation has improved.  Per staff nursing home informed us that patient is fairly active at baseline however unable to provide any history at this time and clearly is  obtunded.  5.  Hypertension.  Currently n.p.o. due to encephalopathy.  PRN IV hydralazine.    Family Communication  :  None  Code Status :  Full, question baseline quality of care, question long-term prognosis.  Will involve palliative care as well.  Disposition Plan  :  Tele  Consults  :  Renal  Procedures  :    CT head, renal ultrasound and chest x-ray.  All nonacute.  DVT Prophylaxis  :  Heparin   Lab Results  Component Value Date   PLT PLATELET CLUMPS NOTED ON SMEAR, UNABLE TO ESTIMATE 05/18/2018    Diet :  Diet Order            Diet NPO time specified  Diet effective now               Inpatient Medications Scheduled Meds: . insulin aspart  0-9 Units Subcutaneous Q6H  . sodium chloride flush  3 mL Intravenous Q12H   Continuous Infusions: . sodium chloride    .  sodium bicarbonate (isotonic) infusion in sterile water 200 mL/hr at 05/18/18 0350   PRN Meds:.[DISCONTINUED] acetaminophen **OR** acetaminophen, bisacodyl, ondansetron **OR** ondansetron (ZOFRAN) IV, polyvinyl alcohol, sodium phosphate  Antibiotics  :   Anti-infectives (From admission, onward)   Start     Dose/Rate Route Frequency Ordered Stop   05/17/18 0715  cefTRIAXone (ROCEPHIN) 1 g in sodium chloride 0.9 % 100 mL IVPB     1 g 200 mL/hr over 30 Minutes Intravenous  Once 05/17/18 0703 05/17/18 0836          Objective:   Vitals:   05/18/18 0130 05/18/18 0338 05/18/18 0530 05/18/18 0745  BP: 111/76 132/84 133/81   Pulse:  (!) 117    Resp: 11 14 14    Temp:    97.6 F (36.4 C)  TempSrc:    Axillary  SpO2:  100%    Weight:    45.4 kg  Height:    5' 7.99" (1.727 m)    Wt Readings from Last 3 Encounters:  05/18/18 45.4 kg  02/21/18 65.9 kg  11/24/17 75.9 kg     Intake/Output Summary (Last 24 hours) at 05/18/2018 1109 Last data filed at 05/18/2018 0558 Gross per 24 hour  Intake 3053.05 ml  Output 500 ml  Net 2553.05 ml     Physical Exam  Somnolent, unable to follow  commands, does withdraw to painful stimuli in all 4 extremities with [place].AT,PERRAL Supple Neck,No JVD, No cervical lymphadenopathy appriciated.  Symmetrical Chest wall movement, Good air movement bilaterally, CTAB RRR,No Gallops,Rubs or new Murmurs, No Parasternal Heave +ve B.Sounds, Abd Soft, No tenderness, No organomegaly appriciated, No rebound - guarding or rigidity. No Cyanosis, Clubbing or edema, No new Rash or bruise       Data Review:    CBC Recent Labs  Lab 05/17/18 0525 05/17/18 0807 05/17/18 1503 05/18/18 0938  WBC 4.9 10.2 5.8 7.7  HGB 6.8* 12.9* 12.9* 11.6*  HCT 24.8* 43.4 44.3 37.8*  PLT 64* 149* 122* PLATELET CLUMPS NOTED ON SMEAR, UNABLE TO ESTIMATE  MCV 114.3* 104.8* 105.0* 98.2  MCH 31.3 31.2 30.6 30.1  MCHC 27.4* 29.7* 29.1* 30.7  RDW 14.3 14.3 14.2 14.2  LYMPHSABS 0.3*  --   --   --   MONOABS 0.2  --   --   --   EOSABS 0.0  --   --   --   BASOSABS 0.0  --   --   --     Chemistries  Recent Labs  Lab 05/17/18 0525  05/17/18 0807 05/17/18 1127 05/17/18 1503 05/17/18 2019 05/18/18 0011 05/18/18 0643  NA  --    < > 168* 169* 169* 164* 165* 160*  K  --    < > 6.1* 4.9 4.8 4.5 4.6 4.5  CL  --    < > 129* >130* >130* 124* 126* 120*  CO2  --    < > 13* 11* 16* 16* 15*  19*  GLUCOSE  --    < > 435* 318* 257* 296* 293* 202*  BUN  --    < > 166* 156* 158* 155* 155* 149*  CREATININE  --    < > 6.50* 6.05* 5.78* 5.44* 5.57* 5.05*  CALCIUM  --    < > 8.2* 8.2* 8.2* 8.1* 8.0* 7.6*  MG 1.2*  --   --   --   --   --   --   --   AST  --   --  20  --   --   --   --  20  ALT  --   --  38  --   --   --   --  29  ALKPHOS  --   --  137*  --   --   --   --  112  BILITOT  --   --  2.3*  --   --   --   --  1.5*   < > = values in this interval not displayed.   ------------------------------------------------------------------------------------------------------------------ No results for input(s): CHOL, HDL, LDLCALC, TRIG, CHOLHDL, LDLDIRECT in the last 72  hours.  Lab Results  Component Value Date   HGBA1C 7.5 (H) 05/17/2018   ------------------------------------------------------------------------------------------------------------------ No results for input(s): TSH, T4TOTAL, T3FREE, THYROIDAB in the last 72 hours.  Invalid input(s): FREET3 ------------------------------------------------------------------------------------------------------------------ No results for input(s): VITAMINB12, FOLATE, FERRITIN, TIBC, IRON, RETICCTPCT in the last 72 hours.  Coagulation profile No results for input(s): INR, PROTIME in the last 168 hours.  No results for input(s): DDIMER in the last 72 hours.  Cardiac Enzymes No results for input(s): CKMB, TROPONINI, MYOGLOBIN in the last 168 hours.  Invalid input(s): CK ------------------------------------------------------------------------------------------------------------------ No results found for: BNP  Micro Results Recent Results (from the past 240 hour(s))  Urine culture     Status: None   Collection Time: 05/17/18  5:36 AM  Result Value Ref Range Status   Specimen Description URINE, RANDOM  Final   Special Requests NONE  Final   Culture   Final    NO GROWTH Performed at [facility], [address] [place], [place] [address]    Report Status 05/18/2018 FINAL  Final  Culture, blood (Routine X 2) w Reflex to ID Panel     Status: None (Preliminary result)   Collection Time: 05/17/18  9:40 AM  Result Value Ref Range Status   Specimen Description BLOOD RIGHT WRIST  Final   Special Requests   Final    BOTTLES DRAWN AEROBIC ONLY Blood Culture results may not be optimal due to an inadequate volume of blood received in culture bottles   Culture   Final    NO GROWTH < 24 HOURS Performed at [facility], [address], [place], [place] [address]    Report Status PENDING  Incomplete  Culture, blood (Routine X 2) w Reflex to ID Panel     Status: None (Preliminary result)    Collection Time: 05/17/18  9:45 AM  Result Value Ref Range Status   Specimen Description BLOOD RIGHT HAND  Final   Special Requests   Final    BOTTLES DRAWN AEROBIC AND ANAEROBIC Blood Culture results may not be optimal due to an inadequate volume of blood received in culture bottles   Culture   Final    NO GROWTH < 24 HOURS Performed at [facility], [address], [place], [place] [address]    Report Status  PENDING  Incomplete  MRSA PCR Screening     Status: None   Collection Time: 05/17/18  4:24 PM  Result Value Ref Range Status   MRSA by PCR NEGATIVE NEGATIVE Final    Comment:        The GeneXpert MRSA Assay (FDA approved for NASAL specimens only), is one component of a comprehensive MRSA colonization surveillance program. It is not intended to diagnose MRSA infection nor to guide or monitor treatment for MRSA infections. Performed at [facility], [address], [place], [place] [address]     Radiology Reports Ct Head Wo Contrast  Result Date: 05/17/2018 CLINICAL DATA:  Unresponsive for 2 days. Unable to obtain a blood pressure. Right-sided facial droop. EXAM: CT HEAD WITHOUT CONTRAST TECHNIQUE: Contiguous axial images were obtained from the base of the skull through the vertex without intravenous contrast. COMPARISON:  02/20/2018 FINDINGS: Brain: Diffuse cerebral atrophy. Ventricular dilatation consistent with central atrophy. Cavum septum pellucidum. Low-attenuation changes throughout the deep white matter consistent with small vessel ischemic changes. No mass-effect or midline shift. No abnormal extra-axial fluid collections. Gray-white matter junctions are distinct. Basal cisterns are not effaced. No acute intracranial hemorrhage. Vascular: Mild intracranial arterial calcifications are present. Skull: Calvarium appears intact. Sinuses/Orbits: Paranasal sinuses and mastoid air cells are clear. Other: None. IMPRESSION: No acute intracranial abnormalities. Chronic  atrophy and small vessel ischemic changes. Electronically Signed   By: Burman Nieves M.D.   On: 05/17/2018 06:09   US Renal  Result Date: 05/17/2018 CLINICAL DATA:  Acute renal failure. EXAM: RENAL / URINARY TRACT ULTRASOUND COMPLETE COMPARISON:  Renal ultrasound dated 11/16/2017 and CT scan of the abdomen dated 01/22/2015 FINDINGS: Right Kidney: Length: 10.1 cm. 5 mm stone in the mid right kidney, unchanged since the prior CT scan. No hydronephrosis. Echogenicity is normal. No masses. Left Kidney: Length: 10.3 cm. Echogenicity is normal. No hydronephrosis or mass. Bladder: Appears normal for degree of bladder distention.Prostate gland appears prominent. IMPRESSION: 1. No acute abnormality. 2. Chronic small stone in the mid right kidney. Electronically Signed   By: Francene Boyers M.D.   On: 05/17/2018 14:39   Dg Chest Port 1 View  Result Date: 05/17/2018 CLINICAL DATA:  Initial evaluation for acute unresponsiveness. EXAM: PORTABLE CHEST 1 VIEW COMPARISON:  Prior radiograph from 02/20/2018. FINDINGS: Cardiac and mediastinal silhouettes are stable in size and contour, and remain within normal limits. Aortic atherosclerosis. Lungs hypoinflated. No focal infiltrate, pulmonary edema or pleural effusion. No pneumothorax. No acute osseus abnormality. IMPRESSION: 1. No active cardiopulmonary disease. 2. Aortic atherosclerosis. Electronically Signed   By: Rise Mu M.D.   On: 05/17/2018 05:43    Time Spent in minutes  30   Susa Raring M.D on 05/18/2018 at 11:09 AM  To page go to [email or web address] - password [facility]

## 2018-05-18 NOTE — Progress Notes (Signed)
CRITICAL VALUE ALERT  Critical Value:  Sodium 165   Date & Time Notied:  05/18/18  0103  Provider Notified: Donnamarie PoagK.  Kirby   Orders Received/Actions taken: No new orders received

## 2018-05-18 NOTE — Progress Notes (Signed)
S: No events overnight and remains somnolent and unresponsive O:BP 133/81   Pulse (!) 117   Temp 97.6 F (36.4 C) (Axillary)   Resp 14   Ht 5' 7.99" (1.727 m)   Wt 45.4 kg   SpO2 100%   BMI 15.22 kg/m   Intake/Output Summary (Last 24 hours) at 05/18/2018 1256 Last data filed at 05/18/2018 0558 Gross per 24 hour  Intake 3053.05 ml  Output 500 ml  Net 2553.05 ml   Intake/Output: I/O last 3 completed shifts: In: 5813.1 [I.V.:4053.1; IV Piggyback:1760] Out: 500 [Urine:500]  Intake/Output this shift:  No intake/output data recorded. Weight change:  Gen: elderly AAM, unresponsive CVS: tachy Resp: cta Abd: +BS, soft, NT/ND Ext: no edema  Recent Labs  Lab 05/17/18 0807 05/17/18 1127 05/17/18 1503 05/17/18 2019 05/18/18 0011 05/18/18 0643  NA 168* 169* 169* 164* 165* 160*  K 6.1* 4.9 4.8 4.5 4.6 4.5  CL 129* >130* >130* 124* 126* 120*  CO2 13* 11* 16* 16* 15* 19*  GLUCOSE 435* 318* 257* 296* 293* 202*  BUN 166* 156* 158* 155* 155* 149*  CREATININE 6.50* 6.05* 5.78* 5.44* 5.57* 5.05*  ALBUMIN 2.7*  --   --   --   --  2.5*  CALCIUM 8.2* 8.2* 8.2* 8.1* 8.0* 7.6*  AST 20  --   --   --   --  20  ALT 38  --   --   --   --  29   Liver Function Tests: Recent Labs  Lab 05/17/18 0807 05/18/18 0643  AST 20 20  ALT 38 29  ALKPHOS 137* 112  BILITOT 2.3* 1.5*  PROT 5.4* 5.1*  ALBUMIN 2.7* 2.5*   No results for input(s): LIPASE, AMYLASE in the last 168 hours. Recent Labs  Lab 05/17/18 0524  AMMONIA 44*   CBC: Recent Labs  Lab 05/17/18 0525 05/17/18 0807 05/17/18 1503 05/18/18 0938  WBC 4.9 10.2 5.8 7.7  NEUTROABS 4.4  --   --   --   HGB 6.8* 12.9* 12.9* 11.6*  HCT 24.8* 43.4 44.3 37.8*  MCV 114.3* 104.8* 105.0* 98.2  PLT 64* 149* 122* PLATELET CLUMPS NOTED ON SMEAR, UNABLE TO ESTIMATE   Cardiac Enzymes: Recent Labs  Lab 05/17/18 1503  CKTOTAL 68   CBG: Recent Labs  Lab 05/17/18 2219 05/17/18 2343 05/18/18 0332 05/18/18 0742 05/18/18 1207  GLUCAP  265* 245* 192* 192* 227*    Iron Studies: No results for input(s): IRON, TIBC, TRANSFERRIN, FERRITIN in the last 72 hours. Studies/Results: Ct Head Wo Contrast  Result Date: 05/17/2018 CLINICAL DATA:  Unresponsive for 2 days. Unable to obtain a blood pressure. Right-sided facial droop. EXAM: CT HEAD WITHOUT CONTRAST TECHNIQUE: Contiguous axial images were obtained from the base of the skull through the vertex without intravenous contrast. COMPARISON:  02/20/2018 FINDINGS: Brain: Diffuse cerebral atrophy. Ventricular dilatation consistent with central atrophy. Cavum septum pellucidum. Low-attenuation changes throughout the deep white matter consistent with small vessel ischemic changes. No mass-effect or midline shift. No abnormal extra-axial fluid collections. Gray-white matter junctions are distinct. Basal cisterns are not effaced. No acute intracranial hemorrhage. Vascular: Mild intracranial arterial calcifications are present. Skull: Calvarium appears intact. Sinuses/Orbits: Paranasal sinuses and mastoid air cells are clear. Other: None. IMPRESSION: No acute intracranial abnormalities. Chronic atrophy and small vessel ischemic changes. Electronically Signed   By: Burman Nieves M.D.   On: 05/17/2018 06:09   US Renal  Result Date: 05/17/2018 CLINICAL DATA:  Acute renal failure. EXAM: RENAL / URINARY  TRACT ULTRASOUND COMPLETE COMPARISON:  Renal ultrasound dated 11/16/2017 and CT scan of the abdomen dated 01/22/2015 FINDINGS: Right Kidney: Length: 10.1 cm. 5 mm stone in the mid right kidney, unchanged since the prior CT scan. No hydronephrosis. Echogenicity is normal. No masses. Left Kidney: Length: 10.3 cm. Echogenicity is normal. No hydronephrosis or mass. Bladder: Appears normal for degree of bladder distention.Prostate gland appears prominent. IMPRESSION: 1. No acute abnormality. 2. Chronic small stone in the mid right kidney. Electronically Signed   By: Francene Boyers M.D.   On: 05/17/2018 14:39    Dg Chest Port 1 View  Result Date: 05/17/2018 CLINICAL DATA:  Initial evaluation for acute unresponsiveness. EXAM: PORTABLE CHEST 1 VIEW COMPARISON:  Prior radiograph from 02/20/2018. FINDINGS: Cardiac and mediastinal silhouettes are stable in size and contour, and remain within normal limits. Aortic atherosclerosis. Lungs hypoinflated. No focal infiltrate, pulmonary edema or pleural effusion. No pneumothorax. No acute osseus abnormality. IMPRESSION: 1. No active cardiopulmonary disease. 2. Aortic atherosclerosis. Electronically Signed   By: Rise Mu M.D.   On: 05/17/2018 05:43   . heparin injection (subcutaneous)  5,000 Units Subcutaneous Q8H  . sodium chloride flush  3 mL Intravenous Q12H    BMET    Component Value Date/Time   NA 160 (H) 05/18/2018 0643   K 4.5 05/18/2018 0643   CL 120 (H) 05/18/2018 0643   CO2 19 (L) 05/18/2018 0643   GLUCOSE 202 (H) 05/18/2018 0643   BUN 149 (H) 05/18/2018 0643   CREATININE 5.05 (H) 05/18/2018 0643   CALCIUM 7.6 (L) 05/18/2018 0643   GFRNONAA 10 (L) 05/18/2018 0643   GFRAA 12 (L) 05/18/2018 0643   CBC    Component Value Date/Time   WBC 7.7 05/18/2018 0938   RBC 3.85 (L) 05/18/2018 0938   HGB 11.6 (L) 05/18/2018 0938   HCT 37.8 (L) 05/18/2018 0938   PLT PLATELET CLUMPS NOTED ON SMEAR, UNABLE TO ESTIMATE 05/18/2018 0938   MCV 98.2 05/18/2018 0938   MCH 30.1 05/18/2018 0938   MCHC 30.7 05/18/2018 0938   RDW 14.2 05/18/2018 0938   LYMPHSABS 0.3 (L) 05/17/2018 0525   MONOABS 0.2 05/17/2018 0525   EOSABS 0.0 05/17/2018 0525   BASOSABS 0.0 05/17/2018 0525     Assessment/Plan: 1.  AKI/CKD stage 3- appears to be in the setting of severe volume depletion and dehydration.   1. Improving with IVF"s  2. renal US without hydro but has chronic right nephrolithiasis 3. No indication for HD at this time. 2. Metabolic acidosis- due to #1 and metformin.  Stopped metformin and started sterile water with 2 amps of bicarb 1. Ok to change  to D5W with 2 amps of bicarb per Dr. Thedore Mins for possible DKA 3. Lactic acidosis- due to AKI in setting of concomitant metformin.  Would not restart metformin.   1. Treat with bicarb gtt as above. 4. Hypernatremia due to dehydration- improving with hypotonic bicarb at an acceptable rate.  Continue with IVF's and follow. 5. Hyperkalemia- due to #1 and #2, already improving. 6. HTN- stable 7. Moderate protein malnutrition- will need to start protein supplements when able to take po 8. Dementia- per primary svc. 9. DM- hyperglycemic.  Start insulin per primary svc 10. Anemia- possibly due to CKD stage 3 but also need to r/o GIB.  Increased Hgb from baseline of 9-10 likely due to dehydration and concentration effect.  Follow h/h and transfuse prn. 11. FTT- consider palliative care consult to help set goals/limits of care in an elderly,  SNF resident with progressive dementia who is a full code.  Irena CordsJoseph A. Jabre Heo, MD [facility] [phone]

## 2018-05-18 NOTE — Consult Note (Signed)
Consultation Note Date: 05/18/2018   Patient Name: Richard Powers  DOB: 09-11-1945  MRN: [id number]  Age / Sex: 72 y.o., male  PCP: Charlott Rakes, MD Referring Physician: Thurnell Lose, MD  Reason for Consultation: Establishing goals of care  HPI/Patient Profile: 72 y.o. male  with past medical history of HTN, HLD, CKD, depression, DM, and dementia admitted on 05/17/2018 with altered mental status. Patient resides in SNF. He was found to be severely dehydrated with acute renal failure. Labs revealed hyperkalemia, hyponatremia, and hypomagnesemia. Head CT and renal ultrasound negative for acute complications. Patient evaluated by SLP but did not arouse sufficiently for PO intake and deemed a severe aspiration risk. PMT consulted for [place].   Clinical Assessment and Goals of Care: I have reviewed medical records including EPIC notes, labs and imaging, received report from RN, assessed the patient and then met with patient's niece, niece's husband, and patient's cousin  to discuss diagnosis prognosis, GOC, EOL wishes, disposition and options.  Family tells me they are the only family available for the patient. The patient does have a sister but she is not local and has not been available by phone.   I introduced Palliative Medicine as specialized medical care for people living with serious illness. It focuses on providing relief from the symptoms and stress of a serious illness. The goal is to improve quality of life for both the patient and the family.  We discussed a brief life review of the patient. They describe him as someone who was very active and "loved life". They share that he was in the [employer] and has 2 masters degrees.   As far as functional and nutritional status, they tell me of a recent significant decline since the patient suffered a hip fracture in June. The tell me he spends most of his time in bed; however, they think  he walks occasionally with PT. They also share that his appetite has significantly decreased - however, they are unsure if this is d/t lack of appetite or d/t what is offered at facility. They also tell me of cognitive decline - patient not as interactive, minimally verbal. He is dependent in ADLs. He also has experienced multiple falls.   They talk about patient's multiple hospitalizations - 3 in 6 months.    We discussed his current illness and what it means in the larger context of his on-going co-morbidities.  Natural disease trajectory and expectations at EOL were discussed. We discussed hope that patient improves as he is hydrated. We also discussed progressive nature of dementia and how dementia can lead to decreased desire to eat.   I attempted to elicit values and goals of care important to the patient.  They tell me patient would want dignity maintained at this time in his life.   The difference between aggressive medical intervention and comfort care was considered in light of the patient's goals of care. Family would like to continue current medical interventions to correct dehydration; however, they are interested in focusing on comfort once current issues are corrected.   Advance directives, concepts specific to code status, artifical feeding and hydration, and rehospitalization were considered and discussed. We discussed code status at length and family agrees that patient should be DNR given patient's poor baseline status.   Family requests patient be moved to new facility upon discharge.  Hospice and Palliative Care services outpatient were explained and offered. Family is interested in patient receiving hospice care at SNF.  Questions and  concerns were addressed. The family was encouraged to call with questions or concerns.   Primary Decision Maker NEXT OF KIN - family available -  Niece [name]) and cousin Fraser Din)    SUMMARY OF RECOMMENDATIONS   - initial palliative  discussion: education about patient status and advance care planning discussed - code status changed to DNR per family wishes - family focused on patient being discharged to different SNF - discussed with CSW - request hospice care at facility upon discharge once dehydration corrected - PMT will continue to follow and update family   Code Status/Advance Care Planning:  DNR   Symptom Management:   No symptoms, patient currently minimally responsive  Psycho-social/Spiritual:   Desire for further Chaplaincy support:no  Additional Recommendations: Education on Hospice  Prognosis:   Unable to determine - poor prognosis r/t advanced dementia, functional and nutritional decline  Discharge Planning: [place] with Hospice once dehydration corrected     Primary Diagnoses: Present on Admission: . Severe dehydration . Acute encephalopathy . Acute renal failure superimposed on stage 2 chronic kidney disease ([place]) . Dementia . DM (diabetes mellitus), type 2 with renal complications ([place]) . Elevated lactic acid level . Essential hypertension   I have reviewed the medical record, interviewed the patient and family, and examined the patient. The following aspects are pertinent.  Past Medical History:  Diagnosis Date  . CKD (chronic kidney disease)   . Depression   . Diabetes mellitus without complication ([place])   . Encephalopathy   . Hyperlipidemia   . Hypertension    Social History   Socioeconomic History  . Marital status: Single    Spouse name: Not on file  . Number of children: Not on file  . Years of education: Not on file  . Highest education level: Not on file  Occupational History  . Not on file  Social Needs  . Financial resource strain: Not on file  . Food insecurity:    Worry: Not on file    Inability: Not on file  . Transportation needs:    Medical: Not on file    Non-medical: Not on file  Tobacco Use  . Smoking status: Former Research scientist (life sciences)    . Smokeless tobacco: Never Used  Substance and Sexual Activity  . Alcohol use: Yes  . Drug use: No  . Sexual activity: Yes  Lifestyle  . Physical activity:    Days per week: Not on file    Minutes per session: Not on file  . Stress: Not on file  Relationships  . Social connections:    Talks on phone: Not on file    Gets together: Not on file    Attends religious service: Not on file    Active member of club or organization: Not on file    Attends meetings of clubs or organizations: Not on file    Relationship status: Not on file  Other Topics Concern  . Not on file  Social History Narrative  . Not on file   Family History  Family history unknown: Yes   Scheduled Meds: . heparin injection (subcutaneous)  5,000 Units Subcutaneous Q8H  . sodium chloride flush  3 mL Intravenous Q12H   Continuous Infusions: . insulin (NOVOLIN-R) infusion    . magnesium sulfate 1 - 4 g bolus IVPB    .  sodium bicarbonate  infusion 1000 mL     PRN Meds:.[DISCONTINUED] acetaminophen **OR** acetaminophen, bisacodyl, ondansetron **OR** ondansetron (ZOFRAN) IV, polyvinyl alcohol, sodium phosphate No Known Allergies  Review of Systems  Unable to perform ROS: Dementia    Physical Exam  Constitutional: No distress.  HENT:  Head: Normocephalic and atraumatic.  Cardiovascular: Regular rhythm. Tachycardia present.  Pulmonary/Chest: Effort normal and breath sounds normal. No accessory muscle usage. No tachypnea. No respiratory distress.  Abdominal: Soft. Bowel sounds are normal.  Musculoskeletal: He exhibits no edema.  Neurological: He is unresponsive.  Skin: Skin is warm and dry. He is not diaphoretic.    Vital Signs: BP 133/81   Pulse (!) 117   Temp 97.6 F (36.4 C) (Axillary)   Resp 14   Ht 5' 7.99" (1.727 m)   Wt 45.4 kg   SpO2 100%   BMI 15.22 kg/m  Pain Scale: PAINAD       SpO2: SpO2: 100 % O2 Device:SpO2: 100 % O2 Flow Rate: .   IO: Intake/output summary:    Intake/Output Summary (Last 24 hours) at 05/18/2018 1210 Last data filed at 05/18/2018 0558 Gross per 24 hour  Intake 3053.05 ml  Output 500 ml  Net 2553.05 ml    LBM: Last BM Date: (PTA) Baseline Weight: Weight: 45.4 kg Most recent weight: Weight: 45.4 kg     Palliative Assessment/Data: PPS 10%     Time Total: 80 minutes Greater than 50%  of this time was spent counseling and coordinating care related to the above assessment and plan.  Juel Burrow, DNP, AGNP-C Palliative Medicine Team [phone] Pager: [phone]

## 2018-05-18 NOTE — Evaluation (Signed)
Clinical/Bedside Swallow Evaluation Patient Details  Name: Richard Powers MRN: [id number] Date of Birth: [birth year]  Today's Date: 05/18/2018 Time: SLP Start Time (ACUTE ONLY): 0802 SLP Stop Time (ACUTE ONLY): 0814 SLP Time Calculation (min) (ACUTE ONLY): 12 min  Past Medical History:  Past Medical History:  Diagnosis Date  . CKD (chronic kidney disease)   . Depression   . Diabetes mellitus without complication (HCC)   . Encephalopathy   . Hyperlipidemia   . Hypertension    Past Surgical History:  Past Surgical History:  Procedure Laterality Date  . INTRAMEDULLARY (IM) NAIL INTERTROCHANTERIC Left 02/21/2018   Procedure: INTRAMEDULLARY (IM) NAIL INTERTROCHANTRIC;  Surgeon: Yolonda Kidaogers, Jason Patrick, MD;  [facility];  Service: Orthopedics;  Laterality: Left;   HPI:  Pt is a 72 y.o. male with medical history significant for HTN; HLD; DM; CKD; and dementia presenting with AMS. Per MD note, pt with AKI due to severe dehydration. CXR and CT Head negative.   Assessment / Plan / Recommendation Clinical Impression  Pt has altered mentation and does not sufficiently arouse for PO intake despite Max multimodal cues. Per chart review, this is a significant decline from baseline function. Pt's mouth is dry and although he did not open his mouth much, oral care was provided as much as could be done, with dried secretions being pulled from pt's oral cavity. Recommend holding POs for today with additional SLP f/u once more alert. Would focus on thorough oral care, as pt will allow. SLP Visit Diagnosis: Dysphagia, unspecified (R13.10)    Aspiration Risk  Severe aspiration risk    Diet Recommendation NPO   Medication Administration: Via alternative means    Other  Recommendations Oral Care Recommendations: Oral care QID Other Recommendations: Have oral suction available   Follow up Recommendations Skilled Nursing facility      Frequency and Duration min 2x/week  2 weeks        Prognosis Prognosis for Safe Diet Advancement: Good Barriers to Reach Goals: Cognitive deficits      Swallow Study   General HPI: Pt is a 72 y.o. male with medical history significant for HTN; HLD; DM; CKD; and dementia presenting with AMS. Per MD note, pt with AKI due to severe dehydration. CXR and CT Head negative. Type of Study: Bedside Swallow Evaluation Previous Swallow Assessment: none in chart Diet Prior to this Study: NPO Temperature Spikes Noted: No Respiratory Status: Nasal cannula History of Recent Intubation: No Behavior/Cognition: Lethargic/Drowsy;Doesn't follow directions Oral Cavity Assessment: Dry;Dried secretions Oral Care Completed by SLP: Yes Oral Cavity - Dentition: (difficult to visualize, will need to assess further) Self-Feeding Abilities: Total assist Patient Positioning: Upright in bed Baseline Vocal Quality: Not observed Volitional Cough: Cognitively unable to elicit Volitional Swallow: Unable to elicit    Oral/Motor/Sensory Function Overall Oral Motor/Sensory Function: (unable to follow commands for assessment)   Ice Chips Ice chips: Impaired Presentation: Spoon Oral Phase Impairments: Poor awareness of bolus   Thin Liquid Thin Liquid: Not tested    Nectar Thick Nectar Thick Liquid: Not tested   Honey Thick Honey Thick Liquid: Not tested   Puree Puree: Not tested   Solid     Solid: Not tested      Richard Powers, Richard Powers 05/18/2018,8:25 AM  Richard Powers, M.A. CCC-SLP [facility]balist[facility] Services Pager [phone] Office [phone]

## 2018-05-18 NOTE — Care Management Note (Signed)
Case Management Note  Patient Details  Name: [name] MRN: [id number] Date of Birth: [birth year]  Subjective/Objective:       Admitted with severe dehydration/ AMS, hx of HTN; HLD; DM; CKD; and dementia[facility]            PCP:  Action/Plan: Transition back to SNF when medically stable...palliative consult pending...PT/OT evaluations in place... CSW aware pt from SNF and following. NCM will continue to monitor for transitional care needs.  Expected Discharge Date:    05/18/2018          Expected Discharge Plan:  Skilled Nursing Facility  In-House Referral:  Clinical Social Work  Discharge planning Services  CM Consult  Post Acute Care Choice:    Choice offered to:     DME Arranged:    DME Agency:     HH Arranged:    HH Agency:     Status of Service:  In process, will continue to follow  If discussed at Long Length of Stay Meetings, dates discussed:    Additional Comments:  Epifanio LeschesCole, Kyndall Chaplin Hudson, RN 05/18/2018, 10:33 AM

## 2018-05-19 ENCOUNTER — Inpatient Hospital Stay (HOSPITAL_COMMUNITY): Payer: Medicare Other

## 2018-05-19 DIAGNOSIS — E440 Moderate protein-calorie malnutrition: Secondary | ICD-10-CM

## 2018-05-19 LAB — GLUCOSE, CAPILLARY
GLUCOSE-CAPILLARY: 137 mg/dL — AB (ref 70–99)
GLUCOSE-CAPILLARY: 147 mg/dL — AB (ref 70–99)
GLUCOSE-CAPILLARY: 148 mg/dL — AB (ref 70–99)
GLUCOSE-CAPILLARY: 214 mg/dL — AB (ref 70–99)
Glucose-Capillary: 109 mg/dL — ABNORMAL HIGH (ref 70–99)
Glucose-Capillary: 120 mg/dL — ABNORMAL HIGH (ref 70–99)
Glucose-Capillary: 137 mg/dL — ABNORMAL HIGH (ref 70–99)
Glucose-Capillary: 139 mg/dL — ABNORMAL HIGH (ref 70–99)
Glucose-Capillary: 140 mg/dL — ABNORMAL HIGH (ref 70–99)
Glucose-Capillary: 165 mg/dL — ABNORMAL HIGH (ref 70–99)
Glucose-Capillary: 166 mg/dL — ABNORMAL HIGH (ref 70–99)
Glucose-Capillary: 167 mg/dL — ABNORMAL HIGH (ref 70–99)
Glucose-Capillary: 195 mg/dL — ABNORMAL HIGH (ref 70–99)

## 2018-05-19 LAB — BASIC METABOLIC PANEL
Anion gap: 15 (ref 5–15)
BUN: 112 mg/dL — AB (ref 8–23)
CHLORIDE: 110 mmol/L (ref 98–111)
CO2: 33 mmol/L — ABNORMAL HIGH (ref 22–32)
CREATININE: 3.36 mg/dL — AB (ref 0.61–1.24)
Calcium: 7.3 mg/dL — ABNORMAL LOW (ref 8.9–10.3)
GFR calc Af Amer: 20 mL/min — ABNORMAL LOW (ref >60)
GFR, EST NON AFRICAN AMERICAN: 17 mL/min — AB (ref >60)
Glucose, Bld: 140 mg/dL — ABNORMAL HIGH (ref 70–99)
POTASSIUM: 3 mmol/L — AB (ref 3.5–5.1)
SODIUM: 158 mmol/L — AB (ref 135–145)

## 2018-05-19 LAB — MAGNESIUM: MAGNESIUM: 3.4 mg/dL — AB (ref 1.7–2.4)

## 2018-05-19 LAB — CBC
HCT: 30.4 % — ABNORMAL LOW (ref 39.0–52.0)
HCT: 35 % — ABNORMAL LOW (ref 39.0–52.0)
HEMOGLOBIN: 9.5 g/dL — AB (ref 13.0–17.0)
HEMOGLOBIN: 11 g/dL — AB (ref 13.0–17.0)
MCH: 30.4 pg (ref 26.0–34.0)
MCH: 30.2 pg (ref 26.0–34.0)
MCHC: 31.3 g/dL (ref 30.0–36.0)
MCHC: 31.4 g/dL (ref 30.0–36.0)
MCV: 97.1 fL (ref 78.0–100.0)
MCV: 96.2 fL (ref 78.0–100.0)
Platelets: 75 10*3/uL — ABNORMAL LOW (ref 150–400)
Platelets: 91 10*3/uL — ABNORMAL LOW (ref 150–400)
RBC: 3.13 MIL/uL — ABNORMAL LOW (ref 4.22–5.81)
RBC: 3.64 MIL/uL — ABNORMAL LOW (ref 4.22–5.81)
RDW: 14.1 % (ref 11.5–15.5)
RDW: 13.9 % (ref 11.5–15.5)
WBC: 5.3 10*3/uL (ref 4.0–10.5)
WBC: 6.1 10*3/uL (ref 4.0–10.5)

## 2018-05-19 LAB — PHOSPHORUS: Phosphorus: 2.7 mg/dL (ref 2.5–4.6)

## 2018-05-19 MED ORDER — INSULIN ASPART 100 UNIT/ML ~~LOC~~ SOLN
0.0000 [IU] | SUBCUTANEOUS | Status: DC
  Administered 2018-05-19: 2 [IU] via SUBCUTANEOUS
  Administered 2018-05-19 (×2): 3 [IU] via SUBCUTANEOUS
  Administered 2018-05-20 – 2018-05-21 (×3): 2 [IU] via SUBCUTANEOUS

## 2018-05-19 MED ORDER — ASPIRIN 300 MG RE SUPP
150.0000 mg | Freq: Every day | RECTAL | Status: DC
  Administered 2018-05-19 – 2018-05-23 (×5): 150 mg via RECTAL
  Filled 2018-05-19 (×5): qty 1

## 2018-05-19 MED ORDER — THIAMINE HCL 100 MG/ML IJ SOLN
100.0000 mg | Freq: Every day | INTRAMUSCULAR | Status: DC
  Administered 2018-05-19 – 2018-05-23 (×5): 100 mg via INTRAVENOUS
  Filled 2018-05-19 (×5): qty 2

## 2018-05-19 MED ORDER — METOPROLOL TARTRATE 5 MG/5ML IV SOLN
5.0000 mg | Freq: Four times a day (QID) | INTRAVENOUS | Status: DC | PRN
  Administered 2018-05-19: 5 mg via INTRAVENOUS
  Filled 2018-05-19: qty 5

## 2018-05-19 MED ORDER — INSULIN GLARGINE 100 UNIT/ML ~~LOC~~ SOLN
10.0000 [IU] | Freq: Every day | SUBCUTANEOUS | Status: DC
  Administered 2018-05-19 – 2018-05-22 (×4): 10 [IU] via SUBCUTANEOUS
  Filled 2018-05-19 (×5): qty 0.1

## 2018-05-19 MED ORDER — POTASSIUM CHLORIDE 10 MEQ/100ML IV SOLN
10.0000 meq | INTRAVENOUS | Status: AC
  Administered 2018-05-19 (×6): 10 meq via INTRAVENOUS
  Filled 2018-05-19 (×6): qty 100

## 2018-05-19 MED ORDER — DEXTROSE 5 % IV SOLN
INTRAVENOUS | Status: AC
  Administered 2018-05-19 – 2018-05-20 (×3): via INTRAVENOUS

## 2018-05-19 NOTE — Progress Notes (Signed)
OT Cancellation Note  Patient Details Name: Richard Powers MRN: [id number] DOB: July 28, 1946   Cancelled Treatment:    Reason Eval/Treat Not Completed: Active bedrest order. Pt continues to remain with active bed rest orders, will await for updated activity orders. Will continue to follow.  Marcy SirenBreanna Chyanne Kohut, OT Supplemental Rehabilitation Services Pager [phone] Office [phone]   Richard Powers 05/19/2018, 7:58 AM

## 2018-05-19 NOTE — Progress Notes (Signed)
Daily Progress Note   Patient Name: Richard Powers       Date: 05/19/2018 DOB: 04-04-1946  Age: 72 y.o. MRN#: [id number] Attending Physician: Leroy SeaSingh, [name], MD Primary Care Physician: [name], [name], MD Admit Date: 05/17/2018  Reason for Consultation/Follow-up: Establishing goals of care and Hospice Evaluation  Subjective: Patient does not respond to me. RN reports the same.   Length of Stay: 2  Current Medications: Scheduled Meds:  . aspirin  150 mg Rectal Daily  . chlorhexidine  15 mL Mouth Rinse BID  . heparin injection (subcutaneous)  5,000 Units Subcutaneous Q8H  . insulin aspart  0-15 Units Subcutaneous Q4H  . insulin glargine  10 Units Subcutaneous Daily  . mouth rinse  15 mL Mouth Rinse q12n4p  . sodium chloride flush  3 mL Intravenous Q12H  . thiamine injection  100 mg Intravenous Daily    Continuous Infusions: . dextrose 100 mL/hr at 05/19/18 0944  . potassium chloride 10 mEq (05/19/18 1100)    PRN Meds: [DISCONTINUED] acetaminophen **OR** acetaminophen, bisacodyl, metoprolol tartrate, ondansetron **OR** ondansetron (ZOFRAN) IV, polyvinyl alcohol, sodium phosphate  Physical Exam         Constitutional: No distress.  HENT:  Head: Normocephalic and atraumatic.  Cardiovascular: Regular rhythm. Tachycardia present.  Pulmonary/Chest: Effort normal and breath sounds normal. No accessory muscle usage. No tachypnea. No respiratory distress.  Abdominal: Soft. Bowel sounds are normal.  Musculoskeletal: He exhibits no edema.  Neurological: He is unresponsive.  Skin: Skin is warm and dry. He is not diaphoretic.   Vital Signs: BP (!) 118/97   Pulse 84   Temp 98 F (36.7 C) (Oral)   Resp 14   Ht 5' 7.99" (1.727 m)   Wt 45.4 kg   SpO2 100%   BMI 15.22 kg/m  SpO2:  SpO2: 100 % O2 Device: O2 Device: Nasal Cannula O2 Flow Rate: O2 Flow Rate (L/min): 2 L/min  Intake/output summary:   Intake/Output Summary (Last 24 hours) at 05/19/2018 1111 Last data filed at 05/19/2018 0305 Gross per 24 hour  Intake 2118.26 ml  Output 300 ml  Net 1818.26 ml   LBM: Last BM Date: 05/18/18 Baseline Weight: Weight: 45.4 kg Most recent weight: Weight: 45.4 kg       Palliative Assessment/Data: PPS 10%    Flowsheet Rows     Most Recent Value  Intake Tab  Referral Department  Hospitalist  Unit at Time of Referral  Intermediate Care Unit  Palliative Care Primary Diagnosis  Other (Comment)  Date Notified  05/18/18  Palliative Care Type  New Palliative care  Reason for referral  Clarify Goals of Care  Date of Admission  05/17/18  Date first seen by Palliative Care  05/18/18  # of days Palliative referral response time  0 Day(s)  # of days IP prior to Palliative referral  1  Clinical Assessment  Palliative Performance Scale Score  10%  Psychosocial & Spiritual Assessment  Palliative Care Outcomes  Patient/Family meeting held?  Yes  Who was at the meeting?  niece, cousin  Palliative Care Outcomes  Clarified goals of care, Provided psychosocial or spiritual support, Changed CPR status, Transitioned to hospice, ACP counseling assistance, Counseled regarding hospice,  Provided advance care planning      Patient Active Problem List   Diagnosis Date Noted  . Malnutrition of moderate degree 05/19/2018  . Goals of care, counseling/discussion   . Palliative care by specialist   . Severe dehydration 05/17/2018  . Acute renal failure superimposed on stage 2 chronic kidney disease (HCC) 05/17/2018  . Closed left hip fracture, initial encounter (HCC) 02/20/2018  . CKD (chronic kidney disease) stage 2, GFR 60-89 ml/min 02/20/2018  . Closed left hip fracture (HCC) 02/20/2018  . Dehydration 11/16/2017  . AKI (acute kidney injury) (HCC) 11/16/2017  . Acute encephalopathy  11/16/2017  . Elevated lactic acid level 11/16/2017  . Glaucoma 10/17/2017  . Dementia 09/15/2017  . Sepsis (HCC) 09/09/2017  . Essential hypertension 09/09/2017  . DM (diabetes mellitus), type 2 with renal complications (HCC) 09/09/2017    Palliative Care Assessment & Plan   HPI: 72 y.o. male  with past medical history of HTN, HLD, CKD, depression, DM, and dementia admitted on 05/17/2018 with altered mental status. Patient resides in SNF. He was found to be severely dehydrated with acute renal failure. Labs revealed hyperkalemia, hyponatremia, and hypomagnesemia. Head CT and renal ultrasound negative for acute complications. Patient evaluated by SLP but did not arouse sufficiently for PO intake and deemed a severe aspiration risk. PMT consulted for GOC.  Assessment: Spoke with Angelique Blonder this AM. We talked about patient's improvement in lab work; however, remains encephalopathic. We discussed continuing care, but if patient's mental status does not improve may be more appropriate for hospice home instead of hospice at facility. She agrees. 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 Emotional support provided. All questions addressed.   Recommendations/Plan: - family focused on patient being discharged to different SNF - discussed with CSW - request hospice care at facility upon discharge once dehydration corrected; if patient does not improve family would be agreeable to hospice home - PMT will continue to follow and update family - code status changed to DNR per family wishes  Code Status:  DNR  Prognosis:   Unable to determine - poor prognosis r/t advanced dementia, functional and nutritional decline  Discharge Planning:  Skilled Nursing Facility with Hospice  Vs residential hospice depending on patient's clinical improvement   Care plan was discussed with Dr. Thedore Mins, bedside RN, niece Angelique Blonder  Thank you for allowing the Palliative Medicine Team to  assist in the care of this patient.   Total Time 25 minutes Prolonged Time Billed  no       Greater than 50%  of this time was spent counseling and coordinating care related to the above assessment and plan.  Gerlean Ren, DNP, [facility] Palliative Medicine Team Team Phone # [phone]  Pager [phone]

## 2018-05-19 NOTE — Progress Notes (Signed)
@IPLOG @        PROGRESS NOTE                                                                                                                                                                                                             Patient Demographics:    Richard Powers, is a 72 y.o. male, DOB - 1946-04-12, ZOX:[id number]  Admit date - 05/17/2018   Admitting Physician Jonah Blue, MD  Outpatient Primary MD for the patient is [name], MD  LOS - 2  Chief Complaint  Patient presents with  . Altered Mental Status       Brief Narrative   Richard Powers is a 72 y.o. male with medical history significant of HTN; HLD; DM; CKD; and dementia presenting with AMS started apparently 2 days ago at SNF, in the ER he was found to be severely dehydrated, and acute renal failure was obtunded, head CT chest x-ray and renal ultrasound were nonacute and he was admitted for further treatment.   Subjective:    Richard Powers today is in bed, unable to answer questions or follow commands.   Assessment  & Plan :     1.  Severe dehydration with hyperkalemia, hypernatremia, acute renal failure along with wide anion gap metabolic acidosis likely due to combination of DKA along with lactic acidosis caused by metformin.    His metabolic acidosis and anion gap of improved, DKA seems to have resolved, still quite dehydrated and hypernatremic, a new D5W drip and monitor electrolytes.  Will transition to Lantus and every 4 hours sliding scale for now.  2.  Severe metabolic encephalopathy.  Due to #1 above.  CT head nonacute.  Supportive care.  Now n.p.o.  Speech following.  Get MRI on 05/19/2028 to rule out CVA according to the niece patient had 2-3 falls few days prior to hospital admission at [facility] and has been lethargic and unresponsive for at least 2 days prior to hospital admission.  For now aspirin rectal suppository added as well.  3.  Hypokalemia.  Replaced and monitor.  4.  Underlying  Mild-Moderate dementia.  For now supportive care, at risk for delirium.  Commence home medications once mentation has improved.  Per staff nursing home informed us that patient is fairly active at baseline however unable to provide any history at this time and clearly is obtunded.  5.  Hypertension.  Currently n.p.o. due to encephalopathy.  PRN IV hydralazine.    Family Communication  : updated niece on 05/19/2018 at  9:25 AM who lives locally and primary decision maker on his poor prognosis, DNR status and the plan to treat with medications, if not better transition to comfort measures.  Code Status :  Full, question baseline quality of care, question long-term prognosis.  Will involve palliative care as well.  Disposition Plan  :  Tele  Consults  :  Renal  Procedures  :    CT head, renal ultrasound and chest x-ray.  All nonacute.  DVT Prophylaxis  :   Heparin   Lab Results  Component Value Date   PLT 91 (L) 05/19/2018    Diet :  Diet Order            Diet NPO time specified  Diet effective now               Inpatient Medications Scheduled Meds: . aspirin  150 mg Rectal Daily  . chlorhexidine  15 mL Mouth Rinse BID  . heparin injection (subcutaneous)  5,000 Units Subcutaneous Q8H  . insulin aspart  0-15 Units Subcutaneous Q4H  . insulin glargine  10 Units Subcutaneous Daily  . mouth rinse  15 mL Mouth Rinse q12n4p  . sodium chloride flush  3 mL Intravenous Q12H  . thiamine injection  100 mg Intravenous Daily   Continuous Infusions: . dextrose    . potassium chloride     PRN Meds:.[DISCONTINUED] acetaminophen **OR** acetaminophen, bisacodyl, ondansetron **OR** ondansetron (ZOFRAN) IV, polyvinyl alcohol, sodium phosphate  Antibiotics  :   Anti-infectives (From admission, onward)   Start     Dose/Rate Route Frequency Ordered Stop   05/17/18 0715  cefTRIAXone (ROCEPHIN) 1 g in sodium chloride 0.9 % 100 mL IVPB     1 g 200 mL/hr over 30 Minutes Intravenous  Once  05/17/18 0703 05/17/18 0836          Objective:   Vitals:   05/18/18 1950 05/19/18 0005 05/19/18 0401 05/19/18 0816  BP: (!) 115/96 (!) 122/98 108/85 (!) 118/97  Pulse: (!) 115 (!) 126 97 84  Resp: 10 (!) 8 (!) 9 14  Temp: 97.6 F (36.4 C) 98.2 F (36.8 C) 97.8 F (36.6 C) 98 F (36.7 C)  TempSrc: Axillary Oral Axillary Oral  SpO2: 100% 100% 100% 100%  Weight:      Height:        Wt Readings from Last 3 Encounters:  05/18/18 45.4 kg  02/21/18 65.9 kg  11/24/17 75.9 kg     Intake/Output Summary (Last 24 hours) at 05/19/2018 0930 Last data filed at 05/19/2018 0305 Gross per 24 hour  Intake 2118.26 ml  Output 300 ml  Net 1818.26 ml     Physical Exam  Somnolent, unable to follow commands, does withdraw to painful stimuli in all 4 extremities  [place].AT,PERRAL Supple Neck,No JVD, No cervical lymphadenopathy appriciated.  Symmetrical Chest wall movement, Good air movement bilaterally, CTAB RRR,No Gallops, Rubs or new Murmurs, No Parasternal Heave +ve B.Sounds, Abd Soft, No tenderness, No organomegaly appriciated, No rebound - guarding or rigidity. No Cyanosis, Clubbing or edema, No new Rash or bruise    Data Review:    CBC Recent Labs  Lab 05/17/18 0525 05/17/18 0807 05/17/18 1503 05/18/18 0938 05/19/18 0500 05/19/18 0748  WBC 4.9 10.2 5.8 7.7 5.3 6.1  HGB 6.8* 12.9* 12.9* 11.6* 9.5* 11.0*  HCT 24.8* 43.4 44.3 37.8* 30.4* 35.0*  PLT 64* 149* 122* PLATELET CLUMPS NOTED ON SMEAR, UNABLE TO ESTIMATE 75* 91*  MCV 114.3* 104.8* 105.0* 98.2 97.1 96.2  MCH 31.3 31.2 30.6 30.1 30.4 30.2  MCHC 27.4* 29.7* 29.1* 30.7 31.3 31.4  RDW 14.3 14.3 14.2 14.2 14.1 13.9  LYMPHSABS 0.3*  --   --   --   --   --   MONOABS 0.2  --   --   --   --   --   EOSABS 0.0  --   --   --   --   --   BASOSABS 0.0  --   --   --   --   --     Chemistries  Recent Labs  Lab 05/17/18 0525 05/17/18 0807  05/17/18 2019 05/18/18 0011 05/18/18 0643 05/18/18 1754 05/19/18 0748  NA  --   168*   < > 164* 165* 160* 157* 158*  K  --  6.1*   < > 4.5 4.6 4.5 4.1 3.0*  CL  --  129*   < > 124* 126* 120* 114* 110  CO2  --  13*   < > 16* 15* 19* 26 33*  GLUCOSE  --  435*   < > 296* 293* 202* 136* 140*  BUN  --  166*   < > 155* 155* 149* 135* 112*  CREATININE  --  6.50*   < > 5.44* 5.57* 5.05* 4.43* 3.36*  CALCIUM  --  8.2*   < > 8.1* 8.0* 7.6* 7.3* 7.3*  MG 1.2*  --   --   --   --   --   --  3.4*  AST  --  20  --   --   --  20  --   --   ALT  --  38  --   --   --  29  --   --   ALKPHOS  --  137*  --   --   --  112  --   --   BILITOT  --  2.3*  --   --   --  1.5*  --   --    < > = values in this interval not displayed.   ------------------------------------------------------------------------------------------------------------------ No results for input(s): CHOL, HDL, LDLCALC, TRIG, CHOLHDL, LDLDIRECT in the last 72 hours.  Lab Results  Component Value Date   HGBA1C 7.5 (H) 05/17/2018   ------------------------------------------------------------------------------------------------------------------ No results for input(s): TSH, T4TOTAL, T3FREE, THYROIDAB in the last 72 hours.  Invalid input(s): FREET3 ------------------------------------------------------------------------------------------------------------------ No results for input(s): VITAMINB12, FOLATE, FERRITIN, TIBC, IRON, RETICCTPCT in the last 72 hours.  Coagulation profile No results for input(s): INR, PROTIME in the last 168 hours.  No results for input(s): DDIMER in the last 72 hours.  Cardiac Enzymes No results for input(s): CKMB, TROPONINI, MYOGLOBIN in the last 168 hours.  Invalid input(s): CK ------------------------------------------------------------------------------------------------------------------ No results found for: BNP  Micro Results Recent Results (from the past 240 hour(s))  Urine culture     Status: None   Collection Time: 05/17/18  5:36 AM  Result Value Ref Range Status   Specimen  Description URINE, RANDOM  Final   Special Requests NONE  Final   Culture   Final    NO GROWTH Performed at [facility], [address] [place], [place] [address]    Report Status 05/18/2018 FINAL  Final  Culture, blood (Routine X 2) w Reflex to ID Panel     Status: None (Preliminary result)   Collection Time: 05/17/18  9:40 AM  Result Value Ref Range Status   Specimen Description BLOOD RIGHT WRIST  Final   Special Requests   Final    BOTTLES DRAWN AEROBIC ONLY Blood Culture results may not be optimal due to an inadequate volume of blood received in culture bottles   Culture   Final    NO GROWTH 2 DAYS Performed at [facility], [address], [place], [place] [address]    Report Status PENDING  Incomplete  Culture, blood (Routine X 2) w Reflex to ID Panel     Status: None (Preliminary result)   Collection Time: 05/17/18  9:45 AM  Result Value Ref Range Status   Specimen Description BLOOD RIGHT HAND  Final   Special Requests   Final    BOTTLES DRAWN AEROBIC AND ANAEROBIC Blood Culture results may not be optimal due to an inadequate volume of blood received in culture bottles   Culture   Final    NO GROWTH 2 DAYS Performed at [facility], [address], [place], [place] [address]    Report Status PENDING  Incomplete  MRSA PCR Screening     Status: None   Collection Time: 05/17/18  4:24 PM  Result Value Ref Range Status   MRSA by PCR NEGATIVE NEGATIVE Final    Comment:        The GeneXpert MRSA Assay (FDA approved for NASAL specimens only), is one component of a comprehensive MRSA colonization surveillance program. It is not intended to diagnose MRSA infection nor to guide or monitor treatment for MRSA infections. Performed at [facility], [address], [place], [place] [address]     Radiology Reports Ct Head Wo Contrast  Result Date: 05/17/2018 CLINICAL DATA:  Unresponsive for 2 days. Unable to obtain a blood pressure. Right-sided  facial droop. EXAM: CT HEAD WITHOUT CONTRAST TECHNIQUE: Contiguous axial images were obtained from the base of the skull through the vertex without intravenous contrast. COMPARISON:  02/20/2018 FINDINGS: Brain: Diffuse cerebral atrophy. Ventricular dilatation consistent with central atrophy. Cavum septum pellucidum. Low-attenuation changes throughout the deep white matter consistent with small vessel ischemic changes. No mass-effect or midline shift. No abnormal extra-axial fluid collections. Gray-white matter junctions are distinct. Basal cisterns are not effaced. No acute intracranial hemorrhage. Vascular: Mild intracranial arterial calcifications are present. Skull: Calvarium appears intact. Sinuses/Orbits: Paranasal sinuses and mastoid air cells are clear. Other: None. IMPRESSION: No acute intracranial abnormalities. Chronic atrophy and small vessel ischemic changes. Electronically Signed   By: Burman Nieves M.D.   On: 05/17/2018 06:09   US Renal  Result Date: 05/17/2018 CLINICAL DATA:  Acute renal failure. EXAM: RENAL / URINARY TRACT ULTRASOUND COMPLETE COMPARISON:  Renal ultrasound dated 11/16/2017 and CT scan of the abdomen dated 01/22/2015 FINDINGS: Right Kidney: Length: 10.1 cm. 5 mm stone in the mid right kidney, unchanged since the prior CT scan. No hydronephrosis. Echogenicity is normal. No masses. Left Kidney: Length: 10.3 cm. Echogenicity is normal. No hydronephrosis or mass. Bladder: Appears normal for degree of bladder distention.Prostate gland appears prominent. IMPRESSION: 1. No acute abnormality. 2. Chronic small stone in the mid right kidney. Electronically Signed   By: Francene Boyers M.D.   On: 05/17/2018 14:39   Dg Chest Port 1 View  Result Date: 05/17/2018 CLINICAL DATA:  Initial evaluation for acute unresponsiveness. EXAM: PORTABLE CHEST 1 VIEW COMPARISON:  Prior radiograph from 02/20/2018. FINDINGS: Cardiac and mediastinal silhouettes are stable in size and contour, and remain  within normal limits. Aortic atherosclerosis. Lungs hypoinflated. No focal infiltrate, pulmonary edema or pleural effusion. No pneumothorax. No  acute osseus abnormality. IMPRESSION: 1. No active cardiopulmonary disease. 2. Aortic atherosclerosis. Electronically Signed   By: Rise Mu M.D.   On: 05/17/2018 05:43    Time Spent in minutes  30   Susa Raring M.D on 05/19/2018 at 9:30 AM  To page go to [email or web address] - password [facility]

## 2018-05-19 NOTE — Progress Notes (Signed)
CSW left voicemail for patient's niece to discuss discharge plan.   Osborne Cascoadia Shadavia Dampier LCSW [phone]

## 2018-05-19 NOTE — Care Management Important Message (Signed)
Important Message  Patient Details  Name: Richard Powers MRN: [id number] Date of Birth: 1946/04/06   Medicare Important Message Given:  Yes    Dorena BodoIris Embree Brawley 05/19/2018, 2:20 PM

## 2018-05-19 NOTE — Progress Notes (Signed)
On assessment, the patient's right upper extremity was noted to be swollen.  Of note, the patient's IV infusion was running in the right upper arm access.    Suspecting possible infiltration of the IV, I transferred the infusion to his access in his left wrist.  I also elevated the right extremity with pillows to try to decrease swelling.  Will continue to monitor.

## 2018-05-19 NOTE — Progress Notes (Signed)
S: remains unresponsive O:BP (!) 118/97   Pulse 84   Temp 98 F (36.7 C) (Oral)   Resp 14   Ht 5' 7.99" (1.727 m)   Wt 45.4 kg   SpO2 100%   BMI 15.22 kg/m   Intake/Output Summary (Last 24 hours) at 05/19/2018 1303 Last data filed at 05/19/2018 0305 Gross per 24 hour  Intake 2118.26 ml  Output 300 ml  Net 1818.26 ml   Intake/Output: I/O last 3 completed shifts: In: 4341.3 [I.V.:4091.3; Other:200; IV Piggyback:50] Out: 700 [Urine:700]  Intake/Output this shift:  No intake/output data recorded. Weight change:  Gen: unresponsive CVS:no rub Resp: cta Abd: +BS, soft, NT/ND Ext: no edema  Recent Labs  Lab 05/17/18 0807 05/17/18 1127 05/17/18 1503 05/17/18 2019 05/18/18 0011 05/18/18 0643 05/18/18 1754 05/19/18 0748  NA 168* 169* 169* 164* 165* 160* 157* 158*  K 6.1* 4.9 4.8 4.5 4.6 4.5 4.1 3.0*  CL 129* >130* >130* 124* 126* 120* 114* 110  CO2 13* 11* 16* 16* 15* 19* 26 33*  GLUCOSE 435* 318* 257* 296* 293* 202* 136* 140*  BUN 166* 156* 158* 155* 155* 149* 135* 112*  CREATININE 6.50* 6.05* 5.78* 5.44* 5.57* 5.05* 4.43* 3.36*  ALBUMIN 2.7*  --   --   --   --  2.5*  --   --   CALCIUM 8.2* 8.2* 8.2* 8.1* 8.0* 7.6* 7.3* 7.3*  PHOS  --   --   --   --   --   --   --  2.7  AST 20  --   --   --   --  20  --   --   ALT 38  --   --   --   --  29  --   --    Liver Function Tests: Recent Labs  Lab 05/17/18 0807 05/18/18 0643  AST 20 20  ALT 38 29  ALKPHOS 137* 112  BILITOT 2.3* 1.5*  PROT 5.4* 5.1*  ALBUMIN 2.7* 2.5*   No results for input(s): LIPASE, AMYLASE in the last 168 hours. Recent Labs  Lab 05/17/18 0524  AMMONIA 44*   CBC: Recent Labs  Lab 05/17/18 0525 05/17/18 0807 05/17/18 1503 05/18/18 0938 05/19/18 0500 05/19/18 0748  WBC 4.9 10.2 5.8 7.7 5.3 6.1  NEUTROABS 4.4  --   --   --   --   --   HGB 6.8* 12.9* 12.9* 11.6* 9.5* 11.0*  HCT 24.8* 43.4 44.3 37.8* 30.4* 35.0*  MCV 114.3* 104.8* 105.0* 98.2 97.1 96.2  PLT 64* 149* 122* PLATELET CLUMPS  NOTED ON SMEAR, UNABLE TO ESTIMATE 75* 91*   Cardiac Enzymes: Recent Labs  Lab 05/17/18 1503  CKTOTAL 68   CBG: Recent Labs  Lab 05/19/18 0503 05/19/18 0608 05/19/18 0713 05/19/18 0808 05/19/18 0922  GLUCAP 140* 147* 148* 139* 166*    Iron Studies: No results for input(s): IRON, TIBC, TRANSFERRIN, FERRITIN in the last 72 hours. Studies/Results: Mr Brain Wo Contrast  Result Date: 05/19/2018 CLINICAL DATA:  Cerebral hemorrhage suspected. Altered mental status beginning 2 days ago at skilled nurse facility. EXAM: MRI HEAD WITHOUT CONTRAST TECHNIQUE: Multiplanar, multiecho pulse sequences of the brain and surrounding structures were obtained without intravenous contrast. COMPARISON:  None. FINDINGS: Brain: Moderate atrophy and white matter changes are present bilaterally. No acute hemorrhage is present. Dilated perivascular spaces are evident. 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 White matter changes extend into the brainstem. Susceptibility weighted imaging demonstrates  4 punctate foci susceptibility, 1 in the left frontal lobe, 1 in the right cerebellum, and 2 in the right parietal lobe. A cavum septum pellucidum is noted. Ventricles are otherwise proportionate to the degree of atrophy. Brainstem and cerebellum are within normal limits. Vascular: Flow is present in the major intracranial arteries Skull and upper cervical spine: The skull base is within normal limits. Craniocervical junction is normal. Sinuses/Orbits: The paranasal sinuses and mastoid air cells are clear. Globes and orbits are within normal limits. IMPRESSION: 1. No acute hemorrhage or infarct. 2. Advanced atrophy and white matter disease. This likely reflects the sequela of chronic microvascular ischemia. 3. At least 4 punctate foci of susceptibility, 1 in the left frontal lobe, 2 in the right parietal lobe, and 1 in the cerebellum. These likely reflect microhemorrhage associated with underlying  vasculitis. Electronically Signed   By: Marin Roberts M.D.   On: 05/19/2018 12:52   US Renal  Result Date: 05/17/2018 CLINICAL DATA:  Acute renal failure. EXAM: RENAL / URINARY TRACT ULTRASOUND COMPLETE COMPARISON:  Renal ultrasound dated 11/16/2017 and CT scan of the abdomen dated 01/22/2015 FINDINGS: Right Kidney: Length: 10.1 cm. 5 mm stone in the mid right kidney, unchanged since the prior CT scan. No hydronephrosis. Echogenicity is normal. No masses. Left Kidney: Length: 10.3 cm. Echogenicity is normal. No hydronephrosis or mass. Bladder: Appears normal for degree of bladder distention.Prostate gland appears prominent. IMPRESSION: 1. No acute abnormality. 2. Chronic small stone in the mid right kidney. Electronically Signed   By: Francene Boyers M.D.   On: 05/17/2018 14:39   . aspirin  150 mg Rectal Daily  . chlorhexidine  15 mL Mouth Rinse BID  . heparin injection (subcutaneous)  5,000 Units Subcutaneous Q8H  . insulin aspart  0-15 Units Subcutaneous Q4H  . insulin glargine  10 Units Subcutaneous Daily  . mouth rinse  15 mL Mouth Rinse q12n4p  . sodium chloride flush  3 mL Intravenous Q12H  . thiamine injection  100 mg Intravenous Daily    BMET    Component Value Date/Time   NA 158 (H) 05/19/2018 0748   K 3.0 (L) 05/19/2018 0748   CL 110 05/19/2018 0748   CO2 33 (H) 05/19/2018 0748   GLUCOSE 140 (H) 05/19/2018 0748   BUN 112 (H) 05/19/2018 0748   CREATININE 3.36 (H) 05/19/2018 0748   CALCIUM 7.3 (L) 05/19/2018 0748   GFRNONAA 17 (L) 05/19/2018 0748   GFRAA 20 (L) 05/19/2018 0748   CBC    Component Value Date/Time   WBC 6.1 05/19/2018 0748   RBC 3.64 (L) 05/19/2018 0748   HGB 11.0 (L) 05/19/2018 0748   HCT 35.0 (L) 05/19/2018 0748   PLT 91 (L) 05/19/2018 0748   MCV 96.2 05/19/2018 0748   MCH 30.2 05/19/2018 0748   MCHC 31.4 05/19/2018 0748   RDW 13.9 05/19/2018 0748   LYMPHSABS 0.3 (L) 05/17/2018 0525   MONOABS 0.2 05/17/2018 0525   EOSABS 0.0 05/17/2018 0525    BASOSABS 0.0 05/17/2018 0525     Assessment/Plan: 1. AKI/CKD stage 3- appears to be in the setting of severe volume depletion and dehydration.  1. Improving with IVF"s  2. renal US without hydro but has chronic right nephrolithiasis 3. No indication for HD at this time. 2. Metabolic acidosis- due to #1 and metformin. Stopped metformin and started sterile water with 2 amps of bicarb 1. Resolved with bicarb 2. Ok to change to D5W per Dr. Thedore Mins for possible DKA 3. Lactic acidosis-  due to AKI in setting of concomitant metformin. Would not restart metformin.  1. Treat with bicarb gtt as above. 4. Hypernatremia due to dehydration- improving with hypotonic bicarb at an acceptable rate.  Continue with hypotonic IVF's and follow. 5. Hyperkalemia- due to #1 and #2, improved and now low.  Ok to replete and follow 6. HTN- stable 7. Moderate protein malnutrition- will need to start protein supplements when able to take po 8. Dementia- per primary svc. 9. DM- hyperglycemic. Start insulin per primary svc 10. Anemia- possibly due to CKD stage 3 but also need to r/o GIB. Increased Hgb from baseline of 9-10 likely due to dehydration and concentration effect. Follow h/h and transfuse prn. 11. FTT- consider palliative care consulted to help set goals/limits of care in an elderly, SNF resident with progressive dementia who now DNR and will have hospice care at his new SNF when ready for discharge.  Irena CordsJoseph A. Burwell Bethel, MD [facility] [phone]

## 2018-05-19 NOTE — Progress Notes (Addendum)
PT Cancellation Note  Patient Details Name: Richard Powers MRN: [id number] DOB: 09-Jun-1946   Cancelled Treatment:    Reason Eval/Treat Not Completed: Active bedrest order Pt continues to be on strict bedrest. Will await until activity orders are increased. Will follow.   Blake DivineShauna A Galileo Colello 05/19/2018, 7:38 AM Mylo RedShauna Richmond Coldren, PT, DPT Acute Rehabilitation Services Pager [phone] Office [phone]

## 2018-05-19 NOTE — Progress Notes (Signed)
  Speech Language Pathology Treatment: Dysphagia  Patient Details Name: Richard Powers MRN: [id number] DOB: 1945-09-23 Today's Date: 05/19/2018 Time: [id number] SLP Time Calculation (min) (ACUTE ONLY): 24 min  Assessment / Plan / Recommendation Clinical Impression  Pt continues to exhibit decreased responsiveness.  Pt opened eyes x1 during oral care.  SLP set up suction and provided extensive oral care prior to administration of PO trials.  Yellow/white secretions removed from oral cavity.  RN to continue oral care.  With ice chip, pt exhibited oral response and accepted ice chip with minimal oral manipulation.  SLP removed ice chip from oral cavity.  There was no pharyngeal swallow response.  With small amount of thin liquid by teaspoon, there was near total anterior loss of bolus trial.  SLP asked pt to swallow and he responded with a loud and high pitched "no" before becoming unresponsive again.    Recommend pt remain NPO at this time.  SLP will continue to follow for oral readiness. Consider conversation with family regarding alternate means of nutrition, either temporary or more long term, if pt continues to exhibit decreased responsiveness.    HPI HPI: Pt is a 72 y.o. male with medical history significant for HTN; HLD; DM; CKD; and dementia presenting with AMS. Per MD note, pt with AKI due to severe dehydration. CXR and CT Head negative.       SLP Plan  Continue with current plan of care       Recommendations  Diet recommendations: NPO Medication Administration: Via alternative means                Oral Care Recommendations: Oral care QID Follow up Recommendations: Skilled Nursing facility SLP Visit Diagnosis: Dysphagia, oropharyngeal phase (R13.12) Plan: Continue with current plan of care       GO                Kerrie PleasureLeigh E Saveah Bahar, MA, CCC-SLP Acute Rehabilitation Services Office: [phone] 05/19/2018, 10:19 AM

## 2018-05-19 NOTE — Care Management Important Message (Signed)
Important Message  Patient Details  Name: Richard Powers MRN: [id number] Date of Birth: 1946-04-07   Medicare Important Message Given:  Yes Correction: Patient was not in room  [facility] Keionna Kinnaird 05/19/2018, 2:21 PM

## 2018-05-20 DIAGNOSIS — Z515 Encounter for palliative care: Secondary | ICD-10-CM

## 2018-05-20 DIAGNOSIS — Z7189 Other specified counseling: Secondary | ICD-10-CM

## 2018-05-20 DIAGNOSIS — F0150 Vascular dementia without behavioral disturbance: Secondary | ICD-10-CM

## 2018-05-20 LAB — COMPREHENSIVE METABOLIC PANEL
ALBUMIN: 2.1 g/dL — AB (ref 3.5–5.0)
ALT: 20 U/L (ref 0–44)
AST: 25 U/L (ref 15–41)
Alkaline Phosphatase: 89 U/L (ref 38–126)
Anion gap: 10 (ref 5–15)
BUN: 80 mg/dL — AB (ref 8–23)
CHLORIDE: 107 mmol/L (ref 98–111)
CO2: 32 mmol/L (ref 22–32)
CREATININE: 2.55 mg/dL — AB (ref 0.61–1.24)
Calcium: 6.8 mg/dL — ABNORMAL LOW (ref 8.9–10.3)
GFR calc Af Amer: 27 mL/min — ABNORMAL LOW (ref >60)
GFR, EST NON AFRICAN AMERICAN: 24 mL/min — AB (ref >60)
GLUCOSE: 105 mg/dL — AB (ref 70–99)
Potassium: 2.9 mmol/L — ABNORMAL LOW (ref 3.5–5.1)
Sodium: 149 mmol/L — ABNORMAL HIGH (ref 135–145)
Total Bilirubin: 0.6 mg/dL (ref 0.3–1.2)
Total Protein: 4.6 g/dL — ABNORMAL LOW (ref 6.5–8.1)

## 2018-05-20 LAB — CBC
HEMATOCRIT: 30.3 % — AB (ref 39.0–52.0)
HEMOGLOBIN: 9.5 g/dL — AB (ref 13.0–17.0)
MCH: 30.4 pg (ref 26.0–34.0)
MCHC: 31.4 g/dL (ref 30.0–36.0)
MCV: 96.8 fL (ref 78.0–100.0)
Platelets: 80 10*3/uL — ABNORMAL LOW (ref 150–400)
RBC: 3.13 MIL/uL — ABNORMAL LOW (ref 4.22–5.81)
RDW: 13.7 % (ref 11.5–15.5)
WBC: 6 10*3/uL (ref 4.0–10.5)

## 2018-05-20 LAB — GLUCOSE, CAPILLARY
GLUCOSE-CAPILLARY: 104 mg/dL — AB (ref 70–99)
GLUCOSE-CAPILLARY: 108 mg/dL — AB (ref 70–99)
GLUCOSE-CAPILLARY: 117 mg/dL — AB (ref 70–99)

## 2018-05-20 LAB — MAGNESIUM: MAGNESIUM: 2.7 mg/dL — AB (ref 1.7–2.4)

## 2018-05-20 MED ORDER — POTASSIUM CHLORIDE 10 MEQ/100ML IV SOLN
10.0000 meq | INTRAVENOUS | Status: AC
  Administered 2018-05-20 (×6): 10 meq via INTRAVENOUS
  Filled 2018-05-20 (×7): qty 100

## 2018-05-20 MED ORDER — DEXTROSE 5 % IV SOLN
INTRAVENOUS | Status: DC
  Administered 2018-05-20 – 2018-05-21 (×2): via INTRAVENOUS

## 2018-05-20 MED ORDER — MAGNESIUM SULFATE IN D5W 1-5 GM/100ML-% IV SOLN: 1.0000 g | Freq: Once | INTRAVENOUS | Status: DC

## 2018-05-20 NOTE — Progress Notes (Signed)
S:No events overnight O:BP 136/86 (BP Location: Left Arm)   Pulse 78   Temp 98.1 F (36.7 C) (Axillary)   Resp (!) 21   Ht 5' 7.99" (1.727 m)   Wt 45.4 kg   SpO2 100%   BMI 15.22 kg/m   Intake/Output Summary (Last 24 hours) at 05/20/2018 1217 Last data filed at 05/20/2018 0910 Gross per 24 hour  Intake 1126.99 ml  Output 1200 ml  Net -73.01 ml   Intake/Output: I/O last 3 completed shifts: In: 2266.8 [I.V.:2101.3; IV Piggyback:165.5] Out: 900 [Urine:900]  Intake/Output this shift:  Total I/O In: 0  Out: 300 [Urine:300] Weight change:  Gen: lying in bed and unresponsive CVS:no rub Resp: cta Abd: benign Ext: no edema  Recent Labs  Lab 05/17/18 0807  05/17/18 1503 05/17/18 2019 05/18/18 0011 05/18/18 0643 05/18/18 1754 05/19/18 0748 05/20/18 0436  NA 168*   < > 169* 164* 165* 160* 157* 158* 149*  K 6.1*   < > 4.8 4.5 4.6 4.5 4.1 3.0* 2.9*  CL 129*   < > >130* 124* 126* 120* 114* 110 107  CO2 13*   < > 16* 16* 15* 19* 26 33* 32  GLUCOSE 435*   < > 257* 296* 293* 202* 136* 140* 105*  BUN 166*   < > 158* 155* 155* 149* 135* 112* 80*  CREATININE 6.50*   < > 5.78* 5.44* 5.57* 5.05* 4.43* 3.36* 2.55*  ALBUMIN 2.7*  --   --   --   --  2.5*  --   --  2.1*  CALCIUM 8.2*   < > 8.2* 8.1* 8.0* 7.6* 7.3* 7.3* 6.8*  PHOS  --   --   --   --   --   --   --  2.7  --   AST 20  --   --   --   --  20  --   --  25  ALT 38  --   --   --   --  29  --   --  20   < > = values in this interval not displayed.   Liver Function Tests: Recent Labs  Lab 05/17/18 0807 05/18/18 0643 05/20/18 0436  AST 20 20 25   ALT 38 29 20  ALKPHOS 137* 112 89  BILITOT 2.3* 1.5* 0.6  PROT 5.4* 5.1* 4.6*  ALBUMIN 2.7* 2.5* 2.1*   No results for input(s): LIPASE, AMYLASE in the last 168 hours. Recent Labs  Lab 05/17/18 0524  AMMONIA 44*   CBC: Recent Labs  Lab 05/17/18 0525  05/17/18 1503 05/18/18 0938 05/19/18 0500 05/19/18 0748 05/20/18 0436  WBC 4.9   < > 5.8 7.7 5.3 6.1 6.0   NEUTROABS 4.4  --   --   --   --   --   --   HGB 6.8*   < > 12.9* 11.6* 9.5* 11.0* 9.5*  HCT 24.8*   < > 44.3 37.8* 30.4* 35.0* 30.3*  MCV 114.3*   < > 105.0* 98.2 97.1 96.2 96.8  PLT 64*   < > 122* PLATELET CLUMPS NOTED ON SMEAR, UNABLE TO ESTIMATE 75* 91* 80*   < > = values in this interval not displayed.   Cardiac Enzymes: Recent Labs  Lab 05/17/18 1503  CKTOTAL 68   CBG: Recent Labs  Lab 05/19/18 1320 05/19/18 1559 05/19/18 2032 05/20/18 0014 05/20/18 0415  GLUCAP 109* 137* 167* 117* 108*    Iron Studies: No results for input(s):  IRON, TIBC, TRANSFERRIN, FERRITIN in the last 72 hours. Studies/Results: Mr Brain Wo Contrast  Result Date: 05/19/2018 CLINICAL DATA:  Cerebral hemorrhage suspected. Altered mental status beginning 2 days ago at skilled nurse facility. EXAM: MRI HEAD WITHOUT CONTRAST TECHNIQUE: Multiplanar, multiecho pulse sequences of the brain and surrounding structures were obtained without intravenous contrast. COMPARISON:  None. FINDINGS: Brain: Moderate atrophy and white matter changes are present bilaterally. No acute hemorrhage is present. Dilated perivascular spaces are evident. 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 White matter changes extend into the brainstem. Susceptibility weighted imaging demonstrates 4 punctate foci susceptibility, 1 in the left frontal lobe, 1 in the right cerebellum, and 2 in the right parietal lobe. A cavum septum pellucidum is noted. Ventricles are otherwise proportionate to the degree of atrophy. Brainstem and cerebellum are within normal limits. Vascular: Flow is present in the major intracranial arteries Skull and upper cervical spine: The skull base is within normal limits. Craniocervical junction is normal. Sinuses/Orbits: The paranasal sinuses and mastoid air cells are clear. Globes and orbits are within normal limits. IMPRESSION: 1. No acute hemorrhage or infarct. 2. Advanced atrophy and white matter  disease. This likely reflects the sequela of chronic microvascular ischemia. 3. At least 4 punctate foci of susceptibility, 1 in the left frontal lobe, 2 in the right parietal lobe, and 1 in the cerebellum. 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 Electronically Signed   By: Marin Roberts M.D.   On: 05/19/2018 12:52   . aspirin  150 mg Rectal Daily  . chlorhexidine  15 mL Mouth Rinse BID  . heparin injection (subcutaneous)  5,000 Units Subcutaneous Q8H  . insulin aspart  0-15 Units Subcutaneous Q4H  . insulin glargine  10 Units Subcutaneous Daily  . mouth rinse  15 mL Mouth Rinse q12n4p  . sodium chloride flush  3 mL Intravenous Q12H  . thiamine injection  100 mg Intravenous Daily    BMET    Component Value Date/Time   NA 149 (H) 05/20/2018 0436   K 2.9 (L) 05/20/2018 0436   CL 107 05/20/2018 0436   CO2 32 05/20/2018 0436   GLUCOSE 105 (H) 05/20/2018 0436   BUN 80 (H) 05/20/2018 0436   CREATININE 2.55 (H) 05/20/2018 0436   CALCIUM 6.8 (L) 05/20/2018 0436   GFRNONAA 24 (L) 05/20/2018 0436   GFRAA 27 (L) 05/20/2018 0436   CBC    Component Value Date/Time   WBC 6.0 05/20/2018 0436   RBC 3.13 (L) 05/20/2018 0436   HGB 9.5 (L) 05/20/2018 0436   HCT 30.3 (L) 05/20/2018 0436   PLT 80 (L) 05/20/2018 0436   MCV 96.8 05/20/2018 0436   MCH 30.4 05/20/2018 0436   MCHC 31.4 05/20/2018 0436   RDW 13.7 05/20/2018 0436   LYMPHSABS 0.3 (L) 05/17/2018 0525   MONOABS 0.2 05/17/2018 0525   EOSABS 0.0 05/17/2018 0525   BASOSABS 0.0 05/17/2018 0525    Assessment/Plan: 1. AKI/CKD stage 3- appears to be in the setting of severe volume depletion and dehydration. 1. Improving withIVF"s  2. renal USwithout hydro but has chronic right nephrolithiasis 3. Nothing further to add.  Will sign off.  Please call with questions or concerns. 2. Metabolic acidosis- due to #1 and metformin. Stoppedmetformin and startedsterile water with 2 amps of  bicarb 1. Resolved with bicarb 2. changed to D5W per Dr. Thedore Mins for possible DKA 3. Lactic acidosis- due to AKI in setting of concomitant metformin. Would not restart metformin.  1. Treat with bicarb gtt as above. 4. Hypernatremia due to dehydration-improving withhypotonic bicarb at an acceptable rate. Continue with hypotonic IVF's and follow. 5. Hypokalemia- Agree to replete IV and follow 6. HTN- stable 7. Moderate protein malnutrition- will need to start protein supplements when able to take po 8. Dementia- per primary svc. 9. DM- hyperglycemic. Start insulin per primary svc 10. Anemia- possibly due to CKD stage 3 but also need to r/o GIB. Increased Hgb from baseline of 9-10 likely due to dehydration and concentration effect. Follow h/h and transfuse prn. 11. FTT- consider palliative care consulted to help set goals/limits of care in an elderly, SNF resident with progressive dementia who is now DNR and will have hospice care at his new SNF when ready for discharge.  Irena CordsJoseph A. Khanh Tanori, MD [facility] [phone]

## 2018-05-20 NOTE — Progress Notes (Signed)
OT Cancellation Note  Patient Details Name: Richard Powers MRN: [id number] DOB: 10/04/1945   Cancelled Treatment:    Reason Eval/Treat Not Completed: Active bedrest order, will await updated activity orders for completion of OT eval. Will follow.   Marcy SirenBreanna Kele Withem, OT [facility] Pager [phone] Office [phone]   Orlando PennerBreanna L Tiffnay Bossi 05/20/2018, 2:30 PM

## 2018-05-20 NOTE — Progress Notes (Signed)
@IPLOG @        PROGRESS NOTE                                                                                                                                                                                                             Patient Demographics:    Richard DeutscherWilliam Powers, is a 72 y.o. male, DOB - December 04, 1945, ZOX:[id number]  Admit date - 05/17/2018   Admitting Physician Jonah BlueJennifer Yates, MD  Outpatient Primary MD for the patient is [name], [name], MD  LOS - 3  Chief Complaint  Patient presents with  . Altered Mental Status       Brief Narrative   Richard Powers is a 72 y.o. male with medical history significant of HTN; HLD; DM; CKD; and dementia presenting with AMS started apparently 2 days ago at SNF, in the ER he was found to be severely dehydrated, and acute renal failure was obtunded, head CT chest x-ray and renal ultrasound were nonacute and he was admitted for further treatment.   Subjective:    Richard Powers today is in bed, he still appears to be in no distress but overall still obtunded, at times will mumble a few words to loud verbal commands.   Assessment  & Plan :     1.  Severe dehydration with hyperkalemia, hypernatremia, acute renal failure along with wide anion gap metabolic acidosis likely due to combination of DKA along with lactic acidosis caused by metformin.    His metabolic acidosis and anion gap have resolved, DKA seems to have resolved, insulin drip has been stopped, hyponatremia and dehydration have improved significantly and will continue gentle D5W drip for another 24 hours and monitor.  2.  Severe metabolic encephalopathy.  Due to #1 above.  CT head and MRI brain nonacute.  Supportive care.  Now n.p.o.  Speech following.  Tenuous supportive care and aspirin rectal suppository for now.  3.  Hypokalemia.  Replaced IV and will monitor.  4.  Underlying Mild-Moderate dementia.  For now supportive care, at risk for delirium.  Palliative care on  board and they had detailed discussion with patient's niece on 05/19/2018.  Family wishes for him to be discharged to SNF with hospice following if he improves, if he does not improve then likely residential hospice.  5.  Hypertension.  Currently n.p.o. due to encephalopathy.  PRN IV hydralazine.    Family Communication  : updated niece on 05/19/2018 at 9:25 AM who lives locally and primary decision maker on his poor prognosis, DNR status  and the plan to treat with medications, if not better transition to comfort measures.  Code Status :  Full, question baseline quality of care, question long-term prognosis.  Will involve palliative care as well.  Disposition Plan  : MedSurg  Consults  :  Renal  Procedures  :    CT head, renal ultrasound and chest x-ray.  All nonacute.  DVT Prophylaxis  :   Heparin   Lab Results  Component Value Date   PLT 80 (L) 05/20/2018    Diet :  Diet Order            Diet NPO time specified  Diet effective now               Inpatient Medications Scheduled Meds: . aspirin  150 mg Rectal Daily  . chlorhexidine  15 mL Mouth Rinse BID  . heparin injection (subcutaneous)  5,000 Units Subcutaneous Q8H  . insulin aspart  0-15 Units Subcutaneous Q4H  . insulin glargine  10 Units Subcutaneous Daily  . mouth rinse  15 mL Mouth Rinse q12n4p  . sodium chloride flush  3 mL Intravenous Q12H  . thiamine injection  100 mg Intravenous Daily   Continuous Infusions: . dextrose    . potassium chloride 10 mEq (05/20/18 0926)   PRN Meds:.[DISCONTINUED] acetaminophen **OR** acetaminophen, bisacodyl, metoprolol tartrate, ondansetron **OR** ondansetron (ZOFRAN) IV, polyvinyl alcohol, sodium phosphate  Antibiotics  :   Anti-infectives (From admission, onward)   Start     Dose/Rate Route Frequency Ordered Stop   05/17/18 0715  cefTRIAXone (ROCEPHIN) 1 g in sodium chloride 0.9 % 100 mL IVPB     1 g 200 mL/hr over 30 Minutes Intravenous  Once 05/17/18 0703 05/17/18  0836          Objective:   Vitals:   05/19/18 1659 05/19/18 2014 05/20/18 0002 05/20/18 0358  BP: (!) 140/96 (!) 152/84 140/81 136/86  Pulse:  78 (!) 108 78  Resp: 11 12 (!) 9 (!) 21  Temp:  98 F (36.7 C) 97.8 F (36.6 C) 98.1 F (36.7 C)  TempSrc:  Axillary Axillary Axillary  SpO2:  100% 100% 100%  Weight:      Height:        Wt Readings from Last 3 Encounters:  05/18/18 45.4 kg  02/21/18 65.9 kg  11/24/17 75.9 kg     Intake/Output Summary (Last 24 hours) at 05/20/2018 0930 Last data filed at 05/20/2018 0534 Gross per 24 hour  Intake 1126.99 ml  Output 900 ml  Net 226.99 ml     Physical Exam   Somnolent, unable to follow commands, however slightly more responsive to painful stimuli today than before but still no meaningful response, does withdraw to painful stimuli in all 4 extremities  [place].AT,PERRAL Supple Neck,No JVD, No cervical lymphadenopathy appriciated.  Symmetrical Chest wall movement, Good air movement bilaterally, CTAB RRR,No Gallops, Rubs or new Murmurs, No Parasternal Heave +ve B.Sounds, Abd Soft, No tenderness, No organomegaly appriciated, No rebound - guarding or rigidity. No Cyanosis, Clubbing or edema, No new Rash or bruise    Data Review:    CBC Recent Labs  Lab 05/17/18 0525  05/17/18 1503 05/18/18 0938 05/19/18 0500 05/19/18 0748 05/20/18 0436  WBC 4.9   < > 5.8 7.7 5.3 6.1 6.0  HGB 6.8*   < > 12.9* 11.6* 9.5* 11.0* 9.5*  HCT 24.8*   < > 44.3 37.8* 30.4* 35.0* 30.3*  PLT 64*   < > 122* PLATELET CLUMPS NOTED  ON SMEAR, UNABLE TO ESTIMATE 75* 91* 80*  MCV 114.3*   < > 105.0* 98.2 97.1 96.2 96.8  MCH 31.3   < > 30.6 30.1 30.4 30.2 30.4  MCHC 27.4*   < > 29.1* 30.7 31.3 31.4 31.4  RDW 14.3   < > 14.2 14.2 14.1 13.9 13.7  LYMPHSABS 0.3*  --   --   --   --   --   --   MONOABS 0.2  --   --   --   --   --   --   EOSABS 0.0  --   --   --   --   --   --   BASOSABS 0.0  --   --   --   --   --   --    < > = values in this interval not  displayed.    Chemistries  Recent Labs  Lab 05/17/18 0525 05/17/18 0807  05/18/18 0011 05/18/18 0643 05/18/18 1754 05/19/18 0748 05/20/18 0436  NA  --  168*   < > 165* 160* 157* 158* 149*  K  --  6.1*   < > 4.6 4.5 4.1 3.0* 2.9*  CL  --  129*   < > 126* 120* 114* 110 107  CO2  --  13*   < > 15* 19* 26 33* 32  GLUCOSE  --  435*   < > 293* 202* 136* 140* 105*  BUN  --  166*   < > 155* 149* 135* 112* 80*  CREATININE  --  6.50*   < > 5.57* 5.05* 4.43* 3.36* 2.55*  CALCIUM  --  8.2*   < > 8.0* 7.6* 7.3* 7.3* 6.8*  MG 1.2*  --   --   --   --   --  3.4* 2.7*  AST  --  20  --   --  20  --   --  25  ALT  --  38  --   --  29  --   --  20  ALKPHOS  --  137*  --   --  112  --   --  89  BILITOT  --  2.3*  --   --  1.5*  --   --  0.6   < > = values in this interval not displayed.   ------------------------------------------------------------------------------------------------------------------ No results for input(s): CHOL, HDL, LDLCALC, TRIG, CHOLHDL, LDLDIRECT in the last 72 hours.  Lab Results  Component Value Date   HGBA1C 7.5 (H) 05/17/2018   ------------------------------------------------------------------------------------------------------------------ No results for input(s): TSH, T4TOTAL, T3FREE, THYROIDAB in the last 72 hours.  Invalid input(s): FREET3 ------------------------------------------------------------------------------------------------------------------ No results for input(s): VITAMINB12, FOLATE, FERRITIN, TIBC, IRON, RETICCTPCT in the last 72 hours.  Coagulation profile No results for input(s): INR, PROTIME in the last 168 hours.  No results for input(s): DDIMER in the last 72 hours.  Cardiac Enzymes No results for input(s): CKMB, TROPONINI, MYOGLOBIN in the last 168 hours.  Invalid input(s): CK ------------------------------------------------------------------------------------------------------------------ No results found for: BNP  Micro  Results Recent Results (from the past 240 hour(s))  Urine culture     Status: None   Collection Time: 05/17/18  5:36 AM  Result Value Ref Range Status   Specimen Description URINE, RANDOM  Final   Special Requests NONE  Final   Culture   Final    NO GROWTH Performed at [facility], [address], [place], [place] [address]  Report Status 05/18/2018 FINAL  Final  Culture, blood (Routine X 2) w Reflex to ID Panel     Status: None (Preliminary result)   Collection Time: 05/17/18  9:40 AM  Result Value Ref Range Status   Specimen Description BLOOD RIGHT WRIST  Final   Special Requests   Final    BOTTLES DRAWN AEROBIC ONLY Blood Culture results may not be optimal due to an inadequate volume of blood received in culture bottles   Culture   Final    NO GROWTH 3 DAYS Performed at [facility], [address], [place], [place] [address]    Report Status PENDING  Incomplete  Culture, blood (Routine X 2) w Reflex to ID Panel     Status: None (Preliminary result)   Collection Time: 05/17/18  9:45 AM  Result Value Ref Range Status   Specimen Description BLOOD RIGHT HAND  Final   Special Requests   Final    BOTTLES DRAWN AEROBIC AND ANAEROBIC Blood Culture results may not be optimal due to an inadequate volume of blood received in culture bottles   Culture   Final    NO GROWTH 3 DAYS Performed at [facility], [address] [place], [place] [address]    Report Status PENDING  Incomplete  MRSA PCR Screening     Status: None   Collection Time: 05/17/18  4:24 PM  Result Value Ref Range Status   MRSA by PCR NEGATIVE NEGATIVE Final    Comment:        The GeneXpert MRSA Assay (FDA approved for NASAL specimens only), is one component of a comprehensive MRSA colonization surveillance program. It is not intended to diagnose MRSA infection nor to guide or monitor treatment for MRSA infections. Performed at [facility], [address] [place], [place]  [address]     Radiology Reports Ct Head Wo Contrast  Result Date: 05/17/2018 CLINICAL DATA:  Unresponsive for 2 days. Unable to obtain a blood pressure. Right-sided facial droop. EXAM: CT HEAD WITHOUT CONTRAST TECHNIQUE: Contiguous axial images were obtained from the base of the skull through the vertex without intravenous contrast. COMPARISON:  02/20/2018 FINDINGS: Brain: Diffuse cerebral atrophy. Ventricular dilatation consistent with central atrophy. Cavum septum pellucidum. Low-attenuation changes throughout the deep white matter consistent with small vessel ischemic changes. No mass-effect or midline shift. No abnormal extra-axial fluid collections. Gray-white matter junctions are distinct. Basal cisterns are not effaced. No acute intracranial hemorrhage. Vascular: Mild intracranial arterial calcifications are present. Skull: Calvarium appears intact. Sinuses/Orbits: Paranasal sinuses and mastoid air cells are clear. Other: None. IMPRESSION: No acute intracranial abnormalities. Chronic atrophy and small vessel ischemic changes. Electronically Signed   By: Burman Nieves M.D.   On: 05/17/2018 06:09   Mr Brain Wo Contrast  Result Date: 05/19/2018 CLINICAL DATA:  Cerebral hemorrhage suspected. Altered mental status beginning 2 days ago at skilled nurse facility. EXAM: MRI HEAD WITHOUT CONTRAST TECHNIQUE: Multiplanar, multiecho pulse sequences of the brain and surrounding structures were obtained without intravenous contrast. COMPARISON:  None. FINDINGS: Brain: Moderate atrophy and white matter changes are present bilaterally. No acute hemorrhage is present. Dilated perivascular spaces are evident. 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 White matter changes extend into the brainstem. Susceptibility weighted imaging demonstrates 4 punctate foci susceptibility, 1 in the left frontal lobe, 1 in the right cerebellum, and 2 in the right parietal lobe. A cavum septum pellucidum is noted.  Ventricles are otherwise proportionate to  the degree of atrophy. Brainstem and cerebellum are within normal limits. Vascular: Flow is present in the major intracranial arteries Skull and upper cervical spine: The skull base is within normal limits. Craniocervical junction is normal. Sinuses/Orbits: The paranasal sinuses and mastoid air cells are clear. Globes and orbits are within normal limits. IMPRESSION: 1. No acute hemorrhage or infarct. 2. Advanced atrophy and white matter disease. This likely reflects the sequela of chronic microvascular ischemia. 3. At least 4 punctate foci of susceptibility, 1 in the left frontal lobe, 2 in the right parietal lobe, and 1 in the cerebellum. 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 Electronically Signed   By: Marin Roberts M.D.   On: 05/19/2018 12:52   US Renal  Result Date: 05/17/2018 CLINICAL DATA:  Acute renal failure. EXAM: RENAL / URINARY TRACT ULTRASOUND COMPLETE COMPARISON:  Renal ultrasound dated 11/16/2017 and CT scan of the abdomen dated 01/22/2015 FINDINGS: Right Kidney: Length: 10.1 cm. 5 mm stone in the mid right kidney, unchanged since the prior CT scan. No hydronephrosis. Echogenicity is normal. No masses. Left Kidney: Length: 10.3 cm. Echogenicity is normal. No hydronephrosis or mass. Bladder: Appears normal for degree of bladder distention.Prostate gland appears prominent. IMPRESSION: 1. No acute abnormality. 2. Chronic small stone in the mid right kidney. Electronically Signed   By: Francene Boyers M.D.   On: 05/17/2018 14:39   Dg Chest Port 1 View  Result Date: 05/17/2018 CLINICAL DATA:  Initial evaluation for acute unresponsiveness. EXAM: PORTABLE CHEST 1 VIEW COMPARISON:  Prior radiograph from 02/20/2018. FINDINGS: Cardiac and mediastinal silhouettes are stable in size and contour, and remain within normal limits. Aortic atherosclerosis. Lungs hypoinflated. No focal infiltrate, pulmonary edema or pleural  effusion. No pneumothorax. No acute osseus abnormality. IMPRESSION: 1. No active cardiopulmonary disease. 2. Aortic atherosclerosis. Electronically Signed   By: Rise Mu M.D.   On: 05/17/2018 05:43    Time Spent in minutes  30   Susa Raring M.D on 05/20/2018 at 9:30 AM  To page go to [email or web address] - password [facility]

## 2018-05-20 NOTE — Progress Notes (Signed)
Daily Progress Note   Patient Name: Richard Powers       Date: 05/20/2018 DOB: 13-Feb-1946  Age: 72 y.o. MRN#: [id number] Attending Physician: Leroy SeaSingh, [name], MD Primary Care Physician: [name], [name], MD Admit Date: 05/17/2018  Reason for Consultation/Follow-up: Establishing goals of care and Hospice Evaluation  Subjective: Slightly more awake today - opens eyes to voice.  Length of Stay: 3  Current Medications: Scheduled Meds:  . aspirin  150 mg Rectal Daily  . chlorhexidine  15 mL Mouth Rinse BID  . heparin injection (subcutaneous)  5,000 Units Subcutaneous Q8H  . insulin aspart  0-15 Units Subcutaneous Q4H  . insulin glargine  10 Units Subcutaneous Daily  . mouth rinse  15 mL Mouth Rinse q12n4p  . sodium chloride flush  3 mL Intravenous Q12H  . thiamine injection  100 mg Intravenous Daily    Continuous Infusions: . dextrose 75 mL/hr at 05/20/18 0952    PRN Meds: [DISCONTINUED] acetaminophen **OR** acetaminophen, bisacodyl, metoprolol tartrate, ondansetron **OR** ondansetron (ZOFRAN) IV, polyvinyl alcohol, sodium phosphate  Physical Exam         Constitutional: No distress.  HENT:  Head: Normocephalic and atraumatic.  Cardiovascular: Regular rhythm. Tachycardia present.  Pulmonary/Chest: Effort normal and breath sounds normal. No accessory muscle usage. No tachypnea. No respiratory distress.  Abdominal: Soft. Bowel sounds are normal.  Musculoskeletal: He exhibits no edema.  Neurological: He is nonverbal.   Skin: Skin is warm and dry. He is not diaphoretic.   Vital Signs: BP (!) 149/104 (BP Location: Left Arm)   Pulse 100   Temp 98.7 F (37.1 C)   Resp 18   Ht 5' 7.99" (1.727 m)   Wt 45.4 kg   SpO2 100%   BMI 15.22 kg/m  SpO2: SpO2: 100 % O2 Device: O2 Device:  Room Air O2 Flow Rate: O2 Flow Rate (L/min): 2 L/min  Intake/output summary:   Intake/Output Summary (Last 24 hours) at 05/20/2018 1537 Last data filed at 05/20/2018 1534 Gross per 24 hour  Intake 1698.18 ml  Output 1200 ml  Net 498.18 ml   LBM: Last BM Date: 05/18/18 Baseline Weight: Weight: 45.4 kg Most recent weight: Weight: 45.4 kg       Palliative Assessment/Data: PPS 10%    Flowsheet Rows     Most Recent Value  Intake Tab  Referral Department  Hospitalist  Unit at Time of Referral  Intermediate Care Unit  Palliative Care Primary Diagnosis  Other (Comment)  Date Notified  05/18/18  Palliative Care Type  New Palliative care  Reason for referral  Clarify Goals of Care  Date of Admission  05/17/18  Date first seen by Palliative Care  05/18/18  # of days Palliative referral response time  0 Day(s)  # of days IP prior to Palliative referral  1  Clinical Assessment  Palliative Performance Scale Score  10%  Psychosocial & Spiritual Assessment  Palliative Care Outcomes  Patient/Family meeting held?  Yes  Who was at the meeting?  niece, cousin  Palliative Care Outcomes  Clarified goals of care, Provided psychosocial or spiritual support, Changed CPR status, Transitioned to hospice, ACP counseling assistance, Counseled regarding hospice, Provided advance care planning  Patient Active Problem List   Diagnosis Date Noted  . Malnutrition of moderate degree 05/19/2018  . Goals of care, counseling/discussion   . Palliative care by specialist   . Severe dehydration 05/17/2018  . Acute renal failure superimposed on stage 2 chronic kidney disease (HCC) 05/17/2018  . Closed left hip fracture, initial encounter (HCC) 02/20/2018  . CKD (chronic kidney disease) stage 2, GFR 60-89 ml/min 02/20/2018  . Closed left hip fracture (HCC) 02/20/2018  . Dehydration 11/16/2017  . AKI (acute kidney injury) (HCC) 11/16/2017  . Acute encephalopathy 11/16/2017  . Elevated lactic acid  level 11/16/2017  . Glaucoma 10/17/2017  . Dementia 09/15/2017  . Sepsis (HCC) 09/09/2017  . Essential hypertension 09/09/2017  . DM (diabetes mellitus), type 2 with renal complications (HCC) 09/09/2017    Palliative Care Assessment & Plan   HPI: 72 y.o. male  with past medical history of HTN, HLD, CKD, depression, DM, and dementia admitted on 05/17/2018 with altered mental status. Patient resides in SNF. He was found to be severely dehydrated with acute renal failure. Labs revealed hyperkalemia, hyponatremia, and hypomagnesemia. Head CT and renal ultrasound negative for acute complications. Patient evaluated by SLP but did not arouse sufficiently for PO intake and deemed a severe aspiration risk. PMT consulted for GOC.  Assessment: Spoke with Angelique Blonder today. We talked about patient's continued improvement in lab work and slightly more awake. Told her of my concerns that patient will not wake up enough to eat/drink. We discussed continuing care, but if patient's mental status does not improve may be more appropriate for hospice home instead of hospice at facility. She agrees. 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 She is trying to get patient's sister to come visit. Emotional support provided. All questions addressed.   Recommendations/Plan: - family focused on patient being discharged to different SNF - discussed with CSW - request hospice care at facility upon discharge once mental status improves; if patient does not improve family would be agreeable to hospice home - PMT will continue to follow and update family - code status changed to DNR per family wishes  Code Status:  DNR  Prognosis:   Unable to determine - poor prognosis r/t advanced dementia, functional and nutritional decline  Discharge Planning:  Skilled Nursing Facility with Hospice  Vs residential hospice depending on patient's clinical improvement   Care plan was discussed with niece  Angelique Blonder  Thank you for allowing the Palliative Medicine Team to assist in the care of this patient.   Total Time 15 minutes Prolonged Time Billed  no       Greater than 50%  of this time was spent counseling and coordinating care related to the above assessment and plan.  Gerlean Ren, DNP, [facility] Palliative Medicine Team Team Phone # [phone]  Pager [phone]

## 2018-05-20 NOTE — Progress Notes (Signed)
SLP Cancellation Note  Patient Details Name: Richard Powers MRN: [id number] DOB: 1946/06/04   Cancelled treatment:       Reason Eval/Treat Not Completed: Fatigue/lethargy limiting ability to participate; nursing stated he has been lethargic entire day; deferred; ST will continue efforts.   Tressie StalkerPat Hridaan Bouse, M.S.,CCC-SLP 05/20/2018, 4:34 PM

## 2018-05-20 NOTE — Progress Notes (Signed)
PT Cancellation Note  Patient Details Name: Clayton BiblesWilliam Thomas Duda III MRN: [id number] DOB: [birth year]   Cancelled Treatment:    Reason Eval/Treat Not Completed: Active bedrest order. Pt remains with strict bed rest orders in place at this time. PT will hold and await updated activity orders.   Alessandra BevelsJennifer M Gloriana Piltz 05/20/2018, 7:59 AM

## 2018-05-21 LAB — COMPREHENSIVE METABOLIC PANEL
ALBUMIN: 2.1 g/dL — AB (ref 3.5–5.0)
ALT: 19 U/L (ref 0–44)
AST: 25 U/L (ref 15–41)
Alkaline Phosphatase: 86 U/L (ref 38–126)
Anion gap: 8 (ref 5–15)
BUN: 48 mg/dL — AB (ref 8–23)
CHLORIDE: 103 mmol/L (ref 98–111)
CO2: 31 mmol/L (ref 22–32)
Calcium: 6.7 mg/dL — ABNORMAL LOW (ref 8.9–10.3)
Creatinine, Ser: 1.99 mg/dL — ABNORMAL HIGH (ref 0.61–1.24)
GFR calc Af Amer: 37 mL/min — ABNORMAL LOW (ref >60)
GFR calc non Af Amer: 32 mL/min — ABNORMAL LOW (ref >60)
GLUCOSE: 123 mg/dL — AB (ref 70–99)
POTASSIUM: 3.3 mmol/L — AB (ref 3.5–5.1)
SODIUM: 142 mmol/L (ref 135–145)
Total Bilirubin: 0.6 mg/dL (ref 0.3–1.2)
Total Protein: 4.8 g/dL — ABNORMAL LOW (ref 6.5–8.1)

## 2018-05-21 LAB — CBC
HEMATOCRIT: 31.2 % — AB (ref 39.0–52.0)
HEMOGLOBIN: 9.9 g/dL — AB (ref 13.0–17.0)
MCH: 30.7 pg (ref 26.0–34.0)
MCHC: 31.7 g/dL (ref 30.0–36.0)
MCV: 96.6 fL (ref 78.0–100.0)
Platelets: 80 10*3/uL — ABNORMAL LOW (ref 150–400)
RBC: 3.23 MIL/uL — ABNORMAL LOW (ref 4.22–5.81)
RDW: 13.3 % (ref 11.5–15.5)
WBC: 6 10*3/uL (ref 4.0–10.5)

## 2018-05-21 LAB — GLUCOSE, CAPILLARY
GLUCOSE-CAPILLARY: 115 mg/dL — AB (ref 70–99)
GLUCOSE-CAPILLARY: 123 mg/dL — AB (ref 70–99)
GLUCOSE-CAPILLARY: 118 mg/dL — AB (ref 70–99)
Glucose-Capillary: 59 mg/dL — ABNORMAL LOW (ref 70–99)

## 2018-05-21 LAB — MAGNESIUM: Magnesium: 2.2 mg/dL (ref 1.7–2.4)

## 2018-05-21 MED ORDER — DEXTROSE 50 % IV SOLN
INTRAVENOUS | Status: AC
  Administered 2018-05-21: 50 mL
  Filled 2018-05-21: qty 50

## 2018-05-21 MED ORDER — POTASSIUM CHLORIDE 2 MEQ/ML IV SOLN
INTRAVENOUS | Status: DC
  Administered 2018-05-21 – 2018-05-22 (×2): via INTRAVENOUS
  Filled 2018-05-21 (×4): qty 1000

## 2018-05-21 NOTE — Progress Notes (Signed)
PT Cancellation Note  Patient Details Name: Richard Powers MRN: [id number] DOB: 1945-11-21   Cancelled Treatment:    Reason Eval/Treat Not Completed: Patient's level of consciousness. Pt with brief eye opening to stimulation. Will check on at later date to see if more alert. Even then pt may not be appropriate for any therapy intervention due to dementia and other multiple medical issues. Appears family to pursue hospice option.    Angelina OkCary W [facility]ilionMaycok 05/21/2018, 2:52 PM Skip Mayerary Kinsley Nicklaus PT Acute Rehabilitation Services Pager [phone] Office [phone]

## 2018-05-21 NOTE — Evaluation (Signed)
Occupational Therapy Evaluation Patient Details Name: Richard Powers MRN: [id number] DOB: March 20, 1946 Today's Date: 05/21/2018    History of Present Illness Pt is a 72 y.o. male with medical history significant for HTN; HLD; DM; CKD; and dementia presenting with AMS. Per MD note, pt with AKI due to severe dehydration. CXR and CT Head negative   Clinical Impression   Pt admitted with above.  He required total  A to move to EOB and to sit EOB x 5 mins.  With stimuli, he will briefly rouse, open eyes, and yell an expletive, then immediately closed his eyes again.   He is unable to engage in an ADL tasks despite hand over hand assist.   Per chart review, he resided at SNF, was mostly bedbound and dependent in ADLs.   If pt returns to SNF, recommend a trial of OT.  Acute OT will sign off at this time as all further OT needs can be addressed at SNF.      Follow Up Recommendations  SNF    Equipment Recommendations  None recommended by OT    Recommendations for Other Services       Precautions / Restrictions Precautions Precautions: Fall      Mobility Bed Mobility Overal bed mobility: Needs Assistance Bed Mobility: Supine to Sit;Sit to Supine     Supine to sit: Total assist Sit to supine: Total assist   General bed mobility comments: Pt does not attempt to assist   Transfers                 General transfer comment: unable to attempt safely - recommend maxi move     Balance Overall balance assessment: Needs assistance Sitting-balance support: Feet supported Sitting balance-Leahy Scale: Zero Sitting balance - Comments: Pt does not attempt to assist with EOB sitting.  Sat EOB x 5 mins with minimal interaction                                   ADL either performed or assessed with clinical judgement   ADL Overall ADL's : Needs assistance/impaired                                       General ADL Comments: Pt requires total A  for all aspects of ADLs.  he does not engage in any activity despite hand over hand assist      Vision   Additional Comments: Pt keeps eye closed, but will briefly open them when max stimuli provided      Perception     Praxis      Pertinent Vitals/Pain Pain Assessment: Faces Faces Pain Scale: Hurts a little bit Pain Location: generalized with movement  Pain Descriptors / Indicators: Grimacing Pain Intervention(s): Monitored during session     Hand Dominance Right   Extremity/Trunk Assessment     Lower Extremity Assessment Lower Extremity Assessment: RLE deficits/detail;LLE deficits/detail RLE Deficits / Details: Pt does not attempt to actively move UEs  RLE Coordination: decreased fine motor;decreased gross motor LLE Deficits / Details: Pt does not attempt to actively move UEs  LLE Coordination: decreased fine motor;decreased gross motor   Cervical / Trunk Assessment Cervical / Trunk Assessment: Other exceptions Cervical / Trunk Exceptions: keeps trunk flexed with head and neck flexed and rotated to the Rt  Communication Communication Communication: No difficulties   Cognition Arousal/Alertness: Lethargic Behavior During Therapy: Flat affect Overall Cognitive Status: Impaired/Different from baseline                                 General Comments: Pt with h/o advanced dementia.  No family is present to provide info re: pt's baseline.  When pt moved to sitting, he opened eyes briefly and yelled an explative before closing eyes and not responding.  He did this x 5 when stimulation (rocking on EOB and calling his name)   General Comments       Exercises     Shoulder Instructions      Home Living Family/patient expects to be discharged to:: Skilled nursing facility                                 Additional Comments: [facility]       Prior Functioning/Environment Level of Independence: Needs assistance  Gait  / Transfers Assistance Needed: per palliative care consult note, pt spend most of his time in bed, and possibly got out of bed or walked with PT  ADL's / Homemaking Assistance Needed: Per palliative consult note, pt was dependent with ADLs since June Communication / Swallowing Assistance Needed: Per palliative care consult note, pt was minimally interactive  Comments: no family present, however, per review of notes from admission in June 2019, pt required total  A +2 for mobility         OT Problem List: Decreased strength;Decreased activity tolerance;Impaired balance (sitting and/or standing);Impaired vision/perception;Decreased coordination;Decreased cognition;Decreased safety awareness;Decreased knowledge of use of DME or AE;Impaired UE functional use      OT Treatment/Interventions:      OT Goals(Current goals can be found in the care plan section) Acute Rehab OT Goals OT Goal Formulation: All assessment and education complete, DC therapy  OT Frequency:     Barriers to D/C:            Co-evaluation              AM-PAC PT "6 Clicks" Daily Activity     Outcome Measure Help from another person eating meals?: Total Help from another person taking care of personal grooming?: Total Help from another person toileting, which includes using toliet, bedpan, or urinal?: Total Help from another person bathing (including washing, rinsing, drying)?: Total Help from another person to put on and taking off regular upper body clothing?: Total Help from another person to put on and taking off regular lower body clothing?: Total 6 Click Score: 6   End of Session Nurse Communication: Mobility status  Activity Tolerance: Patient limited by lethargy Patient left: in bed;with call bell/phone within reach  OT Visit Diagnosis: Cognitive communication deficit (R41.841)                Time: [id number] OT Time Calculation (min): 15 min Charges:  OT General Charges $OT Visit: 1 Visit OT  Evaluation $OT Eval Moderate Complexity: 1 Mod  Jeani HawkingWendi Giara Mcgaughey, OTR/L Acute Rehabilitation Services Pager [phone] Office [phone]   Jeani HawkingConarpe, Annaleese Guier M 05/21/2018, 5:59 PM

## 2018-05-21 NOTE — Progress Notes (Signed)
Hypoglycemic Event  CBG: 59 at 1606  Treatment: 50 mL D50  Symptoms: None.  Follow-up CBG: Time: CBG Result: 70  Possible Reasons for Event: Patient NPO. Comments/MD notified: N/A     Jon GillsElisa R Jewelene Mairena

## 2018-05-21 NOTE — Progress Notes (Signed)
  Speech Language Pathology Treatment: Dysphagia  Patient Details Name: Richard Powers MRN: [id number] DOB: 12/03/1945 Today's Date: 05/21/2018 Time: [id number] SLP Time Calculation (min) (ACUTE ONLY): 15 min  Assessment / Plan / Recommendation Clinical Impression  ST follow up for PO readiness.  Per nursing patient is more alert today, however, he is still unable to follow any commands and keeps his eyes closed throughout most of session.  He does open them to his name and touch.  Oral care was provided using suction and his mouth appeared to be moist.  Full visual inspection of his oral cavity not possible due to the patient's inability to follow commands.  He was accepting of PO trials being given ice chips and thin liquids via spoon sips.  He was observed to orally manipulate the ice chip but given the spoon bolus of thins he required oral suctioning.  He appeared to be slightly improved today from previous interactions, however, he is still not ready for a PO diet.  Recommend that he remain NPO with alternative means for medications.  ST will follow up next date to assess for PO readiness.   HPI HPI: Pt is a 72 y.o. male with medical history significant for HTN; HLD; DM; CKD; and dementia presenting with AMS. Per MD note, pt with AKI due to severe dehydration. CXR and CT Head negative.       SLP Plan  Continue with current plan of care       Recommendations  Diet recommendations: NPO Medication Administration: Via alternative means                Oral Care Recommendations: Oral care QID Follow up Recommendations: Skilled Nursing facility SLP Visit Diagnosis: Dysphagia, oropharyngeal phase (R13.12) Plan: Continue with current plan of care       GO               Richard AguasMelissa Justyce Baby, MA, CCC-SLP Acute Rehab SLP [phone] Richard ContrasMelissa N Youlanda Powers 05/21/2018, 9:20 AM

## 2018-05-21 NOTE — Progress Notes (Signed)
Daily Progress Note   Patient Name: Richard Powers       Date: 05/21/2018 DOB: [birth year]  Age: 72 y.o. MRN#: [id number] Attending Physician: [name], MD Primary Care Physician: [name], MD Admit Date: 05/17/2018  Reason for Consultation/Follow-up: Establishing goals of care and Hospice Evaluation  Subjective: Slightly more awake today - opens eyes to voice.  Length of Stay: 4  Current Medications: Scheduled Meds:  . aspirin  150 mg Rectal Daily  . chlorhexidine  15 mL Mouth Rinse BID  . heparin injection (subcutaneous)  5,000 Units Subcutaneous Q8H  . insulin aspart  0-15 Units Subcutaneous Q4H  . insulin glargine  10 Units Subcutaneous Daily  . mouth rinse  15 mL Mouth Rinse q12n4p  . sodium chloride flush  3 mL Intravenous Q12H  . thiamine injection  100 mg Intravenous Daily    Continuous Infusions: . dextrose 5 % with kcl 50 mL/hr at 05/21/18 1005    PRN Meds: [DISCONTINUED] acetaminophen **OR** acetaminophen, bisacodyl, metoprolol tartrate, ondansetron **OR** ondansetron (ZOFRAN) IV, polyvinyl alcohol, sodium phosphate  Physical Exam         Constitutional: No distress.  HENT:  Head: Normocephalic and atraumatic.  Cardiovascular: Regular rhythm and rate. Pulmonary/Chest: Effort normal and breath sounds normal. No accessory muscle usage. No tachypnea. No respiratory distress.  Abdominal: Soft. Bowel sounds are normal.  Musculoskeletal: He exhibits no edema.  Neurological: He is nonverbal.   Skin: Skin is warm and dry. He is not diaphoretic.   Vital Signs: BP (!) 156/91 (BP Location: Left Arm)   Pulse 81   Temp 99.2 F (37.3 C) (Oral)   Resp 18   Ht 5' 7.99" (1.727 m)   Wt 45.4 kg   SpO2 100%   BMI 15.22 kg/m  SpO2: SpO2: 100 % O2 Device: O2  Device: Room Air O2 Flow Rate: O2 Flow Rate (L/min): 2 L/min  Intake/output summary:   Intake/Output Summary (Last 24 hours) at 05/21/2018 1025 Last data filed at 05/21/2018 4098 Gross per 24 hour  Intake 730.5 ml  Output 920 ml  Net -189.5 ml   LBM: Last BM Date: 05/18/18 Baseline Weight: Weight: 45.4 kg Most recent weight: Weight: 45.4 kg       Palliative Assessment/Data: PPS 10%    Flowsheet Rows     Most Recent Value  Intake Tab  Referral Department  Hospitalist  Unit at Time of Referral  Intermediate Care Unit  Palliative Care Primary Diagnosis  Other (Comment)  Date Notified  05/18/18  Palliative Care Type  New Palliative care  Reason for referral  Clarify Goals of Care  Date of Admission  05/17/18  Date first seen by Palliative Care  05/18/18  # of days Palliative referral response time  0 Day(s)  # of days IP prior to Palliative referral  1  Clinical Assessment  Palliative Performance Scale Score  10%  Psychosocial & Spiritual Assessment  Palliative Care Outcomes  Patient/Family meeting held?  Yes  Who was at the meeting?  niece, cousin  Palliative Care Outcomes  Clarified goals of care, Provided psychosocial or spiritual support, Changed CPR status, Transitioned to hospice, ACP counseling assistance, Counseled regarding hospice, Provided advance  care planning      Patient Active Problem List   Diagnosis Date Noted  . Malnutrition of moderate degree 05/19/2018  . Goals of care, counseling/discussion   . Palliative care by specialist   . Severe dehydration 05/17/2018  . Acute renal failure superimposed on stage 2 chronic kidney disease (HCC) 05/17/2018  . Closed left hip fracture, initial encounter (HCC) 02/20/2018  . CKD (chronic kidney disease) stage 2, GFR 60-89 ml/min 02/20/2018  . Closed left hip fracture (HCC) 02/20/2018  . Dehydration 11/16/2017  . AKI (acute kidney injury) (HCC) 11/16/2017  . Acute encephalopathy 11/16/2017  . Elevated lactic acid  level 11/16/2017  . Glaucoma 10/17/2017  . Dementia 09/15/2017  . Sepsis (HCC) 09/09/2017  . Essential hypertension 09/09/2017  . DM (diabetes mellitus), type 2 with renal complications (HCC) 09/09/2017    Palliative Care Assessment & Plan   HPI: 72 y.o. male  with past medical history of HTN, HLD, CKD, depression, DM, and dementia admitted on 05/17/2018 with altered mental status. Patient resides in SNF. He was found to be severely dehydrated with acute renal failure. Labs revealed hyperkalemia, hyponatremia, and hypomagnesemia. Head CT and renal ultrasound negative for acute complications. Patient evaluated by SLP but did not arouse sufficiently for PO intake and deemed a severe aspiration risk. PMT consulted for GOC.  Assessment: Patient's mental status continues to improve. Still not at baseline, nonverbal, does not follow commands - will now open eyes to voice. Per speech, he swallowed small amount of ice.  Patient's family (niece, Richard Powers) understands situation. She is hopeful patient improves enough - able to maintain PO intake - to go to SNF with hospice. She does state multiple times she does not want patient to return to former SNF. She is also aware that if is unable to maintain PO intake that hospice facility may be more appropriate for him.  She is hopeful patient's sister will come visit him soon - sister has not been involved in patient's medical care.   Emotional support provided. All questions addressed.   Recommendations/Plan: - family focused on patient being discharged to different SNF - discussed with CSW - request hospice care at facility upon discharge once mental status improves; if patient does not improve enough to maintain PO intake family would be agreeable to hospice home - PMT will continue to follow and update family - code status changed to DNR per family wishes  Code Status:  DNR  Prognosis:   Unable to determine - poor prognosis r/t advanced dementia,  functional and nutritional decline  Discharge Planning:  Skilled Nursing Facility with Hospice  Vs residential hospice depending on patient's clinical improvement   Care plan was discussed with niece Richard Powers  Thank you for allowing the Palliative Medicine Team to assist in the care of this patient.   Total Time 15 minutes Prolonged Time Billed  no       Greater than 50%  of this time was spent counseling and coordinating care related to the above assessment and plan.  Gerlean RenShae Lee Keven Osborn, DNP, [facility] Palliative Medicine Team Team Phone # [phone]  Pager [phone]

## 2018-05-21 NOTE — Progress Notes (Signed)
@IPLOG @        PROGRESS NOTE                                                                                                                                                                                                             Patient Demographics:    Richard Powers, is a 72 y.o. male, DOB - Jul 30, 1946, ZOX:[id number]  Admit date - 05/17/2018   Admitting Physician Jonah Blue, MD  Outpatient Primary MD for the patient is [name], MD  LOS - 4  Chief Complaint  Patient presents with  . Altered Mental Status       Brief Narrative   Richard Powers is a 72 y.o. male with medical history significant of HTN; HLD; DM; CKD; and dementia presenting with AMS started apparently 2 days ago at SNF, in the ER he was found to be severely dehydrated, and acute renal failure was obtunded, head CT chest x-ray and renal ultrasound were nonacute and he was admitted for further treatment.   Subjective:    Richard Powers today is in bed, in no distress unable to follow commands or answer questions appropriately but opens eyes spontaneously now to verbal commands.   Assessment  & Plan :     1.  Severe dehydration with hyperkalemia, hypernatremia, acute renal failure along with wide anion gap metabolic acidosis likely due to combination of DKA along with lactic acidosis caused by metformin.    His metabolic acidosis and anion gap have resolved, DKA seems to have resolved, insulin drip has been stopped, hyponatremia and dehydration have improved significantly and will continue gentle D5W drip for another 24 hours and monitor.  2.  Severe metabolic encephalopathy.  Due to #1 above.  CT head and MRI brain nonacute.  Mild clinical improvement with supportive care, speech therapy on board remains n.p.o. currently, will have speech reevaluate tomorrow, long-term prognosis still appears grim.  3.  Hypokalemia.  Replaced IV again and will continue to monitor, stable magnesium.  4.   Underlying Mild-Moderate dementia.  For now supportive care, at risk for delirium.  Palliative care on board and they had detailed discussion with patient's niece on 05/19/2018.  Family wishes for him to be discharged to SNF with hospice following if he improves, if he does not improve then likely residential hospice.  5.  Hypertension.  Currently n.p.o. due to encephalopathy.  PRN IV hydralazine.    Family Communication  : updated niece on 05/19/2018 at 9:25 AM who lives locally and primary decision maker  on his poor prognosis, DNR status and the plan to treat with medications, if not better transition to comfort measures.  Code Status :  Full, question baseline quality of care, question long-term prognosis.  Will involve palliative care as well.  Disposition Plan  : Likely discharge to SNF with hospice follow-up on Monday or Tuesday  Consults  :  Renal  Procedures  :    CT head, renal ultrasound and chest x-ray.  All nonacute.  DVT Prophylaxis  :   Heparin   Lab Results  Component Value Date   PLT 80 (L) 05/21/2018    Diet :  Diet Order            Diet NPO time specified  Diet effective now               Inpatient Medications Scheduled Meds: . aspirin  150 mg Rectal Daily  . chlorhexidine  15 mL Mouth Rinse BID  . heparin injection (subcutaneous)  5,000 Units Subcutaneous Q8H  . insulin aspart  0-15 Units Subcutaneous Q4H  . insulin glargine  10 Units Subcutaneous Daily  . mouth rinse  15 mL Mouth Rinse q12n4p  . sodium chloride flush  3 mL Intravenous Q12H  . thiamine injection  100 mg Intravenous Daily   Continuous Infusions: . dextrose 5 % with kcl     PRN Meds:.[DISCONTINUED] acetaminophen **OR** acetaminophen, bisacodyl, metoprolol tartrate, ondansetron **OR** ondansetron (ZOFRAN) IV, polyvinyl alcohol, sodium phosphate  Antibiotics  :   Anti-infectives (From admission, onward)   Start     Dose/Rate Route Frequency Ordered Stop   05/17/18 0715  cefTRIAXone  (ROCEPHIN) 1 g in sodium chloride 0.9 % 100 mL IVPB     1 g 200 mL/hr over 30 Minutes Intravenous  Once 05/17/18 0703 05/17/18 0836          Objective:   Vitals:   05/20/18 1249 05/20/18 2008 05/21/18 0027 05/21/18 0424  BP: (!) 149/104 129/85 (!) 146/95 (!) 156/91  Pulse: 100 (!) 55 87 81  Resp: 18     Temp: 98.7 F (37.1 C) 98.8 F (37.1 C) 98.7 F (37.1 C) 99.2 F (37.3 C)  TempSrc:  Oral Oral Oral  SpO2: 100% 100% 100% 100%  Weight:      Height:        Wt Readings from Last 3 Encounters:  05/18/18 45.4 kg  02/21/18 65.9 kg  11/24/17 75.9 kg     Intake/Output Summary (Last 24 hours) at 05/21/2018 0938 Last data filed at 05/21/2018 1610 Gross per 24 hour  Intake 730.5 ml  Output 920 ml  Net -189.5 ml     Physical Exam    Slightly more awake today than before but still unable to follow commands, appears to be in no distress, moves all 4 extremities to painful stimuli [place].AT,PERRAL Supple Neck,No JVD, No cervical lymphadenopathy appriciated.  Symmetrical Chest wall movement, Good air movement bilaterally, CTAB RRR,No Gallops, Rubs or new Murmurs, No Parasternal Heave +ve B.Sounds, Abd Soft, No tenderness, No organomegaly appriciated, No rebound - guarding or rigidity. No Cyanosis, Clubbing or edema, No new Rash or bruise     Data Review:    CBC Recent Labs  Lab 05/17/18 0525  05/18/18 0938 05/19/18 0500 05/19/18 0748 05/20/18 0436 05/21/18 0538  WBC 4.9   < > 7.7 5.3 6.1 6.0 6.0  HGB 6.8*   < > 11.6* 9.5* 11.0* 9.5* 9.9*  HCT 24.8*   < > 37.8* 30.4* 35.0* 30.3* 31.2*  PLT 64*   < > PLATELET CLUMPS NOTED ON SMEAR, UNABLE TO ESTIMATE 75* 91* 80* 80*  MCV 114.3*   < > 98.2 97.1 96.2 96.8 96.6  MCH 31.3   < > 30.1 30.4 30.2 30.4 30.7  MCHC 27.4*   < > 30.7 31.3 31.4 31.4 31.7  RDW 14.3   < > 14.2 14.1 13.9 13.7 13.3  LYMPHSABS 0.3*  --   --   --   --   --   --   MONOABS 0.2  --   --   --   --   --   --   EOSABS 0.0  --   --   --   --   --   --    BASOSABS 0.0  --   --   --   --   --   --    < > = values in this interval not displayed.    Chemistries  Recent Labs  Lab 05/17/18 0525 05/17/18 0807  05/18/18 0643 05/18/18 1754 05/19/18 0748 05/20/18 0436 05/21/18 0538  NA  --  168*   < > 160* 157* 158* 149* 142  K  --  6.1*   < > 4.5 4.1 3.0* 2.9* 3.3*  CL  --  129*   < > 120* 114* 110 107 103  CO2  --  13*   < > 19* 26 33* 32 31  GLUCOSE  --  435*   < > 202* 136* 140* 105* 123*  BUN  --  166*   < > 149* 135* 112* 80* 48*  CREATININE  --  6.50*   < > 5.05* 4.43* 3.36* 2.55* 1.99*  CALCIUM  --  8.2*   < > 7.6* 7.3* 7.3* 6.8* 6.7*  MG 1.2*  --   --   --   --  3.4* 2.7* 2.2  AST  --  20  --  20  --   --  25 25  ALT  --  38  --  29  --   --  20 19  ALKPHOS  --  137*  --  112  --   --  89 86  BILITOT  --  2.3*  --  1.5*  --   --  0.6 0.6   < > = values in this interval not displayed.   ------------------------------------------------------------------------------------------------------------------ No results for input(s): CHOL, HDL, LDLCALC, TRIG, CHOLHDL, LDLDIRECT in the last 72 hours.  Lab Results  Component Value Date   HGBA1C 7.5 (H) 05/17/2018   ------------------------------------------------------------------------------------------------------------------ No results for input(s): TSH, T4TOTAL, T3FREE, THYROIDAB in the last 72 hours.  Invalid input(s): FREET3 ------------------------------------------------------------------------------------------------------------------ No results for input(s): VITAMINB12, FOLATE, FERRITIN, TIBC, IRON, RETICCTPCT in the last 72 hours.  Coagulation profile No results for input(s): INR, PROTIME in the last 168 hours.  No results for input(s): DDIMER in the last 72 hours.  Cardiac Enzymes No results for input(s): CKMB, TROPONINI, MYOGLOBIN in the last 168 hours.  Invalid input(s):  CK ------------------------------------------------------------------------------------------------------------------ No results found for: BNP  Micro Results Recent Results (from the past 240 hour(s))  Urine culture     Status: None   Collection Time: 05/17/18  5:36 AM  Result Value Ref Range Status   Specimen Description URINE, RANDOM  Final   Special Requests NONE  Final   Culture   Final    NO GROWTH Performed at [facility], [address], [place], [place] [address]  Report Status 05/18/2018 FINAL  Final  Culture, blood (Routine X 2) w Reflex to ID Panel     Status: None (Preliminary result)   Collection Time: 05/17/18  9:40 AM  Result Value Ref Range Status   Specimen Description BLOOD RIGHT WRIST  Final   Special Requests   Final    BOTTLES DRAWN AEROBIC ONLY Blood Culture results may not be optimal due to an inadequate volume of blood received in culture bottles   Culture   Final    NO GROWTH 3 DAYS Performed at [facility], [address] [place], [place] [address]    Report Status PENDING  Incomplete  Culture, blood (Routine X 2) w Reflex to ID Panel     Status: None (Preliminary result)   Collection Time: 05/17/18  9:45 AM  Result Value Ref Range Status   Specimen Description BLOOD RIGHT HAND  Final   Special Requests   Final    BOTTLES DRAWN AEROBIC AND ANAEROBIC Blood Culture results may not be optimal due to an inadequate volume of blood received in culture bottles   Culture   Final    NO GROWTH 3 DAYS Performed at [facility], [address] [place], [place] [address]    Report Status PENDING  Incomplete  MRSA PCR Screening     Status: None   Collection Time: 05/17/18  4:24 PM  Result Value Ref Range Status   MRSA by PCR NEGATIVE NEGATIVE Final    Comment:        The GeneXpert MRSA Assay (FDA approved for NASAL specimens only), is one component of a comprehensive MRSA colonization surveillance program. It is not intended to  diagnose MRSA infection nor to guide or monitor treatment for MRSA infections. Performed at [facility], [address] [place], [place] [address]     Radiology Reports Ct Head Wo Contrast  Result Date: 05/17/2018 CLINICAL DATA:  Unresponsive for 2 days. Unable to obtain a blood pressure. Right-sided facial droop. EXAM: CT HEAD WITHOUT CONTRAST TECHNIQUE: Contiguous axial images were obtained from the base of the skull through the vertex without intravenous contrast. COMPARISON:  02/20/2018 FINDINGS: Brain: Diffuse cerebral atrophy. Ventricular dilatation consistent with central atrophy. Cavum septum pellucidum. Low-attenuation changes throughout the deep white matter consistent with small vessel ischemic changes. No mass-effect or midline shift. No abnormal extra-axial fluid collections. Gray-white matter junctions are distinct. Basal cisterns are not effaced. No acute intracranial hemorrhage. Vascular: Mild intracranial arterial calcifications are present. Skull: Calvarium appears intact. Sinuses/Orbits: Paranasal sinuses and mastoid air cells are clear. Other: None. IMPRESSION: No acute intracranial abnormalities. Chronic atrophy and small vessel ischemic changes. Electronically Signed   By: Burman NievesWilliam  Stevens M.D.   On: 05/17/2018 06:09   Mr Brain Wo Contrast  Result Date: 05/19/2018 CLINICAL DATA:  Cerebral hemorrhage suspected. Altered mental status beginning 2 days ago at skilled nurse facility. EXAM: MRI HEAD WITHOUT CONTRAST TECHNIQUE: Multiplanar, multiecho pulse sequences of the brain and surrounding structures were obtained without intravenous contrast. COMPARISON:  None. FINDINGS: Brain: Moderate atrophy and white matter changes are present bilaterally. No acute hemorrhage is present. Dilated perivascular spaces are evident. 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 White matter changes extend into the brainstem. Susceptibility weighted imaging demonstrates 4  punctate foci susceptibility, 1 in the left frontal lobe, 1 in the right cerebellum, and 2 in the right parietal lobe. A cavum septum pellucidum is noted. Ventricles are otherwise proportionate to  the degree of atrophy. Brainstem and cerebellum are within normal limits. Vascular: Flow is present in the major intracranial arteries Skull and upper cervical spine: The skull base is within normal limits. Craniocervical junction is normal. Sinuses/Orbits: The paranasal sinuses and mastoid air cells are clear. Globes and orbits are within normal limits. IMPRESSION: 1. No acute hemorrhage or infarct. 2. Advanced atrophy and white matter disease. This likely reflects the sequela of chronic microvascular ischemia. 3. At least 4 punctate foci of susceptibility, 1 in the left frontal lobe, 2 in the right parietal lobe, and 1 in the cerebellum. 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 Electronically Signed   By: Marin Roberts M.D.   On: 05/19/2018 12:52   US Renal  Result Date: 05/17/2018 CLINICAL DATA:  Acute renal failure. EXAM: RENAL / URINARY TRACT ULTRASOUND COMPLETE COMPARISON:  Renal ultrasound dated 11/16/2017 and CT scan of the abdomen dated 01/22/2015 FINDINGS: Right Kidney: Length: 10.1 cm. 5 mm stone in the mid right kidney, unchanged since the prior CT scan. No hydronephrosis. Echogenicity is normal. No masses. Left Kidney: Length: 10.3 cm. Echogenicity is normal. No hydronephrosis or mass. Bladder: Appears normal for degree of bladder distention.Prostate gland appears prominent. IMPRESSION: 1. No acute abnormality. 2. Chronic small stone in the mid right kidney. Electronically Signed   By: Francene Boyers M.D.   On: 05/17/2018 14:39   Dg Chest Port 1 View  Result Date: 05/17/2018 CLINICAL DATA:  Initial evaluation for acute unresponsiveness. EXAM: PORTABLE CHEST 1 VIEW COMPARISON:  Prior radiograph from 02/20/2018. FINDINGS: Cardiac and mediastinal silhouettes are  stable in size and contour, and remain within normal limits. Aortic atherosclerosis. Lungs hypoinflated. No focal infiltrate, pulmonary edema or pleural effusion. No pneumothorax. No acute osseus abnormality. IMPRESSION: 1. No active cardiopulmonary disease. 2. Aortic atherosclerosis. Electronically Signed   By: Rise Mu M.D.   On: 05/17/2018 05:43    Time Spent in minutes  30   Susa Raring M.D on 05/21/2018 at 9:38 AM  To page go to [email or web address] - password [email or web address]

## 2018-05-22 ENCOUNTER — Other Ambulatory Visit: Payer: Self-pay

## 2018-05-22 LAB — GLUCOSE, CAPILLARY
GLUCOSE-CAPILLARY: 110 mg/dL — AB (ref 70–99)
GLUCOSE-CAPILLARY: 113 mg/dL — AB (ref 70–99)
GLUCOSE-CAPILLARY: 138 mg/dL — AB (ref 70–99)
GLUCOSE-CAPILLARY: 61 mg/dL — AB (ref 70–99)
Glucose-Capillary: 100 mg/dL — ABNORMAL HIGH (ref 70–99)
Glucose-Capillary: 114 mg/dL — ABNORMAL HIGH (ref 70–99)
Glucose-Capillary: 114 mg/dL — ABNORMAL HIGH (ref 70–99)
Glucose-Capillary: 116 mg/dL — ABNORMAL HIGH (ref 70–99)
Glucose-Capillary: 150 mg/dL — ABNORMAL HIGH (ref 70–99)
Glucose-Capillary: 70 mg/dL (ref 70–99)
Glucose-Capillary: 72 mg/dL (ref 70–99)
Glucose-Capillary: 89 mg/dL (ref 70–99)
Glucose-Capillary: 99 mg/dL (ref 70–99)

## 2018-05-22 LAB — CBC
HEMATOCRIT: 30.6 % — AB (ref 39.0–52.0)
HEMOGLOBIN: 10 g/dL — AB (ref 13.0–17.0)
MCH: 30.8 pg (ref 26.0–34.0)
MCHC: 32.7 g/dL (ref 30.0–36.0)
MCV: 94.2 fL (ref 78.0–100.0)
Platelets: 104 10*3/uL — ABNORMAL LOW (ref 150–400)
RBC: 3.25 MIL/uL — AB (ref 4.22–5.81)
RDW: 13.2 % (ref 11.5–15.5)
WBC: 5.8 10*3/uL (ref 4.0–10.5)

## 2018-05-22 LAB — COMPREHENSIVE METABOLIC PANEL
ALT: 19 U/L (ref 0–44)
ANION GAP: 9 (ref 5–15)
AST: 25 U/L (ref 15–41)
Albumin: 2.1 g/dL — ABNORMAL LOW (ref 3.5–5.0)
Alkaline Phosphatase: 86 U/L (ref 38–126)
BILIRUBIN TOTAL: 0.6 mg/dL (ref 0.3–1.2)
BUN: 33 mg/dL — ABNORMAL HIGH (ref 8–23)
CO2: 28 mmol/L (ref 22–32)
Calcium: 6.9 mg/dL — ABNORMAL LOW (ref 8.9–10.3)
Chloride: 104 mmol/L (ref 98–111)
Creatinine, Ser: 1.74 mg/dL — ABNORMAL HIGH (ref 0.61–1.24)
GFR, EST AFRICAN AMERICAN: 43 mL/min — AB (ref >60)
GFR, EST NON AFRICAN AMERICAN: 37 mL/min — AB (ref >60)
Glucose, Bld: 107 mg/dL — ABNORMAL HIGH (ref 70–99)
Potassium: 3.5 mmol/L (ref 3.5–5.1)
Sodium: 141 mmol/L (ref 135–145)
TOTAL PROTEIN: 4.8 g/dL — AB (ref 6.5–8.1)

## 2018-05-22 LAB — CULTURE, BLOOD (ROUTINE X 2)
Culture: NO GROWTH
Culture: NO GROWTH

## 2018-05-22 MED ORDER — LORAZEPAM 2 MG/ML PO CONC: 1.0000 mg | Freq: Four times a day (QID) | ORAL | 0 refills | Status: AC | PRN

## 2018-05-22 MED ORDER — BISACODYL 10 MG RE SUPP: 10.0000 mg | Freq: Every day | RECTAL | 0 refills | Status: AC | PRN

## 2018-05-22 MED ORDER — DEXTROSE 50 % IV SOLN
50.0000 mL | Freq: Once | INTRAVENOUS | Status: AC
  Administered 2018-05-22: 50 mL via INTRAVENOUS

## 2018-05-22 MED ORDER — DEXTROSE 5 % IV SOLN
INTRAVENOUS | Status: DC
  Administered 2018-05-22: 21:00:00 via INTRAVENOUS

## 2018-05-22 MED ORDER — MORPHINE SULFATE (CONCENTRATE) 10 MG/0.5ML PO SOLN: 10.0000 mg | ORAL | 0 refills | Status: AC | PRN

## 2018-05-22 MED ORDER — DEXTROSE 50 % IV SOLN
INTRAVENOUS | Status: AC
  Filled 2018-05-22: qty 50

## 2018-05-22 NOTE — Progress Notes (Signed)
CBG came up to 89 after Dextrose 50% - 50 ml. Will continue to monitor.

## 2018-05-22 NOTE — Progress Notes (Addendum)
Physical Therapy Evaluation  Patient Details Name: Richard Powers MRN: [id number] DOB: 1945/09/24 Today's Date: 05/22/2018    History of Present Illness Pt is a 72 y.o. male with medical history significant for HTN; HLD; DM; CKD; and dementia presenting with AMS. Per MD note, pt with AKI due to severe dehydration. CXR and CT Head negative    PT Comments    Patient received in bed, awake and alert with eyes open and tracking therapist in room, but with patient not responding verbally to PT, not following simple cues or even attempting to do so. Attempted functional bed mobility, however patient became quite agitated and began screaming "no" and expressive profanities at PT/tech. Unable to complete functional bed mobility due to agitation/non-participation of patient today/safety concerns for therapy staff.  Changed his gown due his original one being soiled, and left him in bed with all needs met, bed alarm activated. Per chart review, patient may discharge to SNF or hospice soon. PT signing off at this point due to multiple episodes of not being able to work with patient due to lethargy or agitation. Please reorder PT if new consult is needed moving forward.   Follow Up Recommendations  SNF;Other (comment)(SNF vs hospice )     Equipment Recommendations  None recommended by PT    Recommendations for Other Services       Precautions / Restrictions Precautions Precautions: Fall Restrictions Weight Bearing Restrictions: No    Mobility  Bed Mobility               General bed mobility comments: attempted, patient verbally and non-verbally resisting   Transfers                 General transfer comment: unable to attempt safely   Ambulation/Gait             General Gait Details: unable to attempt safely    Stairs             Wheelchair Mobility    Modified Rankin (Stroke Patients Only)       Balance Overall balance assessment: Needs  assistance Sitting-balance support: Feet supported Sitting balance-Leahy Scale: Zero       Standing balance-Leahy Scale: Zero                              Cognition Arousal/Alertness: Lethargic Behavior During Therapy: Flat affect;Agitated Overall Cognitive Status: Impaired/Different from baseline                                 General Comments: patient agitated throughout session, screaming expletives at PT and tech throughout       Exercises      General Comments        Pertinent Vitals/Pain Pain Assessment: Faces Faces Pain Scale: Hurts a little bit Pain Location: generalized with movement  Pain Descriptors / Indicators: Grimacing Pain Intervention(s): Monitored during session;Limited activity within patient's tolerance    Home Living                      Prior Function            PT Goals (current goals can now be found in the care plan section) Progress towards PT goals: Not progressing toward goals - comment(limited by lethargy and agitation )    Frequency    Min 2X/week  PT Plan Current plan remains appropriate    Co-evaluation              AM-PAC PT "6 Clicks" Daily Activity  Outcome Measure  Difficulty turning over in bed (including adjusting bedclothes, sheets and blankets)?: Unable Difficulty moving from lying on back to sitting on the side of the bed? : Unable Difficulty sitting down on and standing up from a chair with arms (e.g., wheelchair, bedside commode, etc,.)?: Unable Help needed moving to and from a bed to chair (including a wheelchair)?: Total Help needed walking in hospital room?: Total Help needed climbing 3-5 steps with a railing? : Total 6 Click Score: 6    End of Session   Activity Tolerance: Treatment limited secondary to agitation Patient left: in bed;with bed alarm set;with call bell/phone within reach   PT Visit Diagnosis: Unsteadiness on feet (R26.81);Muscle weakness  (generalized) (M62.81);Other abnormalities of gait and mobility (R26.89)     Time: 9381-0175 PT Time Calculation (min) (ACUTE ONLY): 12 min  Charges:  $Therapeutic Activity: 8-22 mins                     Deniece Ree PT, DPT, CBIS  Supplemental Physical Therapist [place]    Pager [phone] Acute Rehab Office [phone]

## 2018-05-22 NOTE — Progress Notes (Signed)
2000 CBG 72; received order for d5 @ 7150mL/hr as pt is NPO. Mouth care performed this pm. Will continue to monitor.  Margarito LinerStephanie M Cypress Fanfan, RN

## 2018-05-22 NOTE — Discharge Instructions (Signed)
Follow with Primary MD [name], [name], MD in 7 days   Get CBC, CMP, 2 view Chest X ray checked  by Primary MD or SNF MD in 5-7 days    Activity: As tolerated with Full fall precautions use walker/cane & assistance as needed  Disposition Residential Hospice or SNF with Hospice  Diet: NPO,  speech re-eval in 2-3 days    Special Instructions: If you have smoked or chewed Tobacco  in the last 2 yrs please stop smoking, stop any regular Alcohol  and or any Recreational drug use.  On your next visit with your primary care physician please Get Medicines reviewed and adjusted.  Please request your Prim.MD to go over all Hospital Tests and Procedure/Radiological results at the follow up, please get all Hospital records sent to your Prim MD by signing hospital release before you go home.  If you experience worsening of your admission symptoms, develop shortness of breath, life threatening emergency, suicidal or homicidal thoughts you must seek medical attention immediately by calling 911 or calling your MD immediately  if symptoms less severe.

## 2018-05-22 NOTE — Progress Notes (Signed)
CSW left another voicemail for patient's niece to discuss discharge to SNF versus hospice.   Osborne Cascoadia Jourdain Guay LCSW [phone]

## 2018-05-22 NOTE — Progress Notes (Signed)
Patient's CBG was 61. He received Dextrose 50% IV 50 ml per SO order patient being unable to have anything by mouth. MD notified. Will continue to monitor.

## 2018-05-22 NOTE — Progress Notes (Signed)
NCM called pt's niece, [name] @ [phone] and sister, Darl PikesSusan @ [phone] to discuss d/c plan , however both calls unsuccessful. NCM received no answer and was unable to leave voice message. Gae GallopAngela Ritisha Deitrick RN, BSN,CM

## 2018-05-22 NOTE — Discharge Summary (Addendum)
Richard Powers ZOX:[id number] DOB: [birth year] DOA: 05/17/2018  PCP: [name], MD  Admit date: 05/17/2018  Discharge date: 05/23/2018  Admitted From: SNF   Disposition: Residential hospice versus SNF with hospice   Recommendations for Outpatient Follow-up:   Follow up with PCP in 1-2 weeks  PCP Please obtain BMP/CBC, 2 view CXR in 1week,  (see Discharge instructions)   PCP Please follow up on the following pending results:     Home Health: None  Equipment/Devices: None  Consultations: Pall.Care, Renal Discharge Condition: Guarded   CODE STATUS: DNR   Diet Recommendation: N.p.o. for now, if mentation improves speech therapy evaluation can be done and this can be readdressed.  Diet Order            Diet NPO time specified  Diet effective now               Chief Complaint  Patient presents with  . Altered Mental Status     Brief history of present illness from the day of admission and additional interim summary    Richard Moan IIIis a 72 y.o.malewith medical history significant ofHTN; HLD; DM; CKD; and dementiapresenting with AMS started apparently 2 days ago at [facility], in the ER he was found to be severely dehydrated, and acute renal failure was obtunded, head CT chest x-ray and renal ultrasound were nonacute and he was admitted for further treatment.                                                                  Hospital Course   Addendum.  Patient was discharged yesterday with plan of care being comfort care and hospice, awaiting appropriate bed, minimal to no change in mentation still n.p.o., prognosis still guarded.  Goal of care still comfort.  No change in the plan.  Only comfort medications on at this time.    1.  Severe dehydration with hyperkalemia, hypernatremia,  acute renal failure along with wide anion gap metabolic acidosis likely due to combination of DKA along with lactic acidosis caused by metformin.    His metabolic acidosis and anion gap have resolved, DKA and hyponatremia have resolved, insulin drip has been stopped, he was aggressively hydrated with D5W, now electrolytes are stable renal function has remarkably improved but mentation still looks poor, unable to take anything orally since admission 5 days ago, seen by palliative care discussions with niece who is the primary decision maker were made, at this time goal of care is comfort as he remains n.p.o.  Will be discharged to residential hospice versus SNF with hospice.  Goal of care is comfort.  If mentation improves he can be reevaluated by speech and his p.o. status and chronic medications can be be addressed, for now his prognosis appears extremely guarded.  2.  Severe metabolic encephalopathy.  Due to #1 above.  CT head and MRI brain nonacute.  Early minimal clinical improvement with supportive care, speech therapy on board remains n.p.o. currently, please see #1 above, long-term prognosis still appears grim.  3.  Hypokalemia.  Replaced and stable.  4.  Underlying Mild-Moderate dementia.    #1 above.  5.  Hypertension.  Currently n.p.o. due to encephalopathy.  Comfort medications only for now.     Discharge diagnosis     Principal Problem:   Severe dehydration Active Problems:   Essential hypertension   DM (diabetes mellitus), type 2 with renal complications (HCC)   Dementia   Acute encephalopathy   Elevated lactic acid level   Acute renal failure superimposed on stage 2 chronic kidney disease (HCC)   Goals of care, counseling/discussion   Palliative care by specialist   Malnutrition of moderate degree    Discharge instructions    Discharge Instructions    Discharge instructions   Complete by:  As directed    Follow with Primary MD [name], MD in 7 days     Get CBC, CMP, 2 view Chest X ray checked  by Primary MD or SNF MD in 5-7 days    Activity: As tolerated with Full fall precautions use walker/cane & assistance as needed  Disposition Residential Hospice or SNF with Hospice  Diet: NPO,  speech re-eval in 2-3 days    Special Instructions: If you have smoked or chewed Tobacco  in the last 2 yrs please stop smoking, stop any regular Alcohol  and or any Recreational drug use.  On your next visit with your primary care physician please Get Medicines reviewed and adjusted.  Please request your Prim.MD to go over all Hospital Tests and Procedure/Radiological results at the follow up, please get all Hospital records sent to your Prim MD by signing hospital release before you go home.  If you experience worsening of your admission symptoms, develop shortness of breath, life threatening emergency, suicidal or homicidal thoughts you must seek medical attention immediately by calling 911 or calling your MD immediately  if symptoms less severe.   Increase activity slowly   Complete by:  As directed       Discharge Medications   Allergies as of 05/23/2018   No Known Allergies     Medication List    STOP taking these medications   acetaminophen 500 MG tablet Commonly known as:  TYLENOL   amLODipine 10 MG tablet Commonly known as:  NORVASC   docusate sodium 100 MG capsule Commonly known as:  COLACE   donepezil 10 MG tablet Commonly known as:  ARICEPT   HYDROcodone-acetaminophen 7.5-325 MG tablet Commonly known as:  NORCO   megestrol 400 MG/10ML suspension Commonly known as:  MEGACE   metFORMIN 500 MG tablet Commonly known as:  GLUCOPHAGE   mirtazapine 7.5 MG tablet Commonly known as:  REMERON   NON FORMULARY   polyethylene glycol packet Commonly known as:  MIRALAX / GLYCOLAX   PRESCRIPTION MEDICATION   RESOURCE 2.0 Liqd   senna-docusate 8.6-50 MG tablet Commonly known as:  Senokot-S   sertraline 50 MG  tablet Commonly known as:  ZOLOFT   simvastatin 40 MG tablet Commonly known as:  ZOCOR   thiamine 100 MG tablet     TAKE these medications   bisacodyl 10 MG suppository Commonly known as:  DULCOLAX Place 1 suppository (10 mg total) rectally daily as needed for moderate constipation.   insulin regular  100 units/mL injection Commonly known as:  NOVOLIN R,HUMULIN R Inject 2-8 Units into the skin See admin instructions. Inject 2-8 units into the skin three times a day BEFORE meals, per sliding scale: BGL 201-250 = 2 units; 251-300 = 4 units; 301-350 = 6 units; 351-400 = 8 units   LORazepam 2 MG/ML concentrated solution Commonly known as:  ATIVAN Take 0.5 mLs (1 mg total) by mouth every 6 (six) hours as needed for anxiety.   morphine CONCENTRATE 10 MG/0.5ML Soln concentrated solution Take 0.5 mLs (10 mg total) by mouth every 3 (three) hours as needed for moderate pain or severe pain.   polyvinyl alcohol 1.4 % ophthalmic solution Commonly known as:  LIQUIFILM TEARS Place 1 drop into both eyes as needed for dry eyes.       Follow-up Information    [name], MD Follow up in 1 week(s).   Specialty:  Family Medicine Why:  as needed Contact information: [address] [place] [address] [phone]           Major procedures and Radiology Reports - PLEASE review detailed and final reports thoroughly  -       Ct Head Wo Contrast  Result Date: 05/17/2018 CLINICAL DATA:  Unresponsive for 2 days. Unable to obtain a blood pressure. Right-sided facial droop. EXAM: CT HEAD WITHOUT CONTRAST TECHNIQUE: Contiguous axial images were obtained from the base of the skull through the vertex without intravenous contrast. COMPARISON:  02/20/2018 FINDINGS: Brain: Diffuse cerebral atrophy. Ventricular dilatation consistent with central atrophy. Cavum septum pellucidum. Low-attenuation changes throughout the deep white matter consistent with small vessel ischemic changes. No  mass-effect or midline shift. No abnormal extra-axial fluid collections. Gray-white matter junctions are distinct. Basal cisterns are not effaced. No acute intracranial hemorrhage. Vascular: Mild intracranial arterial calcifications are present. Skull: Calvarium appears intact. Sinuses/Orbits: Paranasal sinuses and mastoid air cells are clear. Other: None. IMPRESSION: No acute intracranial abnormalities. Chronic atrophy and small vessel ischemic changes. Electronically Signed   By: Burman Nieves M.D.   On: 05/17/2018 06:09   Mr Brain Wo Contrast  Result Date: 05/19/2018 CLINICAL DATA:  Cerebral hemorrhage suspected. Altered mental status beginning 2 days ago at skilled nurse facility. EXAM: MRI HEAD WITHOUT CONTRAST TECHNIQUE: Multiplanar, multiecho pulse sequences of the brain and surrounding structures were obtained without intravenous contrast. COMPARISON:  None. FINDINGS: Brain: Moderate atrophy and white matter changes are present bilaterally. No acute hemorrhage is present. Dilated perivascular spaces are evident. 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 White matter changes extend into the brainstem. Susceptibility weighted imaging demonstrates 4 punctate foci susceptibility, 1 in the left frontal lobe, 1 in the right cerebellum, and 2 in the right parietal lobe. A cavum septum pellucidum is noted. Ventricles are otherwise proportionate to the degree of atrophy. Brainstem and cerebellum are within normal limits. Vascular: Flow is present in the major intracranial arteries Skull and upper cervical spine: The skull base is within normal limits. Craniocervical junction is normal. Sinuses/Orbits: The paranasal sinuses and mastoid air cells are clear. Globes and orbits are within normal limits. IMPRESSION: 1. No acute hemorrhage or infarct. 2. Advanced atrophy and white matter disease. This likely reflects the sequela of chronic microvascular ischemia. 3. At least 4 punctate foci of  susceptibility, 1 in the left frontal lobe, 2 in the right parietal lobe, and 1 in the cerebellum. 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 Electronically Signed   By: Virl Son.D.  On: 05/19/2018 12:52   US Renal  Result Date: 05/17/2018 CLINICAL DATA:  Acute renal failure. EXAM: RENAL / URINARY TRACT ULTRASOUND COMPLETE COMPARISON:  Renal ultrasound dated 11/16/2017 and CT scan of the abdomen dated 01/22/2015 FINDINGS: Right Kidney: Length: 10.1 cm. 5 mm stone in the mid right kidney, unchanged since the prior CT scan. No hydronephrosis. Echogenicity is normal. No masses. Left Kidney: Length: 10.3 cm. Echogenicity is normal. No hydronephrosis or mass. Bladder: Appears normal for degree of bladder distention.Prostate gland appears prominent. IMPRESSION: 1. No acute abnormality. 2. Chronic small stone in the mid right kidney. Electronically Signed   By: Francene Boyers M.D.   On: 05/17/2018 14:39   Dg Chest Port 1 View  Result Date: 05/17/2018 CLINICAL DATA:  Initial evaluation for acute unresponsiveness. EXAM: PORTABLE CHEST 1 VIEW COMPARISON:  Prior radiograph from 02/20/2018. FINDINGS: Cardiac and mediastinal silhouettes are stable in size and contour, and remain within normal limits. Aortic atherosclerosis. Lungs hypoinflated. No focal infiltrate, pulmonary edema or pleural effusion. No pneumothorax. No acute osseus abnormality. IMPRESSION: 1. No active cardiopulmonary disease. 2. Aortic atherosclerosis. Electronically Signed   By: Rise Mu M.D.   On: 05/17/2018 05:43    Micro Results     Recent Results (from the past 240 hour(s))  Urine culture     Status: None   Collection Time: 05/17/18  5:36 AM  Result Value Ref Range Status   Specimen Description URINE, RANDOM  Final   Special Requests NONE  Final   Culture   Final    NO GROWTH Performed at [facility], [address], [place], [place] [address]    Report Status  05/18/2018 FINAL  Final  Culture, blood (Routine X 2) w Reflex to ID Panel     Status: None   Collection Time: 05/17/18  9:40 AM  Result Value Ref Range Status   Specimen Description BLOOD RIGHT WRIST  Final   Special Requests   Final    BOTTLES DRAWN AEROBIC ONLY Blood Culture results may not be optimal due to an inadequate volume of blood received in culture bottles   Culture   Final    NO GROWTH 5 DAYS Performed at [facility], [address], [place], [place] [address]    Report Status 05/22/2018 FINAL  Final  Culture, blood (Routine X 2) w Reflex to ID Panel     Status: None   Collection Time: 05/17/18  9:45 AM  Result Value Ref Range Status   Specimen Description BLOOD RIGHT HAND  Final   Special Requests   Final    BOTTLES DRAWN AEROBIC AND ANAEROBIC Blood Culture results may not be optimal due to an inadequate volume of blood received in culture bottles   Culture   Final    NO GROWTH 5 DAYS Performed at [facility], [address] [place], [place] [address]    Report Status 05/22/2018 FINAL  Final  MRSA PCR Screening     Status: None   Collection Time: 05/17/18  4:24 PM  Result Value Ref Range Status   MRSA by PCR NEGATIVE NEGATIVE Final    Comment:        The GeneXpert MRSA Assay (FDA approved for NASAL specimens only), is one component of a comprehensive MRSA colonization surveillance program. It is not intended to diagnose MRSA infection nor to guide or monitor treatment for MRSA infections. Performed at [facility], [address], [place], [place] [address]  Today   Subjective    Gwendlyn DeutscherWilliam Silberman today remains in bed looking lethargic, will not answer questions or follow commands except opening eyes to verbal commands.   Objective   Blood pressure (!) 131/99, pulse (!) 126, temperature 98.5 F (36.9 C), temperature source Oral, resp. rate 20, height 5' 7.99" (1.727 m), weight 45.4 kg, SpO2 100 %.   Intake/Output Summary (Last 24  hours) at 05/23/2018 0759 Last data filed at 05/23/2018 0000 Gross per 24 hour  Intake 444.69 ml  Output 400 ml  Net 44.69 ml    Exam Awake but still did not respond to verbal questions or oral commands except eye-opening, withdraws to pain all 4 extremities [place].AT,PERRAL Supple Neck,No JVD, No cervical lymphadenopathy appriciated.  Symmetrical Chest wall movement, Good air movement bilaterally, CTAB RRR,No Gallops,Rubs or new Murmurs, No Parasternal Heave +ve B.Sounds, Abd Soft, Non tender, No organomegaly appriciated, No rebound -guarding or rigidity. No Cyanosis, Clubbing or edema, No new Rash or bruise   Data Review   CBC w Diff:  Lab Results  Component Value Date   WBC 5.8 05/22/2018   HGB 10.0 (L) 05/22/2018   HCT 30.6 (L) 05/22/2018   PLT 104 (L) 05/22/2018   LYMPHOPCT 7 05/17/2018   MONOPCT 5 05/17/2018   EOSPCT 0 05/17/2018   BASOPCT 0 05/17/2018    CMP:  Lab Results  Component Value Date   NA 140 05/23/2018   K 3.8 05/23/2018   CL 102 05/23/2018   CO2 27 05/23/2018   BUN 24 (H) 05/23/2018   CREATININE 1.64 (H) 05/23/2018   PROT 5.4 (L) 05/23/2018   ALBUMIN 2.3 (L) 05/23/2018   BILITOT 1.0 05/23/2018   ALKPHOS 91 05/23/2018   AST 33 05/23/2018   ALT 19 05/23/2018  .   Total Time in preparing paper work, data evaluation and todays exam - 35 minutes  Susa RaringPrashant Icelyn Navarrete M.D on 05/23/2018 at 7:59 AM  [facility]   Office  [phone]

## 2018-05-22 NOTE — Progress Notes (Signed)
  Speech Language Pathology Treatment: Dysphagia  Patient Details Name: Richard Powers MRN: [id number] DOB: 04/29/1946 Today's Date: 05/22/2018 Time: 1530-1540 SLP Time Calculation (min) (ACUTE ONLY): 10 min  Assessment / Plan / Recommendation Clinical Impression  Pt continues to present with dysphagia with inattention, agitation/yelling with efforts to provide oral care. Pt did accept ice chips, 1/2 tspn boluses of water - there was limited effort, prolonged mastication, and audible swallow which led to coughing and sudden onset of vomiting.  Session ceased; nursing notified; oral suctioning provided and emesis cleaned from pt.  Pt has discharge orders pending with potential hospice f/u - there is little else SLP service can offer at this time.  Our service will respectfully sign off.   HPI HPI: Pt is a 72 y.o. male with medical history significant for HTN; HLD; DM; CKD; and dementia presenting with AMS. Per MD note, pt with AKI due to severe dehydration. CXR and CT Head negative.       SLP Plan  Discharge SLP treatment due to (comment)(no progression toward goals)       Recommendations  Diet recommendations: NPO                Follow up Recommendations: Other (comment)(pt d/cing to either residential hospice or SNF with hospice) SLP Visit Diagnosis: Dysphagia, oropharyngeal phase (R13.12) Plan: Discharge SLP treatment due to (comment)(no progression toward goals)       GO                Blenda MountsCouture, Man Bonneau Laurice 05/22/2018, 3:45 PM

## 2018-05-22 NOTE — Progress Notes (Signed)
CSW sent patient's niece a HIPPA compliant text as well to contact CSW.  Osborne Cascoadia Jaksen Fiorella LCSW [phone]

## 2018-05-22 NOTE — Progress Notes (Signed)
[name] contacted CSW back. CSW explained that patient was medically stable to discharge to the next care level and described the options-SNF with hospice [facility]on([facility] or [address] [facility]) versus residential hospice ([facility] [name] stated that she would consult her husband and call CSW back.   Osborne Cascoadia Jacory Kamel LCSW [phone]

## 2018-05-23 LAB — COMPREHENSIVE METABOLIC PANEL
ALK PHOS: 91 U/L (ref 38–126)
ALT: 19 U/L (ref 0–44)
ANION GAP: 11 (ref 5–15)
AST: 33 U/L (ref 15–41)
Albumin: 2.3 g/dL — ABNORMAL LOW (ref 3.5–5.0)
BUN: 24 mg/dL — ABNORMAL HIGH (ref 8–23)
CALCIUM: 7.3 mg/dL — AB (ref 8.9–10.3)
CO2: 27 mmol/L (ref 22–32)
CREATININE: 1.64 mg/dL — AB (ref 0.61–1.24)
Chloride: 102 mmol/L (ref 98–111)
GFR calc non Af Amer: 40 mL/min — ABNORMAL LOW (ref >60)
GFR, EST AFRICAN AMERICAN: 47 mL/min — AB (ref >60)
Glucose, Bld: 76 mg/dL (ref 70–99)
Potassium: 3.8 mmol/L (ref 3.5–5.1)
Sodium: 140 mmol/L (ref 135–145)
Total Bilirubin: 1 mg/dL (ref 0.3–1.2)
Total Protein: 5.4 g/dL — ABNORMAL LOW (ref 6.5–8.1)

## 2018-05-23 LAB — GLUCOSE, CAPILLARY
GLUCOSE-CAPILLARY: 68 mg/dL — AB (ref 70–99)
GLUCOSE-CAPILLARY: 77 mg/dL (ref 70–99)
GLUCOSE-CAPILLARY: 85 mg/dL (ref 70–99)

## 2018-05-23 NOTE — Progress Notes (Signed)
[facility] Anticipated DC date: 05/23/18 Family notified: Niece, CounsellorDenise Transport by: Sharin MonsPTAR    Per MD patient ready for [facility]. RN, patient, patient's family, and facility notified of DC. Discharge Summary sent to facility. RN given number for report [phone]). DC packet on chart. Ambulance transport requested for patient.   CSW signing off.  Cristobal GoldmannNadia Dhriti Fales, LCSW Clinical Social Worker [phone]

## 2018-05-23 NOTE — Clinical Social Work Note (Signed)
Clinical Social Work Assessment  Patient Details  Name: Richard Powers MRN: [id number] Date of Birth: 08-04-1946  Date of referral:  05/23/18               Reason for consult:  Facility Placement                Permission sought to share information with:  Facility Medical sales representativeContact Representative, Family Supports Permission granted to share information::  No  Name::     Web designerDenise  Agency::  SNFs  Relationship::  Niece  Contact Information:     Housing/Transportation Living arrangements for the past 2 months:  Skilled Building surveyorursing Facility Source of Information:  Other (Comment Required) Patient Interpreter Needed:  None Criminal Activity/Legal Involvement Pertinent to Current Situation/Hospitalization:  No - Comment as needed Significant Relationships:  Siblings, Other Family Members Lives with:  Facility Resident Do you feel safe going back to the place where you live?  No Need for family participation in patient care:  Yes (Comment)  Care giving concerns:  CSW received consult for possible SNF/Hospice placement at time of discharge. CSW spoke with patient's niece regarding recommendation of Hospice placement at time of discharge. [name] reported that [facility] and they are forming a complaint. [name] expressed understanding of recommendation and is agreeable to SNF or hospice placement at time of discharge. CSW to continue to follow and assist with discharge planning needs.   Social Worker assessment / plan:  CSW spoke with patient's niece concerning possibility of SNF or Hospice placement.   Employment status:  Retired Database administratornsurance information:  Managed Medicare PT Recommendations:  Not assessed at this time Information / Referral to community resources:  Skilled Nursing Facility  Patient/Family's Response to care:  [name] recognizes need for discharge planning and will let CSW know of her decision.   Patient/Family's  Understanding of and Emotional Response to Diagnosis, Current Treatment, and Prognosis:  Patient/family is realistic regarding therapy needs and expressed being hopeful for SNF/hospice placement. [name] expressed understanding of CSW role and discharge process as well as medical condition. No questions/concerns about plan or treatment.    Emotional Assessment Appearance:  Appears stated age Attitude/Demeanor/Rapport:  Unable to Assess Affect (typically observed):  Unable to Assess Orientation:  (Disoriented) Alcohol / Substance use:  Not Applicable Psych involvement (Current and /or in the community):  No (Comment)  Discharge Needs  Concerns to be addressed:  Care Coordination Readmission within the last 30 days:  No Current discharge risk:  None Barriers to Discharge:  No Barriers Identified   Mearl Latinadia S Aleczander Fandino, LCSWA 05/23/2018, 1:34 PM

## 2018-05-23 NOTE — Progress Notes (Signed)
Nutrition Brief Note  Chart reviewed. Pt now transitioning to comfort care.  No further nutrition interventions warranted at this time.  Please re-consult as needed.   Treyshon Buchanon A. Tamma Brigandi, RD, LDN, CDE Pager: [phone] After hours Pager: [phone]  

## 2018-05-23 NOTE — [place] FL2 (Signed)
[place] MEDICAID FL2 LEVEL OF CARE SCREENING TOOL     IDENTIFICATION  Patient Name: Richard Powers Birthdate: 1945-12-20 Sex: male Admission Date (Current Location): 05/17/2018  [facility] [place]   Facility and Address:  [facility]. [facility], [address], [place], [place] [address]      Provider Number: [id number]  Attending Physician Name and Address:  Leroy SeaSingh, [name], MD  Relative Name and Phone Number:  [name], sister, [phone]    Current Level of Care: Hospital Recommended Level of Care: Skilled Nursing Facility Prior Approval Number:    Date Approved/Denied:   PASRR Number: [id number]  Discharge Plan: SNF    Current Diagnoses: Patient Active Problem List   Diagnosis Date Noted  . Malnutrition of moderate degree 05/19/2018  . Goals of care, counseling/discussion   . Palliative care by specialist   . Severe dehydration 05/17/2018  . Acute renal failure superimposed on stage 2 chronic kidney disease (HCC) 05/17/2018  . Closed left hip fracture, initial encounter (HCC) 02/20/2018  . CKD (chronic kidney disease) stage 2, GFR 60-89 ml/min 02/20/2018  . Closed left hip fracture (HCC) 02/20/2018  . Dehydration 11/16/2017  . AKI (acute kidney injury) (HCC) 11/16/2017  . Acute encephalopathy 11/16/2017  . Elevated lactic acid level 11/16/2017  . Glaucoma 10/17/2017  . Dementia 09/15/2017  . Sepsis (HCC) 09/09/2017  . Essential hypertension 09/09/2017  . DM (diabetes mellitus), type 2 with renal complications (HCC) 09/09/2017    Orientation RESPIRATION BLADDER Height & Weight     (Disoriented x4)  O2(Nasal cannula 2L) Incontinent, Indwelling catheter Weight: 45.4 kg Height:  5' 7.99" (172.7 cm)  BEHAVIORAL SYMPTOMS/MOOD NEUROLOGICAL BOWEL NUTRITION STATUS      Continent Diet(Please see DC Summary)  AMBULATORY STATUS COMMUNICATION OF NEEDS Skin   Total Care Non-Verbally PU Stage and Appropriate Care                       Personal Care Assistance Level of Assistance  Bathing, Feeding, Dressing Bathing Assistance: Maximum assistance Feeding assistance: Maximum assistance Dressing Assistance: Maximum assistance     Functional Limitations Info  Sight, Hearing, Speech Sight Info: Adequate Hearing Info: Adequate Speech Info: Impaired    SPECIAL CARE FACTORS FREQUENCY  (Hospice at the facility)     PT Frequency: 5x/week OT Frequency: 3x/week            Contractures Contractures Info: Not present    Additional Factors Info  Code Status, Allergies Code Status Info: DNR Allergies Info: NKA   Insulin Sliding Scale Info: 3x daily with meals and at bedtime       Current Medications (05/23/2018):  This is the current hospital active medication list Current Facility-Administered Medications  Medication Dose Route Frequency Provider Last Rate Last Dose  . acetaminophen (TYLENOL) suppository 650 mg  650 mg Rectal Q6H PRN Leroy SeaSingh, [name], MD      . aspirin suppository 150 mg  150 mg Rectal Daily Leroy SeaSingh, [name], MD   150 mg at 05/23/18 0933  . bisacodyl (DULCOLAX) suppository 10 mg  10 mg Rectal Daily PRN Leroy SeaSingh, [name], MD      . chlorhexidine (PERIDEX) 0.12 % solution 15 mL  15 mL Mouth Rinse BID Leroy SeaSingh, [name], MD   15 mL at 05/23/18 0933  . heparin injection 5,000 Units  5,000 Units Subcutaneous Q8H Leroy SeaSingh, [name], MD   5,000 Units at 05/23/18 [phone]  . MEDLINE mouth rinse  15 mL Mouth Rinse q12n4p [name], MD   15 mL at 05/23/18 1205  . metoprolol tartrate (LOPRESSOR) injection 5 mg  5 mg Intravenous Q6H PRN [name], MD   5 mg at 05/19/18 1036  . ondansetron (ZOFRAN) tablet 4 mg  4 mg Oral Q6H PRN [name], MD       Or  . ondansetron (ZOFRAN) injection 4 mg  4 mg Intravenous Q6H PRN [name], MD      . polyvinyl alcohol (LIQUIFILM TEARS) 1.4 % ophthalmic solution 1 drop  1 drop Both Eyes PRN Susa Raring K, MD      . sodium  chloride flush (NS) 0.9 % injection 3 mL  3 mL Intravenous Q12H [name], MD   3 mL at 05/23/18 0933  . sodium phosphate (FLEET) 7-19 GM/118ML enema 1 enema  1 enema Rectal Once PRN [name], MD      . thiamine (B-1) injection 100 mg  100 mg Intravenous Daily [name], MD   100 mg at 05/23/18 1610     Discharge Medications: Please see discharge summary for a list of discharge medications.  Relevant Imaging Results:  Relevant Lab Results:   Additional Information SS#: [id number]           Add Hospice at the SNF  [facility], LCSWA

## 2018-05-23 NOTE — Progress Notes (Signed)
[name] contacted CSW back. She reports that she works third shift and has been asleep. She states that [facility] will [facility] since [facility] her house and add Hospice services there. She does not want to drive [facility]. [facility] will [facility] to [facility] the paperwork by 4:30pm. [facility] aware. [name] asked CSW why patient is not eating. CSW explained that she would need to consult the RN for that information.   Osborne Cascoadia Marily Konczal LCSW [phone]

## 2018-05-23 NOTE — Progress Notes (Signed)
Patient was discharged to nursing home [facility] by MD order; discharged instructions review and sent to patient with care notes and prescriptions; IV DIC; facility was called and report was given to the nurse who is going to receive patient; patient will be transported to facility via [facility].

## 2018-05-23 NOTE — Clinical Social Work Placement (Signed)
   CLINICAL SOCIAL WORK PLACEMENT  NOTE  Date:  05/23/2018  Patient Details  Name: Richard Powers MRN: [id number] Date of Birth: [birth year]  Clinical Social Work is seeking post-discharge placement for this patient at the Skilled  Nursing Facility level of care (*CSW will initial, date and re-position this form in  chart as items are completed):  Yes   Patient/family provided with [facility] Clinical Social Work Department's list of facilities offering this level of care within the geographic area requested by the patient (or if unable, by the patient's family).  Yes   Patient/family informed of their freedom to choose among providers that offer the needed level of care, that participate in Medicare, Medicaid or managed care program needed by the patient, have an available bed and are willing to accept the patient.  Yes   Patient/family informed of [facility]'s ownership interest in [facility] and [facility], as well as of the fact that they are under no obligation to receive care at these facilities.  PASRR submitted to EDS on       PASRR number received on       Existing PASRR number confirmed on 05/23/18     FL2 transmitted to all facilities in geographic area requested by pt/family on 05/23/18     FL2 transmitted to all facilities within larger geographic area on       Patient informed that his/her managed care company has contracts with or will negotiate with certain facilities, including the following:        Yes   Patient/family informed of bed offers received.  [facility]     Physician recommends and patient chooses bed at      Patient to be transferred [facility] on 05/23/18.  Patient to be transferred to facility by [facility]     Patient family notified on 05/23/18 of transfer.  Name of family member notified:  Richard Powers, niece     PHYSICIAN       Additional Comment:     _______________________________________________ Mearl LatinNadia S Averie Hornbaker, LCSWA 05/23/2018, 1:37 PM

## 2018-05-23 NOTE — Progress Notes (Signed)
CSW left another voicemail for patient's niece regarding discharge plan decision.   Osborne Cascoadia Natalya Domzalski LCSW [phone]

## 2019-03-19 NOTE — Congregational Nurse Program (Unsigned)
  Dept: [phone]   Congregational Nurse Program Note  Date of Encounter: 09/19/2017  Past Medical History: Past Medical History:  Diagnosis Date  . CKD (chronic kidney disease)   . Depression   . Diabetes mellitus without complication ([place])   . Encephalopathy   . Hyperlipidemia   . Hypertension     Encounter Details:BP check

## 2019-08-01 IMAGING — DX DG PELVIS 1-2V
1 series · 1 of 1 positions shown · non-contrast
Comparison: None.

CLINICAL DATA: Fall this morning with bilateral hip pain and right
knee pain.

EXAM:
PELVIS - 1-2 VIEW

[pelvis ap]
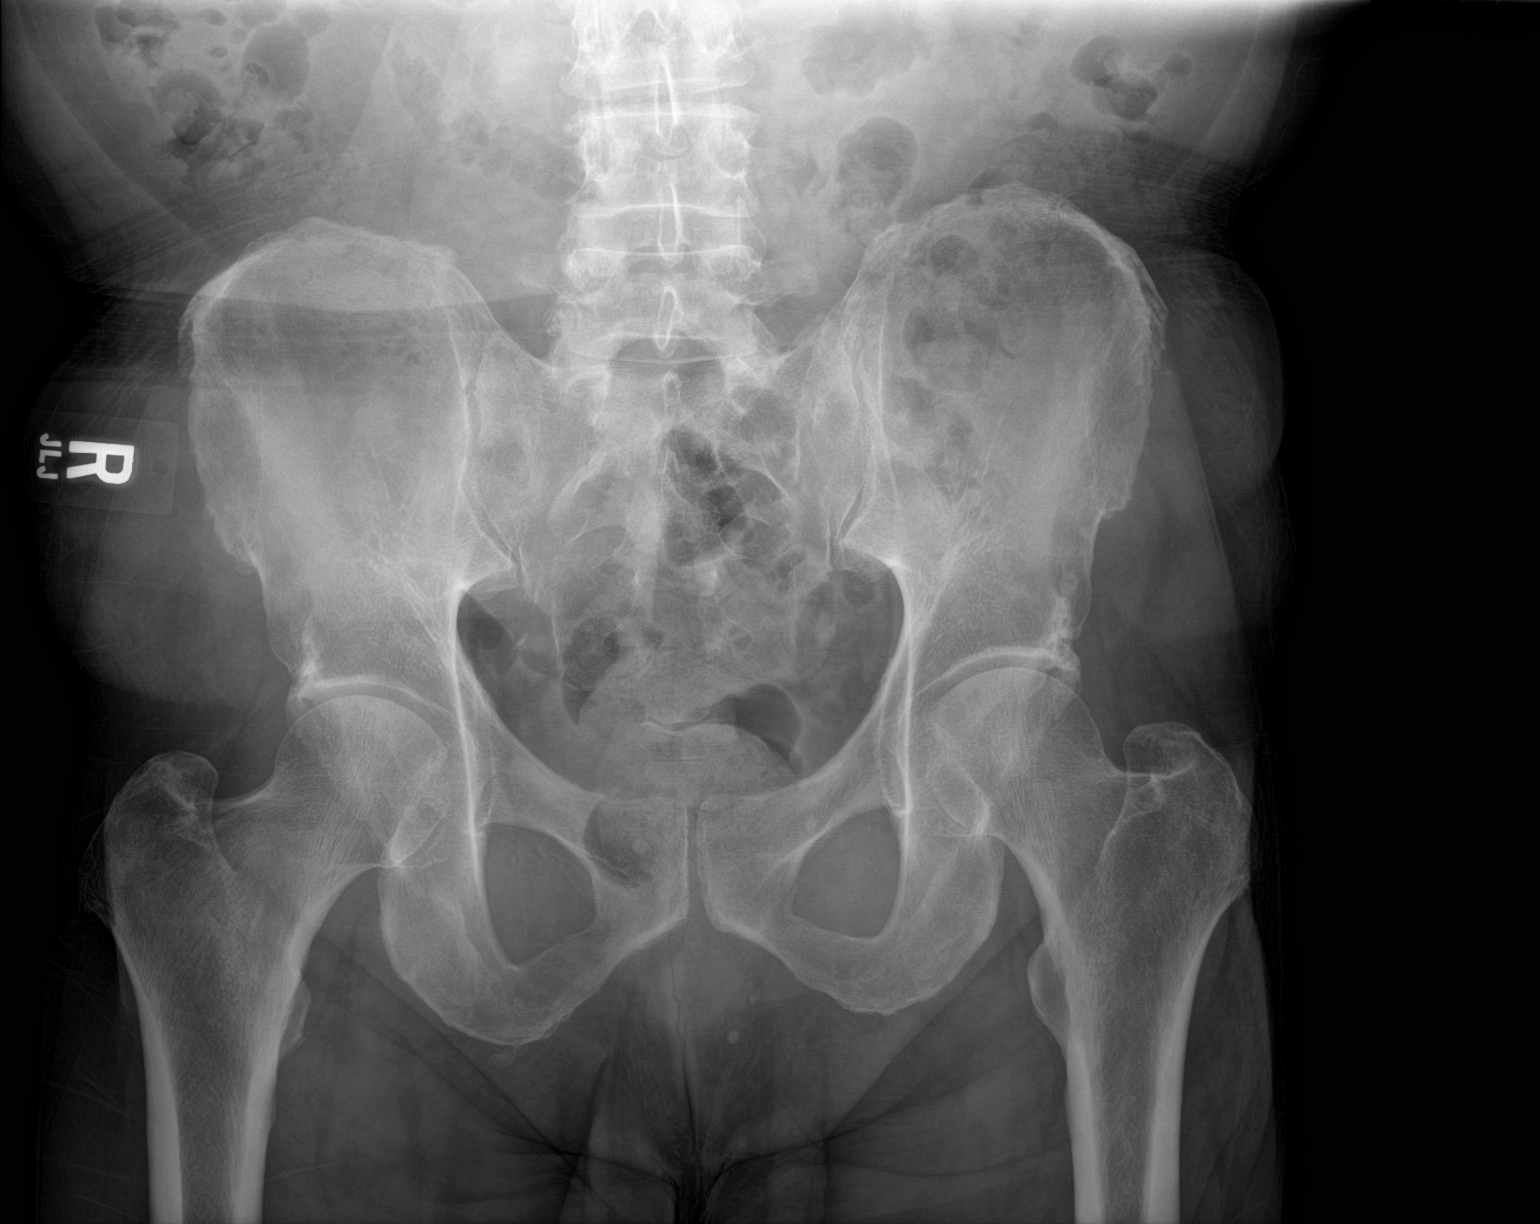

[1 of 1 positions shown; findings below may reference images not displayed]

FINDINGS: The cortical margins of the bony pelvis are intact. No fracture.
Pubic symphysis and sacroiliac joints are congruent. Both femoral
heads are well-seated in the respective acetabula. Mild degenerative
change of both hips with acetabular osteophytes.
IMPRESSION: No pelvic fracture.

## 2019-08-20 IMAGING — CT CT HEAD W/O CM
4 series · 15 of 47 positions shown, 17 images · non-contrast
Comparison: None.

CLINICAL DATA: Head trauma, ataxia. Review of electronic records
states: Patient transported from [REDACTED] by EMS after being
d/c from this facility by cab. Per EMS bystanders state patient fell
twice attempting to walk from cab to doors and is unable to stand
without falling back."

EXAM:
CT HEAD WITHOUT CONTRAST
TECHNIQUE: Contiguous axial images were obtained from the base of the skull
through the vertex without intravenous contrast.

[Series 3: head wo · axial · 0.40mm/px · z∈[-157,-47]mm · 6 of 32 slices shown, 8 images]
[im 5/32  brain]
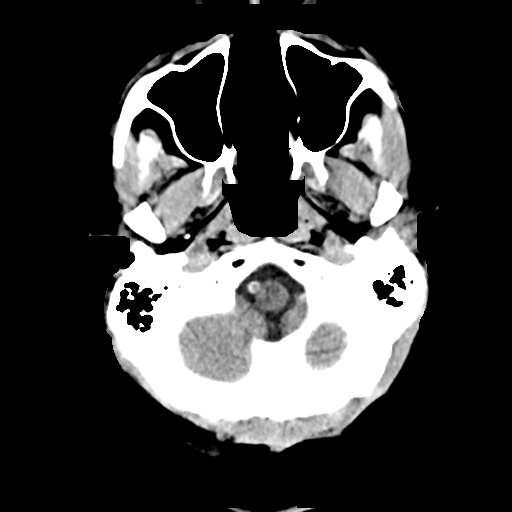
[im 5/32  bone]
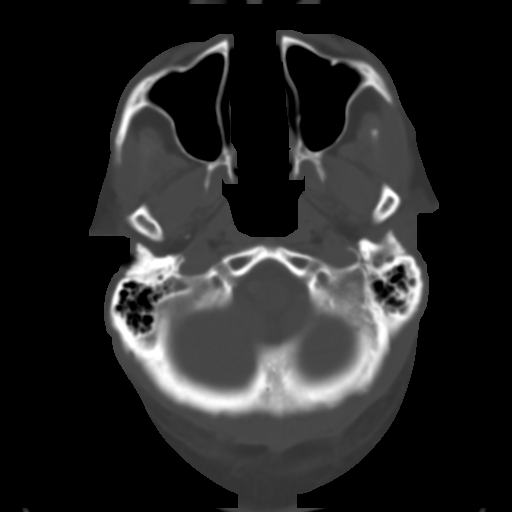
[im 9/32  brain]
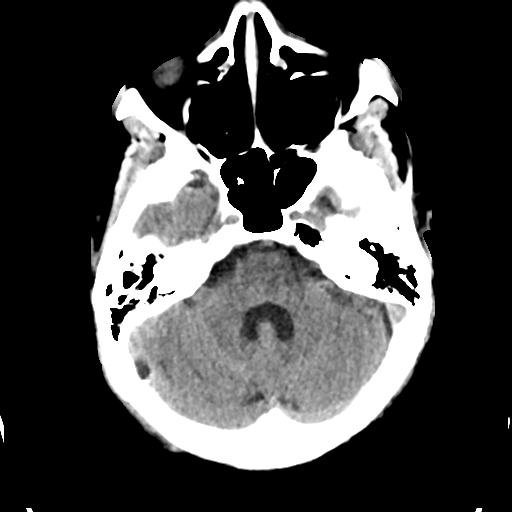
[im 14/32  brain]
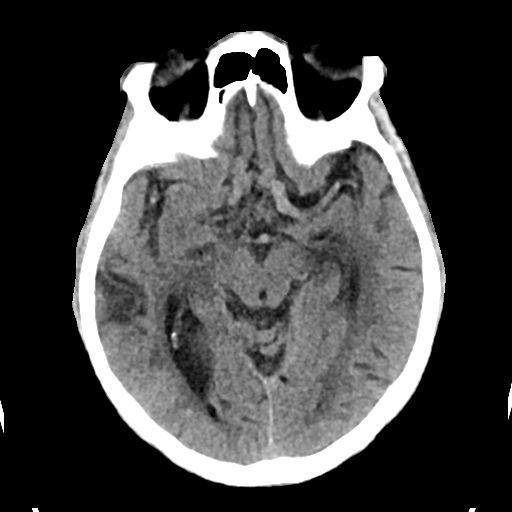
[im 18/32  brain]
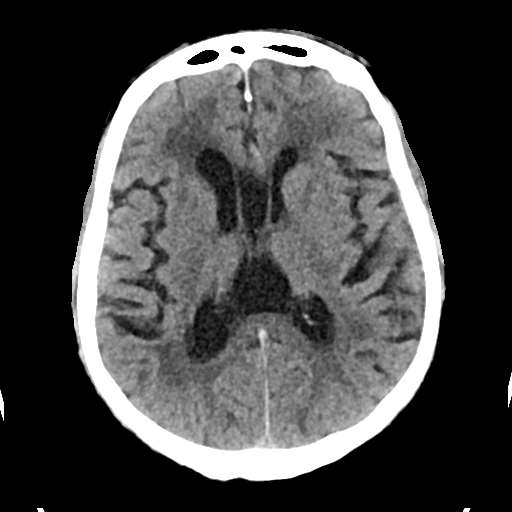
[im 23/32  brain]
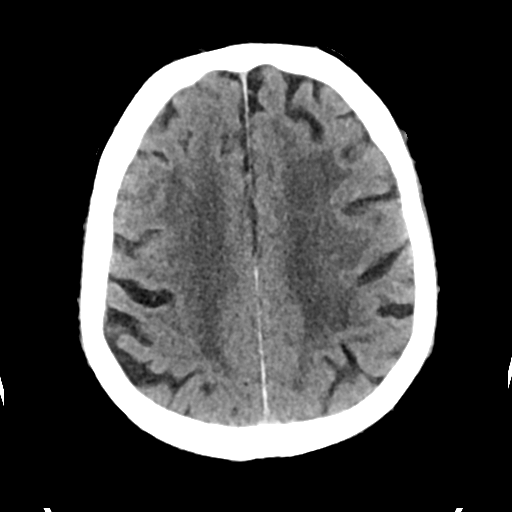
[im 23/32  bone]
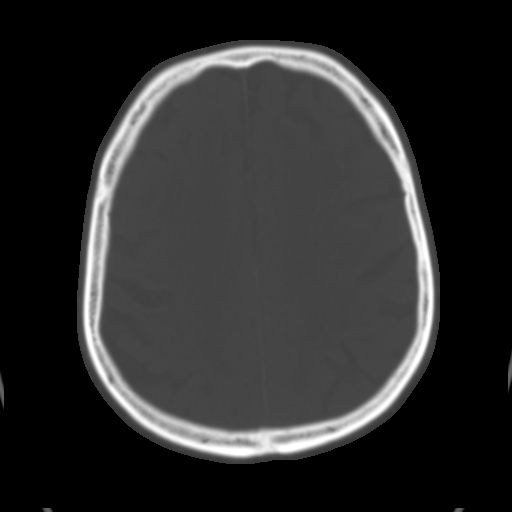
[im 27/32  brain]
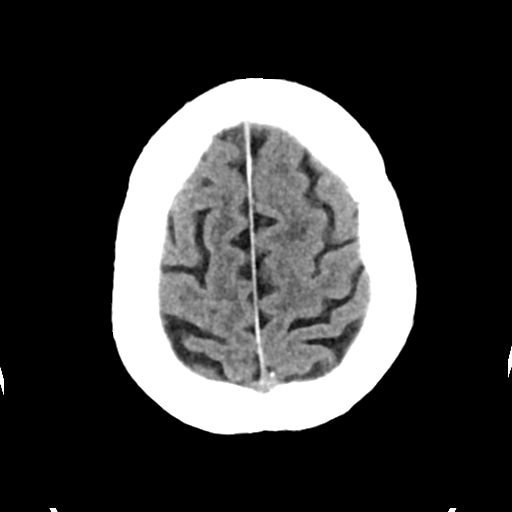

[Series 4: head bone · axial · 0.40mm/px · z∈[-163,-123]mm · 3 of 83 slices shown]
[im 8/83  bone]
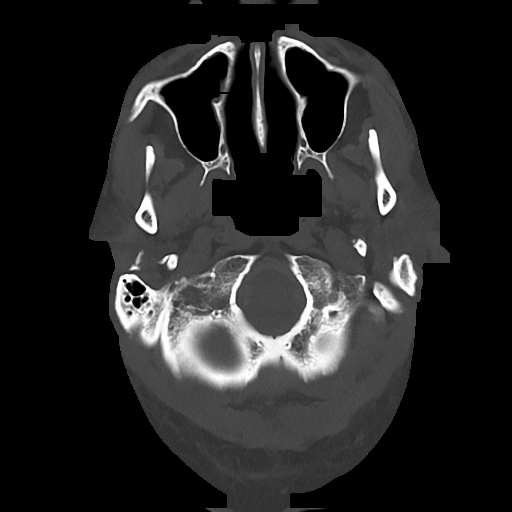
[im 16/83  bone]
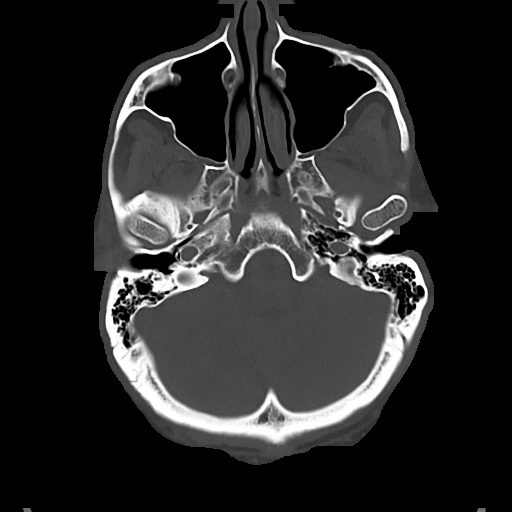
[im 28/83  bone]
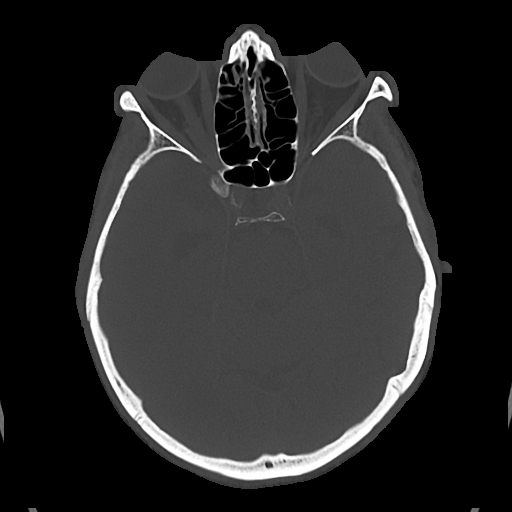

[Series 5: cor soft · coronal · 0.34mm/px · 3 of 65 slices shown]
[im 22/65  brain]
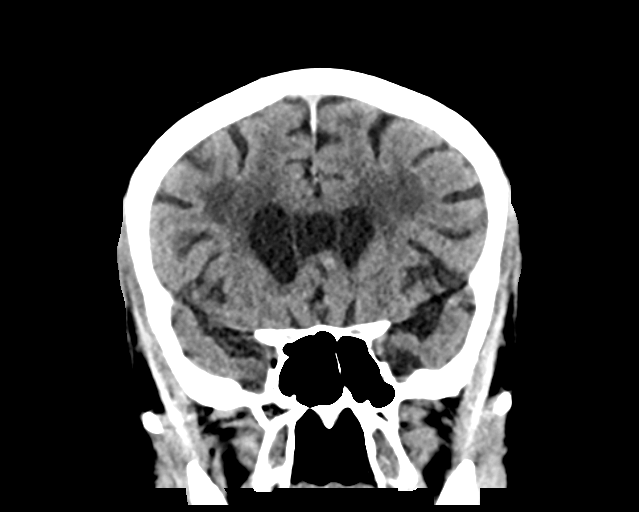
[im 29/65  brain]
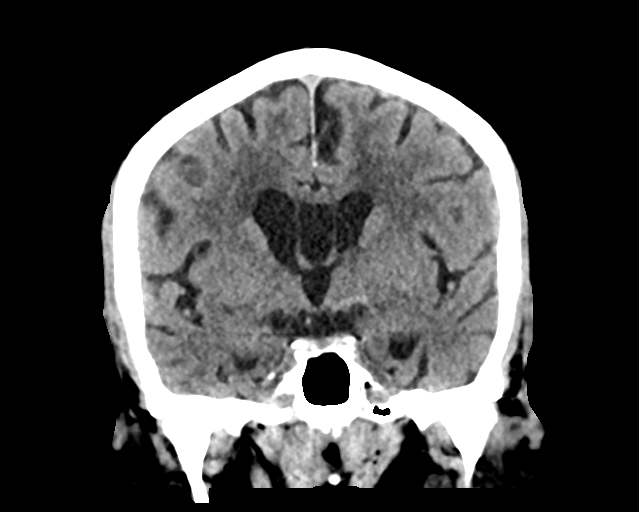
[im 36/65  brain]
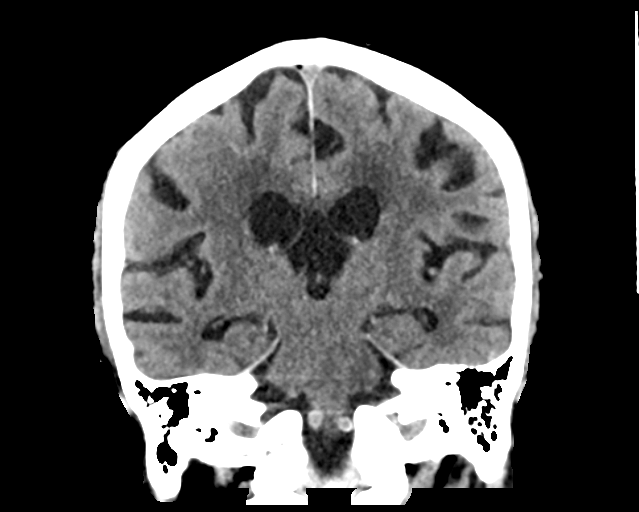

[Series 6: sag soft · sagittal · 0.33mm/px · 3 of 54 slices shown]
[im 18/54  brain]
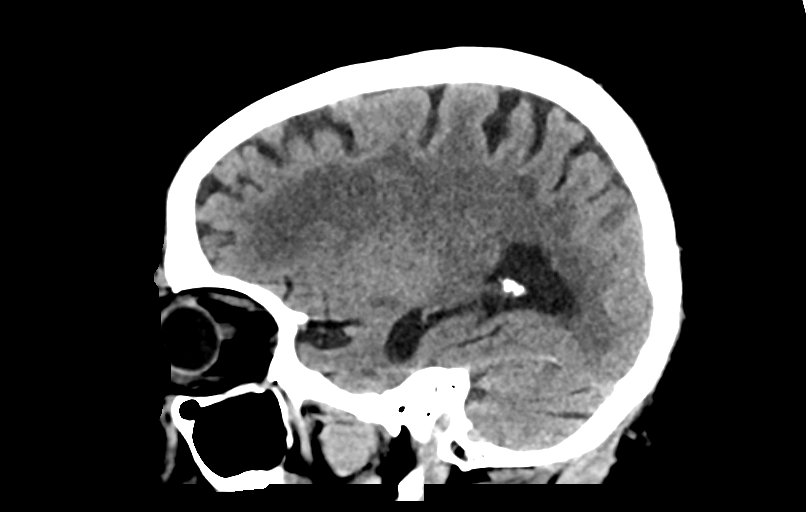
[im 27/54  brain]
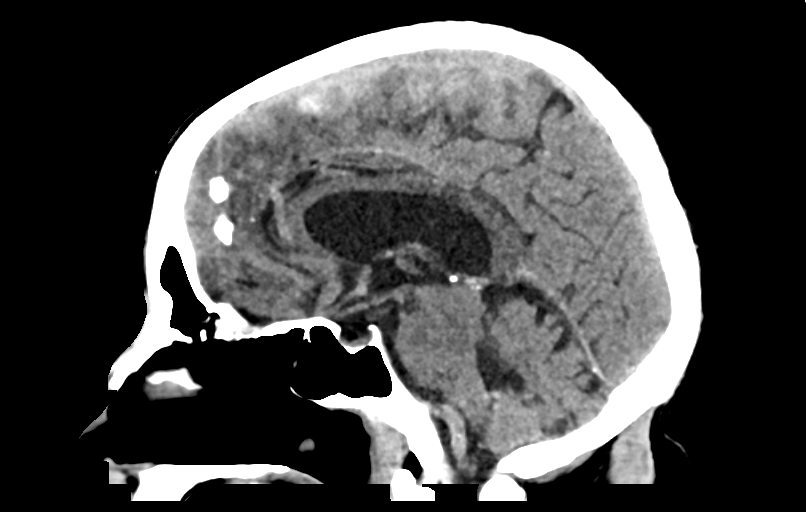
[im 36/54  brain]
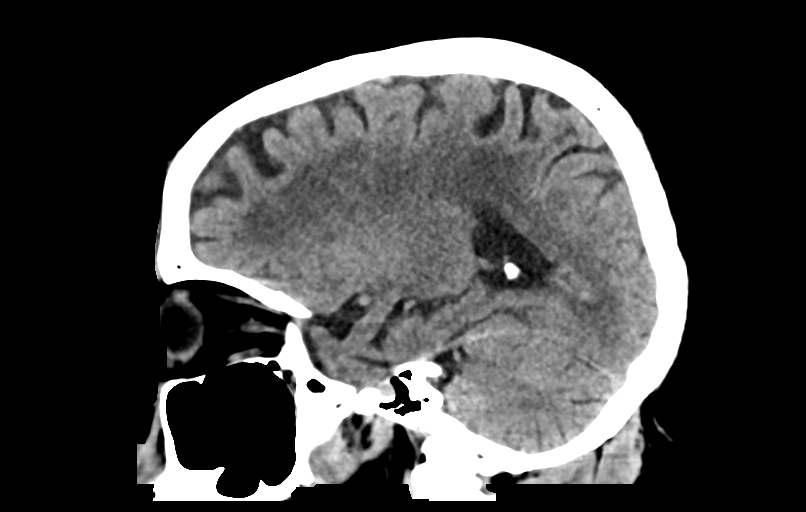

[15 of 47 positions shown; findings below may reference images not displayed]

FINDINGS: Brain: Moderate atrophy. Moderate to advanced chronic small vessel
ischemia. No intracranial hemorrhage, mass effect, or midline shift.
Ventricular prominence likely secondary to atrophy. Cavum septum
pellucidum, normal variant. The basilar cisterns are patent. No
evidence of territorial infarct or acute ischemia. No extra-axial or
intracranial fluid collection.

Vascular: Atherosclerosis of skullbase vasculature without
hyperdense vessel or abnormal calcification.

Skull: No fracture or focal lesion.

Sinuses/Orbits: No acute finding.

Other: None.
IMPRESSION: 1.  No acute intracranial abnormality.  No skull fracture.
2. Atrophy and moderate to advanced chronic small vessel ischemia.

## 2019-10-06 IMAGING — CR DG CHEST 2V
2 series · 2 of 2 positions shown · non-contrast
Comparison: Chest x-ray dated September 12, 2017.

CLINICAL DATA: Cough, flu like symptoms.

EXAM:
CHEST  2 VIEW

[w chest pa]
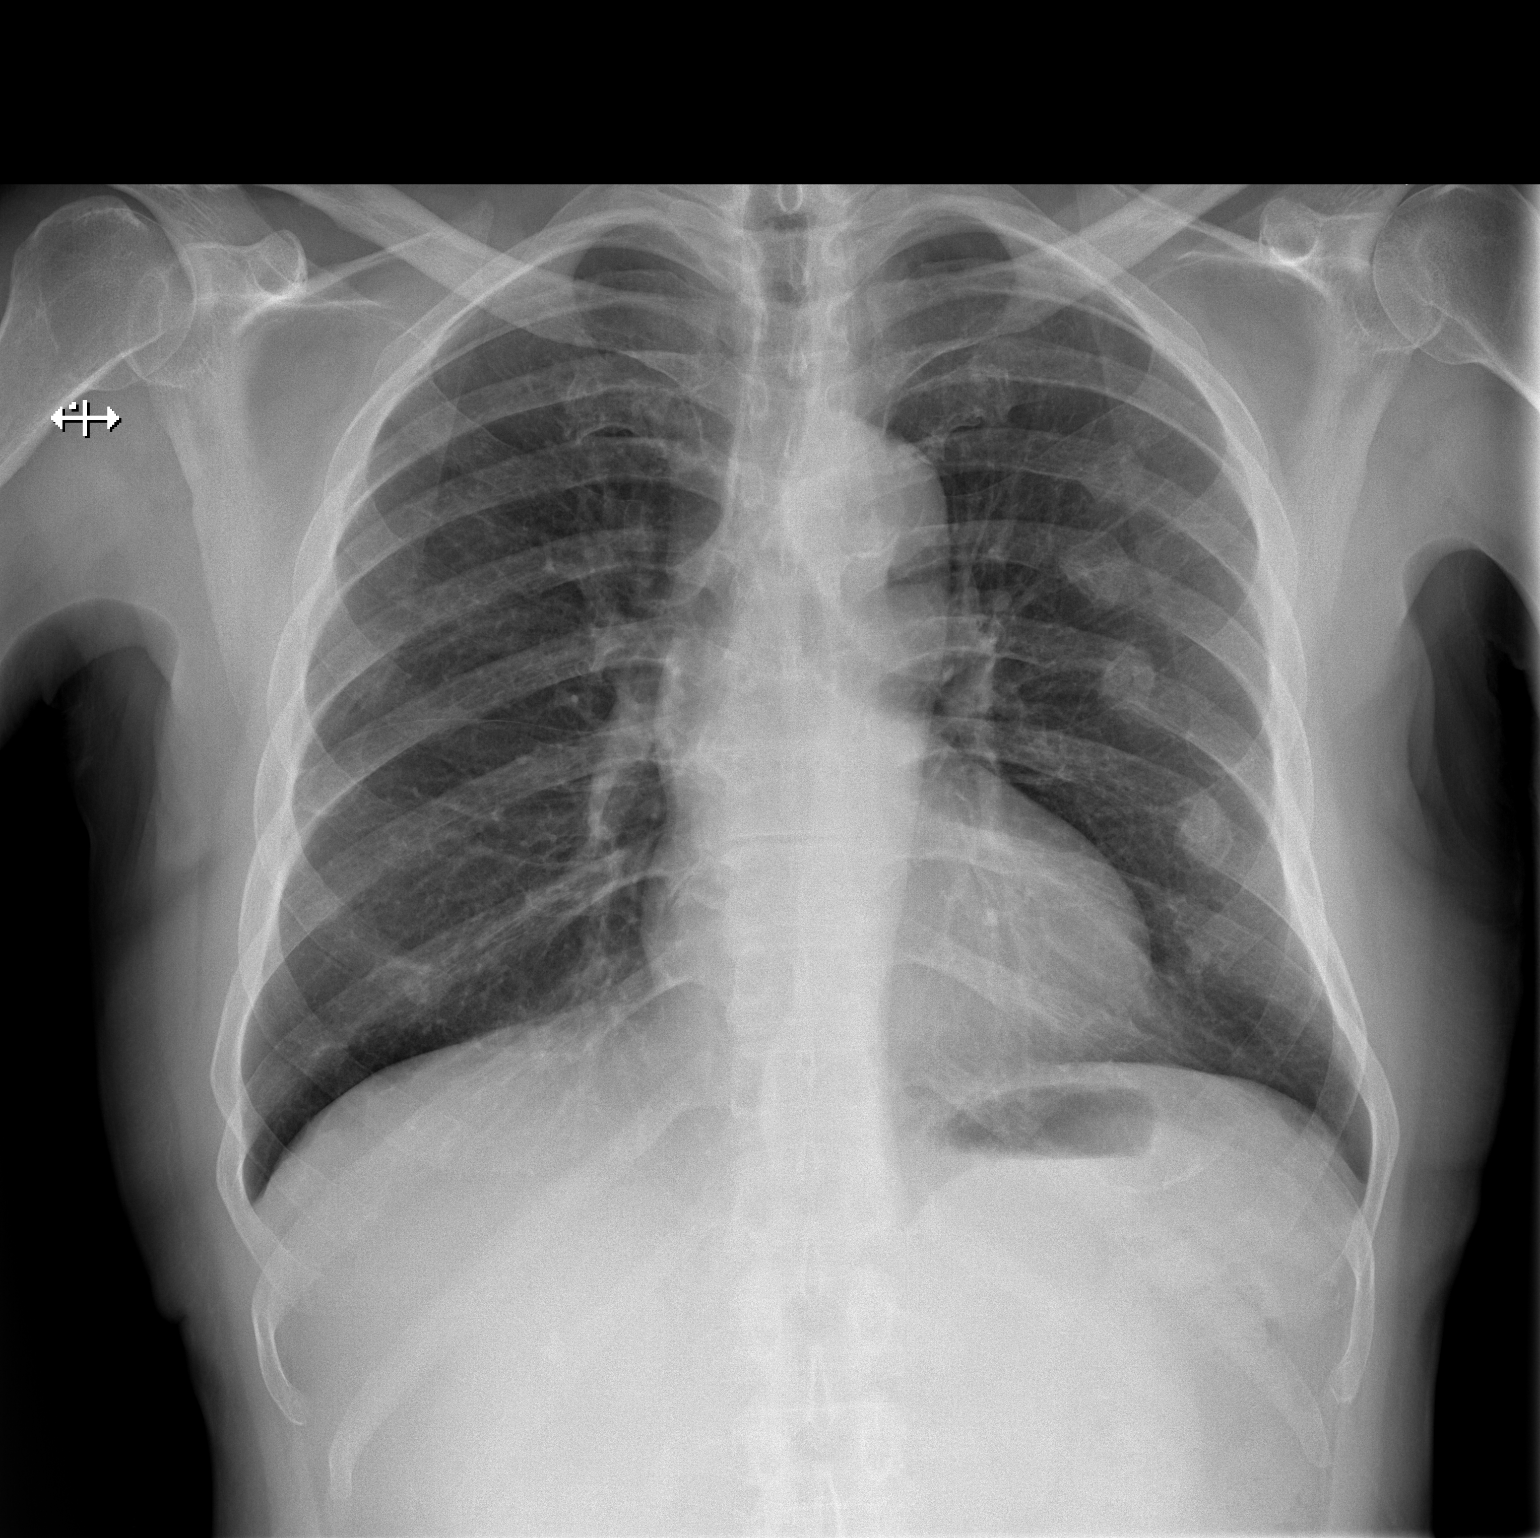

[w chest lat]
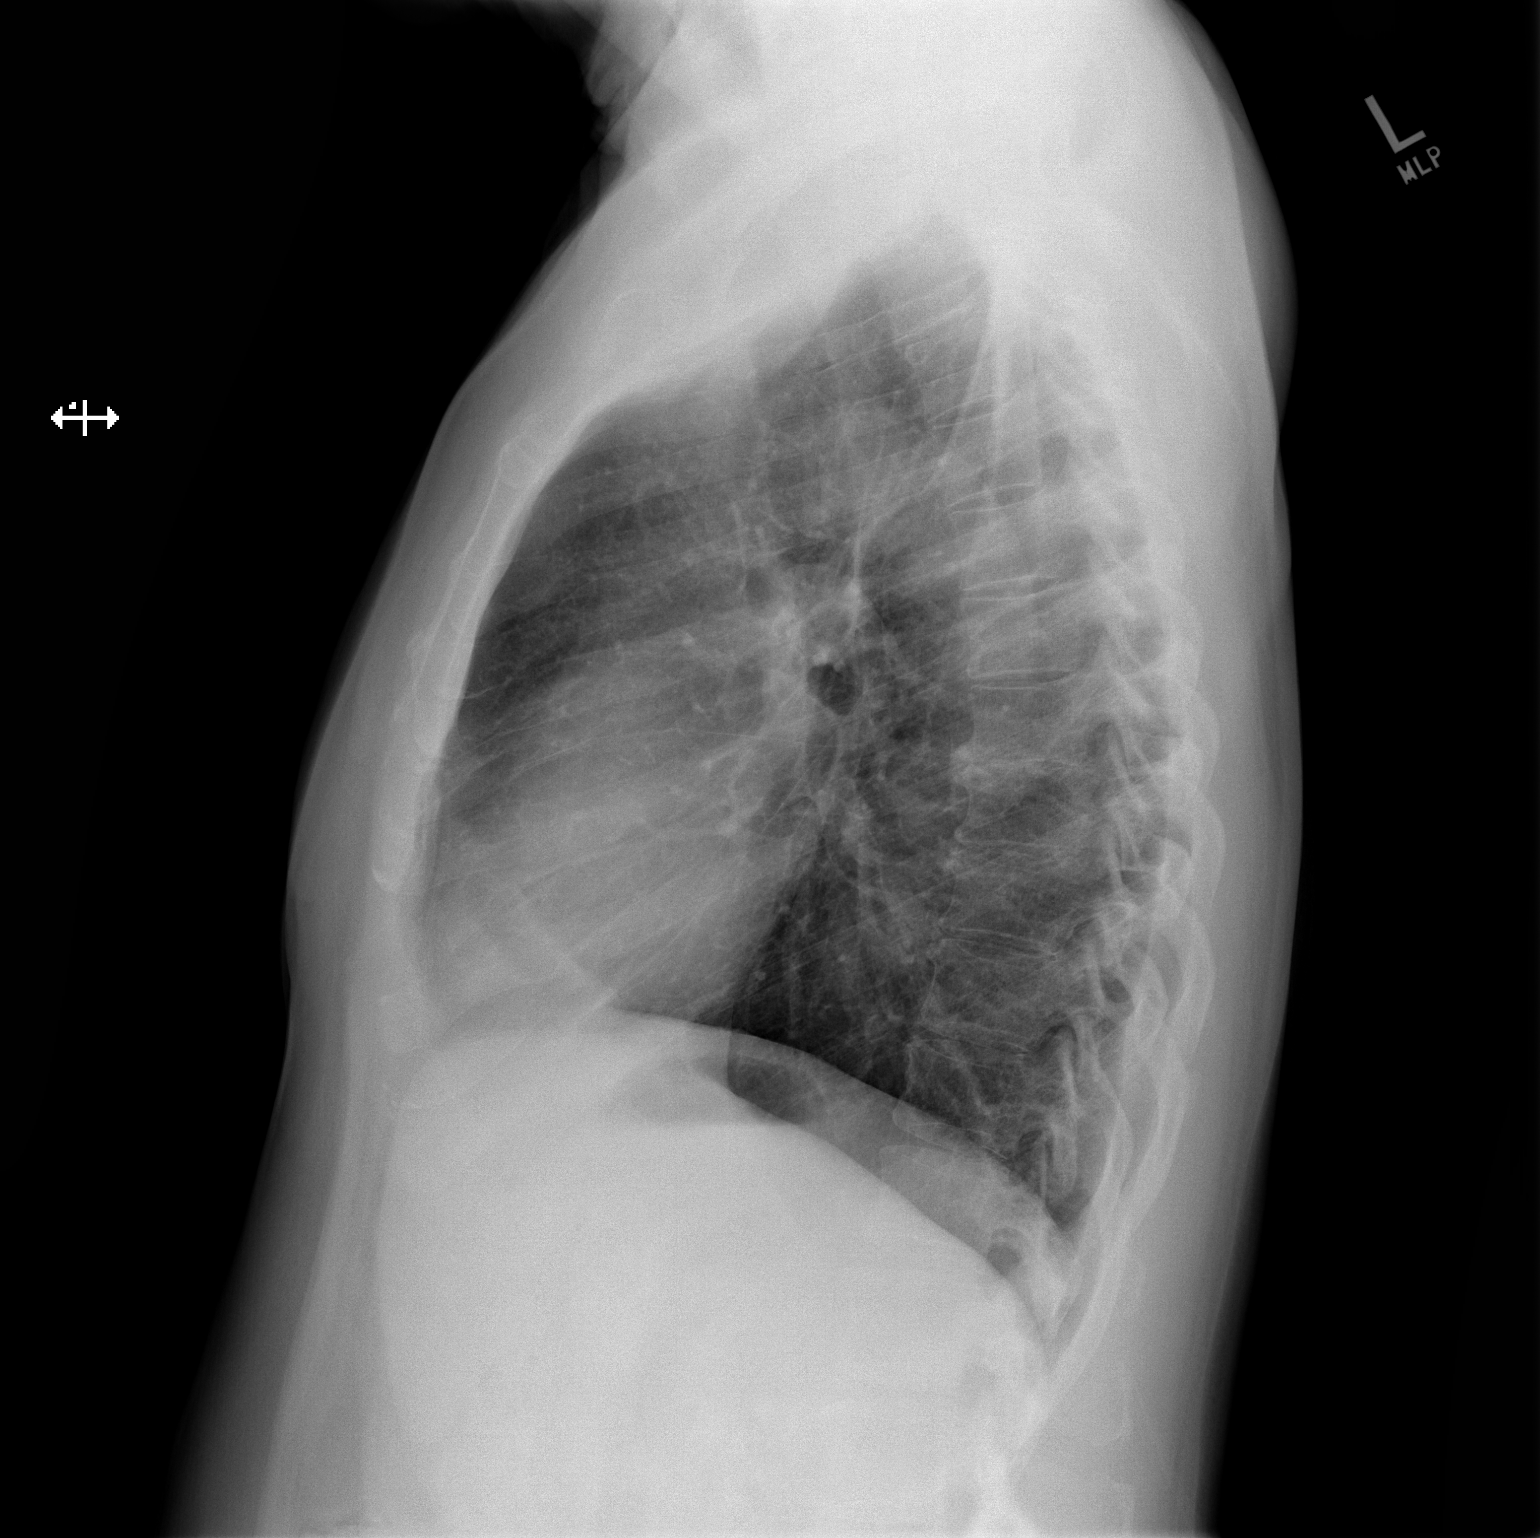

[2 of 2 positions shown; findings below may reference images not displayed]

FINDINGS: The heart size and mediastinal contours are within normal limits.
Normal pulmonary vascularity. No focal consolidation, pleural
effusion, or pneumothorax. Bilateral nipple shadows. Old left-sided
rib fractures. No acute osseous abnormality.
IMPRESSION: No active cardiopulmonary disease.

## 2019-12-29 DEATH — deceased

## 2023-05-01 DEATH — deceased
